# Patient Record
Sex: Female | Born: 1965
Health system: Southern US, Community
[De-identification: ages and names within clinical notes are randomized; demographics above are authoritative.]

## PROBLEM LIST (undated history)

## (undated) DIAGNOSIS — F32A Depression, unspecified: Secondary | ICD-10-CM

## (undated) DIAGNOSIS — F329 Major depressive disorder, single episode, unspecified: Secondary | ICD-10-CM

## (undated) DIAGNOSIS — T7840XA Allergy, unspecified, initial encounter: Secondary | ICD-10-CM

## (undated) DIAGNOSIS — L940 Localized scleroderma [morphea]: Secondary | ICD-10-CM

## (undated) DIAGNOSIS — K760 Fatty (change of) liver, not elsewhere classified: Secondary | ICD-10-CM

## (undated) DIAGNOSIS — M5030 Other cervical disc degeneration, unspecified cervical region: Secondary | ICD-10-CM

## (undated) DIAGNOSIS — E559 Vitamin D deficiency, unspecified: Secondary | ICD-10-CM

## (undated) DIAGNOSIS — D126 Benign neoplasm of colon, unspecified: Secondary | ICD-10-CM

## (undated) DIAGNOSIS — R7303 Prediabetes: Secondary | ICD-10-CM

## (undated) DIAGNOSIS — M797 Fibromyalgia: Secondary | ICD-10-CM

## (undated) DIAGNOSIS — K219 Gastro-esophageal reflux disease without esophagitis: Secondary | ICD-10-CM

## (undated) DIAGNOSIS — R569 Unspecified convulsions: Secondary | ICD-10-CM

## (undated) DIAGNOSIS — K5909 Other constipation: Secondary | ICD-10-CM

## (undated) DIAGNOSIS — E7800 Pure hypercholesterolemia, unspecified: Secondary | ICD-10-CM

## (undated) DIAGNOSIS — F419 Anxiety disorder, unspecified: Secondary | ICD-10-CM

## (undated) HISTORY — DX: Depression, unspecified: F32.A

## (undated) HISTORY — DX: Major depressive disorder, single episode, unspecified: F32.9

## (undated) HISTORY — DX: Anxiety disorder, unspecified: F41.9

## (undated) HISTORY — DX: Fatty (change of) liver, not elsewhere classified: K76.0

## (undated) HISTORY — DX: Allergy, unspecified, initial encounter: T78.40XA

## (undated) HISTORY — PX: BREAST SURGERY: SHX581

## (undated) HISTORY — DX: Other constipation: K59.09

## (undated) HISTORY — DX: Other cervical disc degeneration, unspecified cervical region: M50.30

## (undated) HISTORY — DX: Benign neoplasm of colon, unspecified: D12.6

## (undated) HISTORY — PX: PELVIC LAPAROSCOPY: SHX162

## (undated) HISTORY — DX: Vitamin D deficiency, unspecified: E55.9

## (undated) HISTORY — DX: Localized scleroderma (morphea): L94.0

## (undated) HISTORY — DX: Prediabetes: R73.03

## (undated) HISTORY — DX: Pure hypercholesterolemia, unspecified: E78.00

## (undated) HISTORY — DX: Gastro-esophageal reflux disease without esophagitis: K21.9

## (undated) HISTORY — PX: LAPAROSCOPIC CHOLECYSTECTOMY: SUR755

## (undated) HISTORY — DX: Unspecified convulsions: R56.9

## (undated) HISTORY — PX: TONSILLECTOMY AND ADENOIDECTOMY: SHX28

## (undated) HISTORY — PX: OTHER SURGICAL HISTORY: SHX169

## (undated) HISTORY — PX: VAGINAL HYSTERECTOMY: SUR661

## (undated) HISTORY — PX: LUMBAR DISC SURGERY: SHX700

## (undated) HISTORY — DX: Fibromyalgia: M79.7

---

## 1997-09-04 ENCOUNTER — Ambulatory Visit (HOSPITAL_COMMUNITY): Admission: RE | Admit: 1997-09-04 | Discharge: 1997-09-04 | Payer: Self-pay | Admitting: *Deleted

## 1998-05-15 ENCOUNTER — Emergency Department (HOSPITAL_COMMUNITY): Admission: EM | Admit: 1998-05-15 | Discharge: 1998-05-15 | Payer: Self-pay | Admitting: Emergency Medicine

## 1998-05-15 ENCOUNTER — Encounter: Payer: Self-pay | Admitting: Emergency Medicine

## 1998-05-24 ENCOUNTER — Emergency Department (HOSPITAL_COMMUNITY): Admission: EM | Admit: 1998-05-24 | Discharge: 1998-05-24 | Payer: Self-pay | Admitting: Emergency Medicine

## 1998-05-24 ENCOUNTER — Encounter: Payer: Self-pay | Admitting: Emergency Medicine

## 1998-08-23 ENCOUNTER — Emergency Department (HOSPITAL_COMMUNITY): Admission: EM | Admit: 1998-08-23 | Discharge: 1998-08-24 | Payer: Self-pay | Admitting: Emergency Medicine

## 1998-08-23 ENCOUNTER — Encounter: Payer: Self-pay | Admitting: Emergency Medicine

## 2000-03-07 ENCOUNTER — Encounter: Admission: RE | Admit: 2000-03-07 | Discharge: 2000-03-07 | Payer: Self-pay | Admitting: Family Medicine

## 2000-03-07 ENCOUNTER — Encounter: Payer: Self-pay | Admitting: Family Medicine

## 2001-10-26 ENCOUNTER — Encounter: Payer: Self-pay | Admitting: Gastroenterology

## 2001-11-28 ENCOUNTER — Other Ambulatory Visit: Admission: RE | Admit: 2001-11-28 | Discharge: 2001-11-28 | Payer: Self-pay | Admitting: Gynecology

## 2002-01-19 ENCOUNTER — Encounter: Payer: Self-pay | Admitting: Neurosurgery

## 2002-01-19 ENCOUNTER — Encounter: Admission: RE | Admit: 2002-01-19 | Discharge: 2002-01-19 | Payer: Self-pay | Admitting: Neurosurgery

## 2002-02-02 ENCOUNTER — Ambulatory Visit (HOSPITAL_COMMUNITY): Admission: RE | Admit: 2002-02-02 | Discharge: 2002-02-02 | Payer: Self-pay | Admitting: Gastroenterology

## 2002-02-02 ENCOUNTER — Encounter: Admission: RE | Admit: 2002-02-02 | Discharge: 2002-02-02 | Payer: Self-pay | Admitting: Neurosurgery

## 2002-02-02 ENCOUNTER — Encounter: Payer: Self-pay | Admitting: Neurosurgery

## 2002-02-02 ENCOUNTER — Encounter: Payer: Self-pay | Admitting: Gastroenterology

## 2002-10-26 ENCOUNTER — Encounter (INDEPENDENT_AMBULATORY_CARE_PROVIDER_SITE_OTHER): Payer: Self-pay | Admitting: *Deleted

## 2002-10-26 ENCOUNTER — Ambulatory Visit (HOSPITAL_BASED_OUTPATIENT_CLINIC_OR_DEPARTMENT_OTHER): Admission: RE | Admit: 2002-10-26 | Discharge: 2002-10-26 | Payer: Self-pay | Admitting: Otolaryngology

## 2002-12-03 ENCOUNTER — Other Ambulatory Visit: Admission: RE | Admit: 2002-12-03 | Discharge: 2002-12-03 | Payer: Self-pay | Admitting: Gynecology

## 2002-12-21 ENCOUNTER — Encounter: Payer: Self-pay | Admitting: General Surgery

## 2002-12-28 ENCOUNTER — Observation Stay (HOSPITAL_COMMUNITY): Admission: RE | Admit: 2002-12-28 | Discharge: 2002-12-29 | Payer: Self-pay | Admitting: General Surgery

## 2002-12-28 ENCOUNTER — Encounter (INDEPENDENT_AMBULATORY_CARE_PROVIDER_SITE_OTHER): Payer: Self-pay

## 2003-01-24 ENCOUNTER — Emergency Department (HOSPITAL_COMMUNITY): Admission: AD | Admit: 2003-01-24 | Discharge: 2003-01-24 | Payer: Self-pay | Admitting: Emergency Medicine

## 2003-07-02 ENCOUNTER — Inpatient Hospital Stay (HOSPITAL_COMMUNITY): Admission: RE | Admit: 2003-07-02 | Discharge: 2003-07-04 | Payer: Self-pay | Admitting: Gynecology

## 2003-07-02 ENCOUNTER — Encounter (INDEPENDENT_AMBULATORY_CARE_PROVIDER_SITE_OTHER): Payer: Self-pay | Admitting: *Deleted

## 2003-12-08 ENCOUNTER — Encounter: Admission: RE | Admit: 2003-12-08 | Discharge: 2003-12-08 | Payer: Self-pay | Admitting: Neurosurgery

## 2003-12-23 ENCOUNTER — Encounter: Admission: RE | Admit: 2003-12-23 | Discharge: 2003-12-23 | Payer: Self-pay | Admitting: Neurosurgery

## 2004-01-06 ENCOUNTER — Encounter: Admission: RE | Admit: 2004-01-06 | Discharge: 2004-01-06 | Payer: Self-pay | Admitting: Neurosurgery

## 2004-01-31 ENCOUNTER — Ambulatory Visit (HOSPITAL_COMMUNITY): Admission: RE | Admit: 2004-01-31 | Discharge: 2004-01-31 | Payer: Self-pay | Admitting: Urology

## 2004-05-19 ENCOUNTER — Other Ambulatory Visit: Admission: RE | Admit: 2004-05-19 | Discharge: 2004-05-19 | Payer: Self-pay | Admitting: Gynecology

## 2004-10-17 ENCOUNTER — Emergency Department (HOSPITAL_COMMUNITY): Admission: EM | Admit: 2004-10-17 | Discharge: 2004-10-17 | Payer: Self-pay | Admitting: Emergency Medicine

## 2005-05-03 ENCOUNTER — Ambulatory Visit (HOSPITAL_COMMUNITY): Admission: RE | Admit: 2005-05-03 | Discharge: 2005-05-03 | Payer: Self-pay | Admitting: Neurosurgery

## 2005-05-20 ENCOUNTER — Other Ambulatory Visit: Admission: RE | Admit: 2005-05-20 | Discharge: 2005-05-20 | Payer: Self-pay | Admitting: Gynecology

## 2005-06-17 ENCOUNTER — Encounter: Admission: RE | Admit: 2005-06-17 | Discharge: 2005-06-17 | Payer: Self-pay | Admitting: Neurosurgery

## 2005-10-22 ENCOUNTER — Encounter: Admission: RE | Admit: 2005-10-22 | Discharge: 2005-10-22 | Payer: Self-pay | Admitting: Neurosurgery

## 2006-04-03 ENCOUNTER — Emergency Department (HOSPITAL_COMMUNITY): Admission: EM | Admit: 2006-04-03 | Discharge: 2006-04-03 | Payer: Self-pay | Admitting: Emergency Medicine

## 2006-04-20 ENCOUNTER — Encounter: Payer: Self-pay | Admitting: Internal Medicine

## 2006-06-01 ENCOUNTER — Other Ambulatory Visit: Admission: RE | Admit: 2006-06-01 | Discharge: 2006-06-01 | Payer: Self-pay | Admitting: Gynecology

## 2006-06-14 HISTORY — PX: BACK SURGERY: SHX140

## 2006-11-09 ENCOUNTER — Encounter: Payer: Self-pay | Admitting: Internal Medicine

## 2006-11-14 ENCOUNTER — Inpatient Hospital Stay (HOSPITAL_COMMUNITY): Admission: RE | Admit: 2006-11-14 | Discharge: 2006-11-16 | Payer: Self-pay | Admitting: Neurosurgery

## 2007-06-14 ENCOUNTER — Other Ambulatory Visit: Admission: RE | Admit: 2007-06-14 | Discharge: 2007-06-14 | Payer: Self-pay | Admitting: Gynecology

## 2007-07-13 ENCOUNTER — Encounter: Admission: RE | Admit: 2007-07-13 | Discharge: 2007-07-13 | Payer: Self-pay | Admitting: Neurosurgery

## 2007-07-21 ENCOUNTER — Encounter: Admission: RE | Admit: 2007-07-21 | Discharge: 2007-07-21 | Payer: Self-pay | Admitting: Neurosurgery

## 2007-09-15 ENCOUNTER — Ambulatory Visit: Payer: Self-pay | Admitting: Internal Medicine

## 2007-09-15 DIAGNOSIS — E785 Hyperlipidemia, unspecified: Secondary | ICD-10-CM | POA: Insufficient documentation

## 2007-09-15 DIAGNOSIS — R109 Unspecified abdominal pain: Secondary | ICD-10-CM | POA: Insufficient documentation

## 2007-09-19 ENCOUNTER — Encounter (INDEPENDENT_AMBULATORY_CARE_PROVIDER_SITE_OTHER): Payer: Self-pay | Admitting: *Deleted

## 2007-09-19 ENCOUNTER — Telehealth: Payer: Self-pay | Admitting: Internal Medicine

## 2007-09-25 ENCOUNTER — Ambulatory Visit: Payer: Self-pay | Admitting: Cardiology

## 2007-09-25 ENCOUNTER — Telehealth (INDEPENDENT_AMBULATORY_CARE_PROVIDER_SITE_OTHER): Payer: Self-pay | Admitting: *Deleted

## 2007-10-05 ENCOUNTER — Encounter: Admission: RE | Admit: 2007-10-05 | Discharge: 2007-10-05 | Payer: Self-pay | Admitting: Neurosurgery

## 2007-10-31 ENCOUNTER — Encounter: Admission: RE | Admit: 2007-10-31 | Discharge: 2007-10-31 | Payer: Self-pay | Admitting: Neurosurgery

## 2007-11-23 ENCOUNTER — Ambulatory Visit: Payer: Self-pay | Admitting: Internal Medicine

## 2007-11-23 DIAGNOSIS — M542 Cervicalgia: Secondary | ICD-10-CM | POA: Insufficient documentation

## 2007-11-27 LAB — CONVERTED CEMR LAB
ALT: 21 units/L (ref 0–35)
AST: 18 units/L (ref 0–37)
Albumin: 4.2 g/dL (ref 3.5–5.2)
Alkaline Phosphatase: 48 units/L (ref 39–117)
Amylase: 55 units/L (ref 27–131)
BUN: 9 mg/dL (ref 6–23)
Basophils Absolute: 0 10*3/uL (ref 0.0–0.1)
Basophils Relative: 0.4 % (ref 0.0–1.0)
Bilirubin, Direct: 0.1 mg/dL (ref 0.0–0.3)
CO2: 26 meq/L (ref 19–32)
Calcium: 9.1 mg/dL (ref 8.4–10.5)
Chloride: 106 meq/L (ref 96–112)
Cholesterol: 202 mg/dL (ref 0–200)
Creatinine, Ser: 0.5 mg/dL (ref 0.4–1.2)
Direct LDL: 127 mg/dL
Eosinophils Absolute: 0.3 10*3/uL (ref 0.0–0.7)
Eosinophils Relative: 4.5 % (ref 0.0–5.0)
GFR calc Af Amer: 174 mL/min
GFR calc non Af Amer: 144 mL/min
Glucose, Bld: 94 mg/dL (ref 70–99)
HCT: 38.1 % (ref 36.0–46.0)
HDL: 38.1 mg/dL — ABNORMAL LOW (ref >39.0)
Hemoglobin: 13 g/dL (ref 12.0–15.0)
Lipase: 19 units/L (ref 11.0–59.0)
Lymphocytes Relative: 28.3 % (ref 12.0–46.0)
MCHC: 34.2 g/dL (ref 30.0–36.0)
MCV: 93.1 fL (ref 78.0–100.0)
Monocytes Absolute: 0.8 10*3/uL (ref 0.1–1.0)
Monocytes Relative: 12.6 % — ABNORMAL HIGH (ref 3.0–12.0)
Neutro Abs: 3.3 10*3/uL (ref 1.4–7.7)
Neutrophils Relative %: 54.2 % (ref 43.0–77.0)
Platelets: 287 10*3/uL (ref 150–400)
Potassium: 4.1 meq/L (ref 3.5–5.1)
RBC: 4.09 M/uL (ref 3.87–5.11)
RDW: 11.8 % (ref 11.5–14.6)
Sodium: 138 meq/L (ref 135–145)
Total Bilirubin: 0.7 mg/dL (ref 0.3–1.2)
Total CHOL/HDL Ratio: 5.3
Total CK: 108 units/L (ref 7–177)
Total Protein: 7.2 g/dL (ref 6.0–8.3)
Triglycerides: 224 mg/dL (ref 0–149)
VLDL: 45 mg/dL — ABNORMAL HIGH (ref 0–40)
WBC: 6.1 10*3/uL (ref 4.5–10.5)

## 2008-02-23 ENCOUNTER — Ambulatory Visit: Payer: Self-pay | Admitting: Internal Medicine

## 2008-02-23 DIAGNOSIS — R002 Palpitations: Secondary | ICD-10-CM | POA: Insufficient documentation

## 2008-02-29 ENCOUNTER — Ambulatory Visit: Payer: Self-pay | Admitting: Internal Medicine

## 2008-03-04 ENCOUNTER — Encounter (INDEPENDENT_AMBULATORY_CARE_PROVIDER_SITE_OTHER): Payer: Self-pay | Admitting: *Deleted

## 2008-03-04 LAB — CONVERTED CEMR LAB
Basophils Absolute: 0 10*3/uL (ref 0.0–0.1)
Basophils Relative: 0.4 % (ref 0.0–3.0)
Cholesterol: 253 mg/dL (ref 0–200)
Direct LDL: 143 mg/dL
Eosinophils Absolute: 0.1 10*3/uL (ref 0.0–0.7)
Eosinophils Relative: 1.2 % (ref 0.0–5.0)
HCT: 37.4 % (ref 36.0–46.0)
HDL: 50.7 mg/dL (ref >39.0)
Hemoglobin: 13.2 g/dL (ref 12.0–15.0)
Lymphocytes Relative: 37.3 % (ref 12.0–46.0)
MCHC: 35.2 g/dL (ref 30.0–36.0)
MCV: 90.8 fL (ref 78.0–100.0)
Monocytes Absolute: 0.6 10*3/uL (ref 0.1–1.0)
Monocytes Relative: 8 % (ref 3.0–12.0)
Neutro Abs: 4.3 10*3/uL (ref 1.4–7.7)
Neutrophils Relative %: 53.1 % (ref 43.0–77.0)
Platelets: 349 10*3/uL (ref 150–400)
RBC: 4.12 M/uL (ref 3.87–5.11)
RDW: 12.1 % (ref 11.5–14.6)
TSH: 1.25 microintl units/mL (ref 0.35–5.50)
Total CHOL/HDL Ratio: 5
Triglycerides: 205 mg/dL (ref 0–149)
VLDL: 41 mg/dL — ABNORMAL HIGH (ref 0–40)
WBC: 8 10*3/uL (ref 4.5–10.5)

## 2008-03-17 ENCOUNTER — Encounter: Admission: RE | Admit: 2008-03-17 | Discharge: 2008-03-17 | Payer: Self-pay | Admitting: Neurosurgery

## 2008-04-20 ENCOUNTER — Encounter: Admission: RE | Admit: 2008-04-20 | Discharge: 2008-04-20 | Payer: Self-pay | Admitting: Neurosurgery

## 2008-04-24 ENCOUNTER — Ambulatory Visit: Payer: Self-pay | Admitting: Internal Medicine

## 2008-04-29 ENCOUNTER — Encounter (INDEPENDENT_AMBULATORY_CARE_PROVIDER_SITE_OTHER): Payer: Self-pay | Admitting: *Deleted

## 2008-05-03 ENCOUNTER — Ambulatory Visit: Payer: Self-pay | Admitting: Cardiovascular Disease

## 2008-05-23 ENCOUNTER — Ambulatory Visit: Payer: Self-pay | Admitting: Family Medicine

## 2008-05-23 LAB — CONVERTED CEMR LAB
Bilirubin Urine: NEGATIVE
Glucose, Urine, Semiquant: NEGATIVE
Ketones, urine, test strip: NEGATIVE
Nitrite: NEGATIVE
Protein, U semiquant: NEGATIVE
Specific Gravity, Urine: 1.01
Urobilinogen, UA: 0.2
WBC Urine, dipstick: NEGATIVE
pH: 6

## 2008-05-24 ENCOUNTER — Ambulatory Visit: Payer: Self-pay | Admitting: Cardiology

## 2008-05-24 ENCOUNTER — Encounter: Payer: Self-pay | Admitting: Family Medicine

## 2008-05-27 ENCOUNTER — Telehealth (INDEPENDENT_AMBULATORY_CARE_PROVIDER_SITE_OTHER): Payer: Self-pay | Admitting: *Deleted

## 2008-05-27 LAB — CONVERTED CEMR LAB
ALT: 26 units/L (ref 0–35)
AST: 22 units/L (ref 0–37)
Albumin: 4.3 g/dL (ref 3.5–5.2)
Alkaline Phosphatase: 48 units/L (ref 39–117)
Amylase: 82 units/L (ref 27–131)
BUN: 8 mg/dL (ref 6–23)
Basophils Absolute: 0 10*3/uL (ref 0.0–0.1)
Basophils Relative: 0 % (ref 0.0–3.0)
Bilirubin, Direct: 0.1 mg/dL (ref 0.0–0.3)
CO2: 25 meq/L (ref 19–32)
Calcium: 9.2 mg/dL (ref 8.4–10.5)
Chloride: 104 meq/L (ref 96–112)
Creatinine, Ser: 0.4 mg/dL (ref 0.4–1.2)
Eosinophils Absolute: 0.1 10*3/uL (ref 0.0–0.7)
Eosinophils Relative: 1.1 % (ref 0.0–5.0)
GFR calc Af Amer: 225 mL/min
GFR calc non Af Amer: 186 mL/min
Glucose, Bld: 86 mg/dL (ref 70–99)
HCT: 36.9 % (ref 36.0–46.0)
Hemoglobin: 12.9 g/dL (ref 12.0–15.0)
Lipase: 32 units/L (ref 11.0–59.0)
Lymphocytes Relative: 29.6 % (ref 12.0–46.0)
MCHC: 35 g/dL (ref 30.0–36.0)
MCV: 92.1 fL (ref 78.0–100.0)
Monocytes Absolute: 0.5 10*3/uL (ref 0.1–1.0)
Monocytes Relative: 5.7 % (ref 3.0–12.0)
Neutro Abs: 5.3 10*3/uL (ref 1.4–7.7)
Neutrophils Relative %: 63.6 % (ref 43.0–77.0)
Platelets: 308 10*3/uL (ref 150–400)
Potassium: 4.1 meq/L (ref 3.5–5.1)
RBC: 4.01 M/uL (ref 3.87–5.11)
RDW: 11.7 % (ref 11.5–14.6)
Sodium: 136 meq/L (ref 135–145)
Total Bilirubin: 0.5 mg/dL (ref 0.3–1.2)
Total Protein: 7.5 g/dL (ref 6.0–8.3)
WBC: 8.4 10*3/uL (ref 4.5–10.5)

## 2008-06-04 ENCOUNTER — Encounter (INDEPENDENT_AMBULATORY_CARE_PROVIDER_SITE_OTHER): Payer: Self-pay | Admitting: *Deleted

## 2008-06-04 ENCOUNTER — Ambulatory Visit: Payer: Self-pay | Admitting: Gastroenterology

## 2008-06-19 ENCOUNTER — Ambulatory Visit: Payer: Self-pay | Admitting: Gastroenterology

## 2008-08-07 ENCOUNTER — Other Ambulatory Visit: Admission: RE | Admit: 2008-08-07 | Discharge: 2008-08-07 | Payer: Self-pay | Admitting: Gynecology

## 2008-08-07 ENCOUNTER — Ambulatory Visit: Payer: Self-pay | Admitting: Gynecology

## 2008-08-07 ENCOUNTER — Encounter: Payer: Self-pay | Admitting: Internal Medicine

## 2008-08-23 ENCOUNTER — Encounter: Payer: Self-pay | Admitting: Internal Medicine

## 2008-10-22 ENCOUNTER — Ambulatory Visit: Payer: Self-pay | Admitting: Internal Medicine

## 2008-10-22 DIAGNOSIS — M549 Dorsalgia, unspecified: Secondary | ICD-10-CM | POA: Insufficient documentation

## 2008-10-22 DIAGNOSIS — J309 Allergic rhinitis, unspecified: Secondary | ICD-10-CM | POA: Insufficient documentation

## 2008-10-29 ENCOUNTER — Encounter: Admission: RE | Admit: 2008-10-29 | Discharge: 2008-10-29 | Payer: Self-pay | Admitting: Neurosurgery

## 2008-10-29 LAB — CONVERTED CEMR LAB
ALT: 32 units/L (ref 0–35)
AST: 26 units/L (ref 0–37)
Cholesterol: 246 mg/dL — ABNORMAL HIGH (ref 0–200)
Direct LDL: 156.2 mg/dL
HDL: 44.6 mg/dL (ref >39.00)
Total CHOL/HDL Ratio: 6
Triglycerides: 254 mg/dL — ABNORMAL HIGH (ref 0.0–149.0)
VLDL: 50.8 mg/dL — ABNORMAL HIGH (ref 0.0–40.0)

## 2008-11-06 ENCOUNTER — Ambulatory Visit: Payer: Self-pay | Admitting: Internal Medicine

## 2008-11-06 DIAGNOSIS — K3189 Other diseases of stomach and duodenum: Secondary | ICD-10-CM | POA: Insufficient documentation

## 2008-11-06 DIAGNOSIS — R1013 Epigastric pain: Secondary | ICD-10-CM

## 2008-11-06 LAB — CONVERTED CEMR LAB
Bilirubin Urine: NEGATIVE
Blood in Urine, dipstick: NEGATIVE
Glucose, Urine, Semiquant: NEGATIVE
Ketones, urine, test strip: NEGATIVE
Nitrite: NEGATIVE
Protein, U semiquant: NEGATIVE
Specific Gravity, Urine: 1.02
Urobilinogen, UA: 0.2
WBC Urine, dipstick: NEGATIVE
pH: 5

## 2008-11-14 LAB — CONVERTED CEMR LAB
ALT: 33 units/L (ref 0–35)
AST: 29 units/L (ref 0–37)
Albumin: 4.3 g/dL (ref 3.5–5.2)
Alkaline Phosphatase: 54 units/L (ref 39–117)
BUN: 9 mg/dL (ref 6–23)
Basophils Absolute: 0 10*3/uL (ref 0.0–0.1)
Basophils Relative: 0.2 % (ref 0.0–3.0)
Bilirubin, Direct: 0.1 mg/dL (ref 0.0–0.3)
CO2: 29 meq/L (ref 19–32)
Calcium: 8.9 mg/dL (ref 8.4–10.5)
Chloride: 107 meq/L (ref 96–112)
Creatinine, Ser: 0.5 mg/dL (ref 0.4–1.2)
Eosinophils Absolute: 0.3 10*3/uL (ref 0.0–0.7)
Eosinophils Relative: 3 % (ref 0.0–5.0)
GFR calc non Af Amer: 142.9 mL/min (ref >60)
Glucose, Bld: 80 mg/dL (ref 70–99)
HCT: 38.8 % (ref 36.0–46.0)
Hemoglobin: 13.4 g/dL (ref 12.0–15.0)
Lymphocytes Relative: 40.8 % (ref 12.0–46.0)
Lymphs Abs: 3.7 10*3/uL (ref 0.7–4.0)
MCHC: 34.4 g/dL (ref 30.0–36.0)
MCV: 91.5 fL (ref 78.0–100.0)
Monocytes Absolute: 0.9 10*3/uL (ref 0.1–1.0)
Monocytes Relative: 10.4 % (ref 3.0–12.0)
Neutro Abs: 4.2 10*3/uL (ref 1.4–7.7)
Neutrophils Relative %: 45.6 % (ref 43.0–77.0)
Platelets: 364 10*3/uL (ref 150.0–400.0)
Potassium: 3.8 meq/L (ref 3.5–5.1)
RBC: 4.25 M/uL (ref 3.87–5.11)
RDW: 11.6 % (ref 11.5–14.6)
Sodium: 140 meq/L (ref 135–145)
Total Bilirubin: 0.6 mg/dL (ref 0.3–1.2)
Total Protein: 7.8 g/dL (ref 6.0–8.3)
WBC: 9.1 10*3/uL (ref 4.5–10.5)

## 2008-12-04 ENCOUNTER — Ambulatory Visit: Payer: Self-pay | Admitting: Internal Medicine

## 2008-12-04 DIAGNOSIS — J019 Acute sinusitis, unspecified: Secondary | ICD-10-CM | POA: Insufficient documentation

## 2008-12-10 ENCOUNTER — Ambulatory Visit: Payer: Self-pay | Admitting: Gastroenterology

## 2009-01-13 ENCOUNTER — Ambulatory Visit (HOSPITAL_COMMUNITY): Admission: RE | Admit: 2009-01-13 | Discharge: 2009-01-14 | Payer: Self-pay | Admitting: Neurosurgery

## 2009-06-23 ENCOUNTER — Emergency Department (HOSPITAL_COMMUNITY): Admission: EM | Admit: 2009-06-23 | Discharge: 2009-06-23 | Payer: Self-pay | Admitting: Emergency Medicine

## 2009-09-15 ENCOUNTER — Other Ambulatory Visit: Admission: RE | Admit: 2009-09-15 | Discharge: 2009-09-15 | Payer: Self-pay | Admitting: Gynecology

## 2009-09-15 ENCOUNTER — Ambulatory Visit: Payer: Self-pay | Admitting: Gynecology

## 2009-12-06 ENCOUNTER — Encounter: Admission: RE | Admit: 2009-12-06 | Discharge: 2009-12-06 | Payer: Self-pay | Admitting: Neurosurgery

## 2009-12-25 ENCOUNTER — Encounter: Admission: RE | Admit: 2009-12-25 | Discharge: 2009-12-25 | Payer: Self-pay | Admitting: Neurosurgery

## 2010-07-05 ENCOUNTER — Encounter: Payer: Self-pay | Admitting: Neurosurgery

## 2010-07-12 LAB — CONVERTED CEMR LAB
BUN: 11 mg/dL
CO2, serum: 23 mmol/L
Chloride, Serum: 104 mmol/L
Creatinine, Ser: 0.58 mg/dL
Hemoglobin: 12.5 g/dL
Platelets: 416 10*3/uL
Potassium, serum: 4.7 mmol/L
RBC count: 4.39 10*6/uL
Sodium, serum: 140 mmol/L
WBC, blood: 7.1 10*3/uL

## 2010-07-16 NOTE — Assessment & Plan Note (Signed)
Summary: acute only for abdominal and back pain//ph   Vital Signs:  Patient Profile:   45 Years Old Female Height:     60 inches Weight:      160.4 pounds Pulse rate:   90 / minute BP sitting:   126 / 80  (left arm)  Vitals Entered By: Doristine Devoid (May 23, 2008 3:05 PM)                 Chief Complaint:  abdominal pain and lower back- no burning or frequency.  History of Present Illness: 45 yo woman w/ abd pain for >1 yr.  Pain is located around the umbilicus and on either side.  Pain is getting worse  Occasional the pain goes through to the lower back.  Pain in the back is described as a throbbing.  Abdominal pain is described as a pressure, worse when pt pushes on abdomen.  + bloating.  No gas.  Regular BMs, last this AM.  No N/V.  the pain and bloating worsens w/ eating.  Is not relieved w/ BM or passing gas.   No gallbladder or uterus.  No dysuria, hematuria, frequency, urgency.  Pt felt that there was burning in her throat the other day- this resolved spontaneously.  Pain is worse after spicy foods.  Pt worried b/c sister has colon cancer (age 61) and sxs are becoming constant and more severe.    Current Allergies (reviewed today): No known allergies   Past Medical History:    Reviewed history from 05/03/2008 and no changes required:       G2 P2       Hyperlipidemia       ?neck mass--CT 4-09 neg       Palpitations   Family History:    CAD - no    HTN - no    DM - no    Colon Ca - sister (age 53)    breast Ca - no    stroke - F    Mother-alive and well    Father-died at 45yo, unsure of cause    Healthy brother and sister    no premature CAD or SCD in family    Review of Systems  The patient denies anorexia, fever, weight loss, chest pain, dyspnea on exertion, peripheral edema, melena, hematochezia, severe indigestion/heartburn, and enlarged lymph nodes.     Physical Exam  General:     alert and well-developed.   Neck:     no thyromegaly.     Lungs:     normal respiratory effort, no intercostal retractions, no accessory muscle use, and normal breath sounds.   Heart:     normal rate, regular rhythm, and no murmur.   Abdomen:     soft, TTP in periumbilical region.  no guarding or rebound.  + BS.  no CVA tenderness    Impression & Recommendations:  Problem # 1:  ABDOMINAL PAIN (ICD-789.00) Assessment: Deteriorated Pt w/ persistant sxs for >1 yr, worsening in frequency and severity.  Will check labs to R/O pancreatitis or hepatic dz.  Will refer to GI given sister's current dx of colon CA at age 12.  Will also do CT scan.  red flags given to pt that should prompt return.  Pt expresses understanding and is in agreement w/ this plan. Orders: UA Dipstick w/o Micro (manual) (16109) Specimen Handling (99000) Venipuncture (60454) T-Culture, Urine (09811-91478) TLB-BMP (Basic Metabolic Panel-BMET) (80048-METABOL) TLB-CBC Platelet - w/Differential (85025-CBCD) TLB-Hepatic/Liver Function Pnl (80076-HEPATIC) TLB-Amylase (82150-AMYL)  TLB-Lipase (83690-LIPASE) Radiology Referral (Radiology) Gastroenterology Referral (GI)   Complete Medication List: 1)  Vitamin D 50,000 Iu  .Marland Kitchen.. 1 by mouth once daily x 12 weeks 2)  Caltrate 600 1500 Mg Tabs (Calcium carbonate) 3)  Hydrocodone-acetaminophen 5-500 Mg Tabs (Hydrocodone-acetaminophen) .... Per dr cram 4)  Omeprazole 40 Mg Cpdr (Omeprazole) .Marland Kitchen.. 1 tab by mouth daily   Patient Instructions: 1)  Please schedule a follow-up appointment in 2 weeks with Dr Drue Novel. 2)  Take the omeprazole daily to decrease your stomach acid 3)  Increase your fiber intake- fruits, leafy green vegetables, whole grains 4)  Someone will call you to arrange your CT scan and your GI appt 5)  We will notify you of your lab results 6)  Call with any questions or concerns   Prescriptions: OMEPRAZOLE 40 MG CPDR (OMEPRAZOLE) 1 tab by mouth daily  #30 x 1   Entered and Authorized by:   Neena Rhymes MD   Signed  by:   Neena Rhymes MD on 05/23/2008   Method used:   Print then Give to Patient   RxID:   [id number]  ] Laboratory Results   Urine Tests    Routine Urinalysis   Glucose: negative   (Normal Range: Negative) Bilirubin: negative   (Normal Range: Negative) Ketone: negative   (Normal Range: Negative) Spec. Gravity: 1.010   (Normal Range: 1.003-1.035) Blood: trace-intact   (Normal Range: Negative) pH: 6.0   (Normal Range: 5.0-8.0) Protein: negative   (Normal Range: Negative) Urobilinogen: 0.2   (Normal Range: 0-1) Nitrite: negative   (Normal Range: Negative) Leukocyte Esterace: negative   (Normal Range: Negative)

## 2010-07-16 NOTE — Assessment & Plan Note (Signed)
Summary: 2 MONTH ROA///SPH   Vital Signs:  Patient Profile:   45 Years Old Female Height:     60 inches Weight:      159.6 pounds Temp:     99.2 degrees F Pulse rate:   100 / minute BP sitting:   130 / 80  Vitals Entered By: Shary Decamp (April 24, 2008 3:01 PM)                 Chief Complaint:  ROV.  History of Present Illness: f/u from last OV ---cont. w/ palpitations as before ---cont.  w/ "feeling hot" on-off ---L shoulder pain x 3 days,change w/ certain movements,no injury ---has a lesion at L mouth    Updated Prior Medication List: * VITAMIN D 50,000 IU 1 by mouth once daily x 12 weeks CALTRATE 600 1500 MG  TABS (CALCIUM CARBONATE)  HYDROCODONE-ACETAMINOPHEN 5-500 MG TABS (HYDROCODONE-ACETAMINOPHEN) PER DR CRAM  Current Allergies (reviewed today): No known allergies   Past Medical History:    Reviewed history from 11/23/2007 and no changes required:       G2 P2       Hyperlipidemia       ?neck mass--CT 4-09 neg  Past Surgical History:    Reviewed history from 02/23/2008 and no changes required:       Hysterectomy, no oophorectomy       Lumbar laminectomy--2006 and revised // fusion2008 Dr Wynetta Emery       Tubal ligation       Tonsillectomy       breast reduction       Cholecystectomy--2004     Review of Systems  General      cont. w/ wt gain   Physical Exam  General:     alert and well-developed.   Mouth:     2 mm white patch in the internal side of the L cheek Lungs:     normal respiratory effort, no intercostal retractions, no accessory muscle use, and normal breath sounds.   Heart:     normal rate, regular rhythm, and no murmur.   Msk:     shoulders: no deformities, L shoulder w/ ROM normal but painful to abeduction Extremities:     no pretibial edema bilaterally     Impression & Recommendations:  Problem # 1:  PALPITATIONS (ICD-785.1) cont. w/ palpitation to cards, likely will need a ECHO, ?holter  recent CBC and TSH were  neg also rec to talk w/  gyn ref hot flashes Orders: Cardiology Referral (Cardiology)   Problem # 2:  aphtous ulcer:  rec observation  Problem # 3:  HYPERLIPIDEMIA (ICD-272.4) admits to poor life style, we discussed diet and exercise extensively declined nutritionist referal RTC  6 months  Problem # 4:  Shoulder pain  see instructions   Complete Medication List: 1)  Vitamin D 50,000 Iu  .Marland Kitchen.. 1 by mouth once daily x 12 weeks 2)  Caltrate 600 1500 Mg Tabs (Calcium carbonate) 3)  Hydrocodone-acetaminophen 5-500 Mg Tabs (Hydrocodone-acetaminophen) .... Per dr cram   Patient Instructions: 1)  Take 400-600mg  of Ibuprofen (Advil, Motrin) with food every 4-6 hours as needed for relief of pain . Stop ibuprofen if it irritate your stomach   ]

## 2010-07-16 NOTE — Letter (Signed)
Summary: Primary Care Consult Scheduled Letter  [facility]  [address] [place], [place] [address]   Phone: [phone]  Fax: [phone]      04/29/2008 MRN: [id number]  [name] [address] [place], [place]  [address]    Dear [name],      We have scheduled an appointment for you.  At the recommendation of Dr.Paz, we have scheduled you a consult with Dr. Clifton James at [facility] on November 20th at 11:30am.  Their address is [address] [place]. [address] [place]. The office phone number is [phone].  If this appointment day and time is not convenient for you, please feel free to call the office of the doctor you are being referred to at the number listed above and reschedule the appointment.     It is important for you to keep your scheduled appointments. We are here to make sure you are given good patient care. If you have questions or you have made changes to your appointment, please notify us at  [phone], ask for Tiffany.    Thank you,  Patient Care Coordinator [place] [facility]

## 2010-07-16 NOTE — Progress Notes (Signed)
Summary: labs and ct results-lmom  Phone Note Outgoing Call   Call placed by: Doristine Devoid,  May 27, 2008 3:29 PM Call placed to: Patient Summary of Call: please notify pt that CT is WNL- no areas of concern or anything that would explain her abdominal pain.  labs normal..................Marland KitchenDoristine Devoid  May 27, 2008 3:30 PM   left msg w/ female to have return call says to call around 3:30.......Marland KitchenDoristine Devoid  May 29, 2008 10:31 AM   Follow-up for Phone Call        mailed letter.........Marland KitchenDoristine Devoid  June 04, 2008 1:10 PM

## 2010-07-16 NOTE — Assessment & Plan Note (Signed)
Summary: RO4--6 MONTH OV//PH   Vital Signs:  Patient profile:   45 year old female Height:      60 inches Weight:      161.8 pounds BMI:     31.71 Pulse rate:   94 / minute Pulse rhythm:   regular BP sitting:   110 / 60  (left arm) Cuff size:   regular  Vitals Entered By: Shary Decamp (Oct 22, 2008 11:13 AM)  History of Present Illness: ROV f/u on cholesterol chart reviewed , PMH updated   Current Medications (verified): 1)  Vitamin D 50,000 Iu .Marland Kitchen.. 1 By Mouth Once Daily X 12 Weeks 2)  Caltrate 600 1500 Mg  Tabs (Calcium Carbonate) 3)  Hydrocodone-Acetaminophen 5-500 Mg Tabs (Hydrocodone-Acetaminophen) .... Per Dr Wynetta Emery 4)  Omeprazole 40 Mg Cpdr (Omeprazole) .Marland Kitchen.. 1 Tab By Mouth Daily  Allergies (verified): No Known Drug Allergies  Past History:  Past Medical History:    G2 P2    Hyperlipidemia    ?neck mass--CT 4-09 neg    Palpitations-- saw cards 11-09, they ordered a ECHO and a Holter (done?)    Cscope 1-10 Normal, repeat 2015 given FH    Fatty liver-mild per CT 12-09    Allergic rhinitis    Chronic LBP (Dr Barco--Dr Wynetta Emery)  Social History:    Reviewed history from 05/03/2008 and no changes required:       Married       2 kids       from [place]       No tobacco        No etoh       No illicit drugs       1-2 caffeinated beverages per day  Review of Systems       since last OV -- diet is no better, unableto exercise d/t chronic LBP labs and CT from 12-09 reviewed and d/w patient  also x 2-3 weeks c/o occ wheezing and SOB. In previous years she was treated w/  ventolin, patient thinks symptoms related to polen  denies fever or cough  Physical Exam  General:  alert and well-developed.   Ears:  R ear normal and L ear normal.   Nose:  some congestion Lungs:  normal respiratory effort, no intercostal retractions, and no accessory muscle use.  few ronchi w/  cough, no wheezing  Heart:  normal rate, regular rhythm, and no murmur.     Impression &  Recommendations:  Problem # 1:  HYPERLIPIDEMIA (ICD-272.4) labs, again encouraged healthy life style see previous notes, likes a generic ?pravachol (needs a 3 month supply,  will not send Rx to local  pharmacy, patient will pick it up) Orders: TLB-ALT (SGPT) (84460-ALT) TLB-AST (SGOT) (84450-SGOT) TLB-Lipid Panel (80061-LIPID) Venipuncture (14481)  Problem # 2:  ALLERGIC RHINITIS (ICD-477.9)  rec OTC Claritin also ventolin to be used as needed   Her updated medication list for this problem includes:    Claritin 10 Mg Tabs (Loratadine) .Marland Kitchen... 1 tab a day (otc) as needed for allergies  Complete Medication List: 1)  Vitamin D 50,000 Iu  .Marland Kitchen.. 1 by mouth once daily x 12 weeks 2)  Caltrate 600 1500 Mg Tabs (Calcium carbonate) 3)  Opana 10 Mg Tabs (Oxymorphone hcl) .Marland Kitchen.. 1-2 q6h per dr Abigail Miyamoto 4)  Omeprazole 40 Mg Cpdr (Omeprazole) .Marland Kitchen.. 1 tab by mouth daily 5)  Claritin 10 Mg Tabs (Loratadine) .Marland Kitchen.. 1 tab a day (otc) as needed for allergies 6)  Ventolin Hfa 108 (90 Base)  Mcg/act Aers (Albuterol sulfate) .... 2 puffs every 6 hours as needed for wheezing  Patient Instructions: 1)  Please schedule a follow-up appointment in 6 months .  Prescriptions: VENTOLIN HFA 108 (90 BASE) MCG/ACT AERS (ALBUTEROL SULFATE) 2 puffs every 6 hours as needed for wheezing  #1 x 1   Entered and Authorized by:   Nolon Rod. Zechariah Bissonnette MD   Signed by:   Nolon Rod. Mallary Kreger MD on 10/22/2008   Method used:   Print then Give to Patient   RxID:   [id number]

## 2010-07-16 NOTE — Assessment & Plan Note (Signed)
Summary: new patient to est care--tl   Vital Signs:  Patient Profile:   45 Years Old Female Height:     60 inches Weight:      157.6 pounds BMI:     30.89 Pulse rate:   80 / minute BP sitting:   102 / 80  Vitals Entered By: Shary Decamp (September 15, 2007 9:41 AM)             Comments had lipid panel recently @ [facility] - cholesterol was elevated hx kidney stones - c/o abd pain - had u/s at [facility] ..................................................................Marland KitchenShary Decamp  September 15, 2007 9:49 AM      Chief Complaint:  new pt to est - not fasting.  History of Present Illness: New pt had lipid panel 3-08 @ [facility] - cholesterol was elevated was rec to continue w/ simvastatin and add O3FA started simvastatin aprox 7-08  c/o abd pain x 1 year peri-umbilical, no assoc N-V-D, no wt. loss (has gain wt) worse w/ fatty food and to self-palpation (had GB surgery 2004 aprox) b/c hx kidney stones, previous PCP ordered a U/S x2 at [facility]  has a Knot at L neck x 3 months, slt tender, previous PCP Rx ibuprofen--no help    Updated Prior Medication List: SIMVASTATIN 80 MG  TABS (SIMVASTATIN) 1 by mouth qhs * VITAMIN D 50,000 IU 1 by mouth once daily x 12 weeks CALTRATE 600 1500 MG  TABS (CALCIUM CARBONATE)  PERCOCET 5-325 MG  TABS (OXYCODONE-ACETAMINOPHEN) 1-2 by mouth every 4-6 hours * FISH OIL   Current Allergies (reviewed today): No known allergies   Past Medical History:    G2 P2    Hyperlipidemia  Past Surgical History:    Hysterectomy    Lumbar laminectomy--2006 and revised // fusion2008 Dr Wynetta Emery    Tubal ligation    Tonsillectomy    breast reduction    Cholecystectomy--2004   Family History:    CAD - no    HTN - no    DM - no    Colon Ca - no    breast Ca - no    stroke - F  Social History:    Married    2 kids    from [place]   Risk Factors:  Tobacco use:  never Passive smoke exposure:  no Drug use:  no Alcohol use:  no Exercise:  yes  Times per week:  2   Review of Systems  ENT      no dental pain  GI      Denies bloody stools, diarrhea, nausea, and vomiting.  GU      Denies dysuria and hematuria.   Physical Exam  General:     alert and well-developed.   Eyes:     not pale Mouth:     good dentition and pharynx pink and moist.   Neck:     at R side of necl has a soft 3x2 cm mass, suoerficial Lungs:     normal respiratory effort, no intercostal retractions, no accessory muscle use, and normal breath sounds.   Heart:     normal rate, regular rhythm, and no murmur.   Abdomen:       generalize mild abd dyscomfort to plapation soft, no distention, no masses, no hepatomegaly, and no splenomegaly.      Impression & Recommendations:  Problem # 1:  HYPERLIPIDEMIA (ICD-272.4) get recors d/c O3FA RTC fasting in 2 months Her updated medication list for this problem includes:  Simvastatin 80 Mg Tabs (Simvastatin) .Marland Kitchen... 1 by mouth qhs   Problem # 2:  ABDOMINAL PAIN (ICD-789.00) etilogy? dyspepsia? obtain records   Problem # 3:  NECK MASS (ICD-784.2) mass x 3 months, area is soft b/c is persisten: will get a CT Orders: Radiology Referral (Radiology)   Complete Medication List: 1)  Simvastatin 80 Mg Tabs (Simvastatin) .Marland Kitchen.. 1 by mouth qhs 2)  Vitamin D 50,000 Iu  .Marland Kitchen.. 1 by mouth once daily x 12 weeks 3)  Caltrate 600 1500 Mg Tabs (Calcium carbonate) 4)  Percocet 5-325 Mg Tabs (Oxycodone-acetaminophen) .Marland Kitchen.. 1-2 by mouth every 4-6 hours 5)  Fish Oil   Other Orders: Tdap => 33yrs IM ([address]) Admin 1st Vaccine (09811)   Patient Instructions: 1)  ROI: labs forlas year, all XR-U/S 2)  Please schedule a follow-up appointment in 2 months.    ]  Tetanus/Td Vaccine    Vaccine Type: Tdap    Site: right deltoid    Dose: 0.5 ml    Route: IM    Given by: Shary Decamp    Exp. Date: 08/31/2009    Lot #: [id number]

## 2010-07-16 NOTE — Miscellaneous (Signed)
Summary: [facility] Previsit/prep  Clinical Lists Changes  Medications: Added new medication of MOVIPREP 100 GM  SOLR (PEG-KCL-NACL-NASULF-NA ASC-C) As per prep instructions. - Signed Rx of MOVIPREP 100 GM  SOLR (PEG-KCL-NACL-NASULF-NA ASC-C) As per prep instructions.;  #1 x 0;  Signed;  Entered by: Wyona Almas RN;  Authorized by: Louis Meckel MD;  Method used: Electronically to [id number] [address] [place], [place]  [address], Ph: [phone], Fax: [phone] Observations: Added new observation of NKA: T (06/04/2008 13:31)    Prescriptions: MOVIPREP 100 GM  SOLR (PEG-KCL-NACL-NASULF-NA ASC-C) As per prep instructions.  #1 x 0   Entered by:   Wyona Almas RN   Authorized by:   Louis Meckel MD   Signed by:   Wyona Almas RN on 06/04/2008   Method used:   Electronically to        [facility] [id number] (retail)       [address] [place], [place]  [address]       Ph: [phone]       Fax: [phone]   RxID:   [id number]

## 2010-07-16 NOTE — Assessment & Plan Note (Signed)
Summary: roa 3 months/cbs   Vital Signs:  Patient Profile:   45 Years Old Female Height:     60 inches Weight:      155.6 pounds Pulse rate:   100 / minute BP sitting:   112 / 74  Vitals Entered By: Shary Decamp (February 23, 2008 3:36 PM)                 Chief Complaint:  discuss simvastatin.  History of Present Illness: -- see last OV...pt d/c simvastatin and felt great, did not go back --also c/o plapitations: x several months, episodes last 1 hour,  described as "pulse going fast", assoc. w/  "feeling hot" type of sensation  and anxiety; has not notice any particular triggers, no new meds    Prior Medication List:  SIMVASTATIN 80 MG  TABS (SIMVASTATIN) 1 by mouth qhs * VITAMIN D 50,000 IU 1 by mouth once daily x 12 weeks CALTRATE 600 1500 MG  TABS (CALCIUM CARBONATE)  TRAMADOL HCL 50 MG  TABS (TRAMADOL HCL) 1-2 by mouth every 6 hours as needed pain   Current Allergies (reviewed today): No known allergies   Past Medical History:    Reviewed history from 11/23/2007 and no changes required:       G2 P2       Hyperlipidemia       ?neck mass--CT 4-09 neg  Past Surgical History:    Reviewed history from 09/15/2007 and no changes required:       Hysterectomy, no oophorectomy       Lumbar laminectomy--2006 and revised // fusion2008 Dr Wynetta Emery       Tubal ligation       Tonsillectomy       breast reduction       Cholecystectomy--2004     Review of Systems  CV      Denies chest pain or discomfort and swelling of feet.  Resp      Denies shortness of breath.  GU      h/o hysterectomy  Neuro      Denies headaches, seizures, and visual disturbances.      no syncope   Physical Exam  General:     alert and well-developed.   Eyes:     not pale or icteric Neck:     no thyromegaly.   Lungs:     normal respiratory effort, no intercostal retractions, no accessory muscle use, and normal breath sounds.   Heart:     normal rate, regular rhythm, no murmur,  and no gallop.   Abdomen:     soft, non-tender, no hepatomegaly, and no splenomegaly.   Extremities:     no pretibial edema bilaterally  Neurologic:     alert & oriented X3, strength normal in all extremities, and gait normal.   Psych:     Oriented X3, normally interactive, good eye contact, not anxious appearing, and not depressed appearing.      Impression & Recommendations:  Problem # 1:  HYPERLIPIDEMIA (ICD-272.4) long h/o hyperlipidemia before, lipitor caused hair to fall. Now simvastatin causes aches plan: labs to get a baseline (last FLP was while pt on meds and currently off meds x > 2 months) likely will benefit from fenofibrate +/- pravachol Her updated medication list for this problem includes:    Simvastatin 80 Mg Tabs (Simvastatin) .Marland Kitchen... 1 by mouth qhs   Problem # 2:  PALPITATIONS (ICD-785.1) Assessment: New EKG NSR, HR high normal DDX-- thyroid dz, ?menopause, anxiety, other  labs re-asses on RTC, w/u? Orders: EKG w/ Interpretation (93000)   Complete Medication List: 1)  Simvastatin 80 Mg Tabs (Simvastatin) .Marland Kitchen.. 1 by mouth qhs 2)  Vitamin D 50,000 Iu  .Marland Kitchen.. 1 by mouth once daily x 12 weeks 3)  Caltrate 600 1500 Mg Tabs (Calcium carbonate) 4)  Tramadol Hcl 50 Mg Tabs (Tramadol hcl) .Marland Kitchen.. 1-2 by mouth every 6 hours as needed pain   Patient Instructions: 1)  came back fasting: 2)  FLP Dx high cholesterol 3)  CBC TSH Dx palpitations 4)  Please schedule a follow-up appointment in 2 months.   ]  Appended Document: med list updated    Clinical Lists Changes  Medications: Removed medication of SIMVASTATIN 80 MG  TABS (SIMVASTATIN) 1 by mouth qhs

## 2010-07-16 NOTE — Miscellaneous (Signed)
Summary: labs from gyn  labs scanned into emr [facility]  August 23, 2008 4:39 PM  Clinical Lists Changes  Observations: Added new observation of PLATELETS: 416 10*3/mm3 (08/07/2008 16:38) Added new observation of HGB: 12.5 g/dL (04/54/0981 19:14) Added new observation of RBC: 4.39 10*6/mm3 (08/07/2008 16:38) Added new observation of WBC: 7.1 10*3/mm3 (08/07/2008 16:38) Added new observation of CREATININE: 0.58 mg/dL (78/29/5621 30:86) Added new observation of BUN: 11 mg/dL (57/84/6962 95:28) Added new observation of CO2 TOTAL: 23 mmol/L (08/07/2008 16:38) Added new observation of CHLORIDE: 104 mmol/L (08/07/2008 16:38) Added new observation of POTASSIUM: 4.7 mmol/L (08/07/2008 16:38) Added new observation of SODIUM: 140 mmol/L (08/07/2008 16:38)

## 2010-07-16 NOTE — Progress Notes (Signed)
Summary: ref: previous labs, U/S and CT  Phone Note Outgoing Call   Summary of Call: advise pt: 1.-CT of the neck ok, no further tests 2.- I have review her previos u/s and labs. plan is the same : RTC fasting in 2 months....................................................................Jose E. Paz MD  September 25, 2007 1:02 PM   Follow-up for Phone Call        no answer, no machine ..................................................................Marland KitchenDoristine Devoid  September 25, 2007 2:46 PM   patient aware of results ..................................................................Marland KitchenDoristine Devoid  September 25, 2007 2:50 PM

## 2010-07-16 NOTE — Assessment & Plan Note (Signed)
Summary: fasting--tl   Vital Signs:  Patient Profile:   45 Years Old Female Height:     60 inches Weight:      160 pounds Pulse rate:   80 / minute BP sitting:   100 / 70  Vitals Entered By: Shary Decamp (November 23, 2007 9:26 AM)                 Chief Complaint:  rov - fasting.  History of Present Illness: f/u cholesterol also c/o neck pain, saw Dr Wynetta Emery , he ordered a MRI, has disk problem...on PT also c/o muscle aches all over, ?worse since she started simvastatin also c/o generalise abd pain: sx started in 2007, sx is described as tender everywhere when she puts  pressure on the abdomen previous w/u neg    Updated Prior Medication List: SIMVASTATIN 80 MG  TABS (SIMVASTATIN) 1 by mouth qhs * VITAMIN D 50,000 IU 1 by mouth once daily x 12 weeks CALTRATE 600 1500 MG  TABS (CALCIUM CARBONATE)  TRAMADOL HCL 50 MG  TABS (TRAMADOL HCL) 1-2 by mouth every 6 hours as needed pain  Current Allergies (reviewed today): No known allergies   Past Medical History:    Reviewed history from 09/15/2007 and no changes required:       G2 P2       Hyperlipidemia       ?neck mass--CT 4-09 neg  Past Surgical History:    Reviewed history from 09/15/2007 and no changes required:       Hysterectomy       Lumbar laminectomy--2006 and revised // fusion2008 Dr Wynetta Emery       Tubal ligation       Tonsillectomy       breast reduction       Cholecystectomy--2004   Social History:    Reviewed history from 09/15/2007 and no changes required:       Married       2 kids       from [place]   Risk Factors: Tobacco use:  never Passive smoke exposure:  no Drug use:  no Alcohol use:  no Exercise:  yes    Times per week:  2   Review of Systems  General      Denies fever and weight loss.      + wt gain diet-- has not change or improved exercise-- "sometimes"  Resp      Denies shortness of breath.  GI      Denies bloody stools, diarrhea, nausea, and vomiting.   Physical  Exam  General:     alert, well-developed, and well-nourished.   Lungs:     normal respiratory effort, no intercostal retractions, no accessory muscle use, and normal breath sounds.   Heart:     normal rate, regular rhythm, and no murmur.   Abdomen:     soft, normal bowel sounds, no distention, no masses, no guarding, no hepatomegaly, and no splenomegaly.  slte tender to palpation    Impression & Recommendations:  Problem # 1:  HYPERLIPIDEMIA (ICD-272.4) c/o muscle aches, check CKs hold simvastatin x 4 weeks--let me know how she feels Her updated medication list for this problem includes:    Simvastatin 80 Mg Tabs (Simvastatin) .Marland Kitchen... 1 by mouth qhs  Orders: TLB-BMP (Basic Metabolic Panel-BMET) (80048-METABOL) TLB-Lipid Panel (80061-LIPID) Venipuncture (45409) TLB-CK Total Only(Creatine Kinase/CPK) (82550-CK)   Problem # 2:  ABDOMINAL PAIN (ICD-789.00) previous w/u :  u/s 11-7, 5-08--fatty liver, kidney stones B sx  atypical labs GI referal? Orders: TLB-CBC Platelet - w/Differential (85025-CBCD) TLB-Hepatic/Liver Function Pnl (80076-HEPATIC) TLB-Amylase (82150-AMYL) TLB-Lipase (83690-LIPASE)   Problem # 3:  NECK PAIN (ICD-723.1) per Dr Wynetta Emery Her updated medication list for this problem includes:    Tramadol Hcl 50 Mg Tabs (Tramadol hcl) .Marland Kitchen... 1-2 by mouth every 6 hours as needed pain   Complete Medication List: 1)  Simvastatin 80 Mg Tabs (Simvastatin) .Marland Kitchen.. 1 by mouth qhs 2)  Vitamin D 50,000 Iu  .Marland Kitchen.. 1 by mouth once daily x 12 weeks 3)  Caltrate 600 1500 Mg Tabs (Calcium carbonate) 4)  Tramadol Hcl 50 Mg Tabs (Tramadol hcl) .Marland Kitchen.. 1-2 by mouth every 6 hours as needed pain   Patient Instructions: 1)  stop simvastatin x 4 weeks 2)  when you came back let me know if that helped your muscle aches 3)  Please schedule a follow-up appointment in 3 months.   ]

## 2010-07-16 NOTE — Procedures (Signed)
Summary: Colonoscopy   Colonoscopy  Procedure date:  06/19/2008  Findings:      Location:  [facility].    Procedures Next Due Date:    Colonoscopy: 06/2013  COLONOSCOPY PROCEDURE REPORT  PATIENT:  [name], [name]  MR#:  [id number] BIRTHDATE:   02-11-1966   GENDER:   female  ENDOSCOPIST:   Barbette Hair. Arlyce Dice, MD Referred by: Helane Rima Beverely Low, M.D.  PROCEDURE DATE:  06/19/2008 PROCEDURE:  Higher-risk screening colonoscopy  colonoscopy via colostomy ASA CLASS:   Class I INDICATIONS: family history of colon cancer Sister  MEDICATIONS:    Fentanyl 75 mcg IV, Versed 6 mg IV  DESCRIPTION OF PROCEDURE:   After the risks benefits and alternatives of the procedure were thoroughly explained, informed consent was obtained.  Digital rectal exam was performed and revealed no abnormalities.   The LB [id number] endoscope was introduced through the anus and advanced to the cecum, which was identified by both the appendix and ileocecal valve, without limitations.  The quality of the prep was excellent, using MoviPrep.  The instrument was then slowly withdrawn as the colon was fully examined. <<PROCEDUREIMAGES>>                <<OLD IMAGES>>  FINDINGS:  A normal appearing cecum, ileocecal valve, and appendiceal orifice were identified. The ascending, transverse, descending, sigmoid colon, and rectum appeared unremarkable (see image1, image2, image3, image4, image5, image6, image14, image15, and image16).   Retroflexed views in the rectum revealed no abnormalities.    The scope was then withdrawn from the patient and the procedure completed.  COMPLICATIONS:   None  ENDOSCOPIC IMPRESSION:  1) Normal colon RECOMMENDATIONS:  1) Given the significant family history, the patient should have a repeat colonoscopy in 5 years  REPEAT EXAM:   In 5 year(s) for Colonoscopy.   _______________________________ Barbette Hair. Arlyce Dice, MD  CC: Helane Rima. Beverely Low, MD

## 2010-07-16 NOTE — Procedures (Signed)
Summary: EGD   EGD  Procedure date:  10/24/2001  Findings:      Findings: Normal  Location: [facility]   Patient Name: [name], [name] MRN:  Procedure Procedures: Panendoscopy (EGD) CPT: 43235.  Personnel: Endoscopist: Barbette Hair. Arlyce Dice, MD.  Indications Symptoms: Abdominal pain,  History  Pre-Exam Physical: Performed Oct 26, 2001  Entire physical exam was normal.  Exam Exam Info: Maximum depth of insertion Duodenum, intended Duodenum. Vocal cords visualized. Gastric retroflexion performed. ASA Classification: I. Tolerance: good.  Sedation Meds: Robinul 0.2 given IV. Fentanyl 50 mcg. given IV. Versed 5 mg. given IV. Cetacaine Spray 1 sprays given aerosolized.  Monitoring: BP and pulse monitoring done. Oximetry used. Supplemental O2 given at 2 Liters.  Findings - Normal: Proximal Esophagus to Duodenal 2nd Portion.   Assessment Normal examination.  Events  Unplanned Intervention: No unplanned interventions were required.  Unplanned Events: There were no complications. Plans Medication(s): H2Blocker: Cimetidine/Tagamet 400 mg BID, starting Oct 26, 2001   Scheduling: Office Visit, to [employer]. Arlyce Dice, MD, around Nov 16, 2001.  Blood Tests, CBC, amylase, CMET on Oct 26, 2001.  Ultrasound, to Abdomen and Pelvis, around Oct 29, 2001.    This report was created from the original endoscopy report, which was reviewed and signed by the above listed endoscopist.

## 2010-07-16 NOTE — Letter (Signed)
Summary: [facility]   Imported By: Lanelle Bal 08/23/2008 10:04:56  _____________________________________________________________________  External Attachment:    Type:   Image     Comment:   External Document

## 2010-07-16 NOTE — Progress Notes (Signed)
Summary: info se radiology-dr lowne  Phone Note Call from Patient Call back at Home Phone [phone]   Caller: Patient Summary of Call: patient calling back with info [facility] [phone] - [phone] [address] -  ultra sound stomache  Initial call taken by: Okey Regal Spring,  September 19, 2007 3:46 PM  Follow-up for Phone Call        PCP:  Was only seen at [facility] and Dr. Lily Peer gynecologist.  [facility] phone # [phone] and fax # [phone] ...................................................................Ardyth Man  September 19, 2007 3:52 PM  Follow-up by: Ardyth Man,  September 19, 2007 3:52 PM  Additional Follow-up for Phone Call Additional follow up Details #1::        Dr Drue Novel I got labs from [facility] and radiology reports - did you want me to request notes from gyn? ..................................................................Marland KitchenShary Decamp  September 19, 2007 4:38 PM when she came back....................................................................Mikhael Hendriks E. Uniqua Kihn MD  September 25, 2007 1:01 PM

## 2010-07-16 NOTE — Assessment & Plan Note (Signed)
Summary: sore throat/kdc   Vital Signs:  Patient profile:   45 year old female Height:      60 inches Weight:      164.6 pounds Temp:     99.5 degrees F oral BP sitting:   102 / 60  (left arm) Cuff size:   regular  Vitals Entered By: Shary Decamp (December 04, 2008 2:07 PM) CC: ACUTE Comments  - ST x 3 days  - facial pain, pressure  - HA Shary Decamp  December 04, 2008 2:08 PM    History of Present Illness: see above  Current Medications (verified): 1)  Caltrate 600 1500 Mg  Tabs (Calcium Carbonate) 2)  Opana 10 Mg Tabs (Oxymorphone Hcl) .Marland Kitchen.. 1-2 Q6h Per Dr Abigail Miyamoto 3)  Vitamin D 2000u .... Once Daily 4)  Metoclopramide Hcl 10 Mg Tabs (Metoclopramide Hcl) .Marland Kitchen.. 1 By Mouth Q Ac  Allergies (verified): No Known Drug Allergies  Past History:  Past Medical History: Reviewed history from 10/22/2008 and no changes required. G2 P2 Hyperlipidemia ?neck mass--CT 4-09 neg Palpitations-- saw cards 11-09, they ordered a ECHO and a Holter (done?) Cscope 1-10 Normal, repeat 2015 given FH Fatty liver-mild per CT 12-09 Allergic rhinitis Chronic LBP (Dr Barco--Dr Wynetta Emery)  Past Surgical History: Reviewed history from 02/23/2008 and no changes required. Hysterectomy, no oophorectomy Lumbar laminectomy--2006 and revised // fusion2008 Dr Wynetta Emery Tubal ligation Tonsillectomy breast reduction Cholecystectomy--2004  Social History: Reviewed history from 05/03/2008 and no changes required. Married 2 kids from [place] No tobacco  No etoh No illicit drugs 1-2 caffeinated beverages per day  Review of Systems General:  no fever, on ibuprofen . Resp:  Denies cough; no chest congestion. GI:  Denies nausea and vomiting. MS:  Denies muscle aches.  Physical Exam  General:  alert and well-developed.   Head:  slightly  tender to palpation @ maxilary area  face symetric  Eyes:  EOMI Nose:  congested  Mouth:  no red  Lungs:  normal respiratory effort, no intercostal retractions, no  accessory muscle use, and normal breath sounds.   Heart:  normal rate, regular rhythm, and no murmur.     Impression & Recommendations:  Problem # 1:  SINUSITIS - ACUTE-NOS (ICD-461.9)  Her updated medication list for this problem includes:    Amoxicillin 500 Mg Tabs (Amoxicillin) .Marland Kitchen... 2 by mouth two times a day  Complete Medication List: 1)  Caltrate 600 1500 Mg Tabs (Calcium carbonate) 2)  Opana 10 Mg Tabs (Oxymorphone hcl) .Marland Kitchen.. 1-2 q6h per dr Abigail Miyamoto 3)  Vitamin D 2000u  .... Once daily 4)  Metoclopramide Hcl 10 Mg Tabs (Metoclopramide hcl) .Marland Kitchen.. 1 by mouth q ac 5)  Amoxicillin 500 Mg Tabs (Amoxicillin) .... 2 by mouth two times a day 6)  Prednisone 20 Mg Tabs (Prednisone) .Marland Kitchen.. 1 by mouth once daily  Patient Instructions: 1)  Get plenty of rest, drink lots of clear liquids, and use Tylenol or Ibuprofen 2)  mucinex DM - sudafed 3)  amoxicillin, antibiotic 4)  prednisone x 5 days  5)  Return in 7-10 days if you're not better: sooner if you'er feeling worse.  Prescriptions: PREDNISONE 20 MG TABS (PREDNISONE) 1 by mouth once daily  #5 x 0   Entered and Authorized by:   Nolon Rod. Paz MD   Signed by:   Nolon Rod. Paz MD on 12/04/2008   Method used:   Electronically to        [id number] (retail)  [address]       [place], [place]  [address]       Ph: [phone]       Fax: [phone]   RxID:   [id number] AMOXICILLIN 500 MG TABS (AMOXICILLIN) 2 by mouth two times a day  #40 x 0   Entered and Authorized by:   Nolon Rod. Paz MD   Signed by:   Nolon Rod. Paz MD on 12/04/2008   Method used:   Electronically to        [id number]* (retail)       [address] [place], [place]  [address]       Ph: [phone]       Fax: [phone]   RxID:   [id number]

## 2010-07-16 NOTE — Letter (Signed)
Summary: Primary Care Consult Scheduled Letter  [facility] [place]  [address] [place], [place] [address]   Phone: [phone]  Fax: [phone]      09/19/2007 MRN: [id number]  Esmeralda Congo [address] [place], [place]  [address]    Dear Ms. Congo,      We have scheduled an appointment for you.  At the recommendation of Dr.PAZ, we have scheduled you a consult with [facility] [address] on 04.13.09 @ 11:30 CK IN 11:15.  Their phone number is [phone].  If this appointment day and time is not convenient for you, please feel free to call the office of the doctor you are being referred to at the number listed above and reschedule the appointment.     It is important for you to keep your scheduled appointments. We are here to make sure you are given good patient care. If yu have questions or you have made changes to your appointment, please notify us at  [phone], ask for [facility].    Thank you,  Patient Care Coordinator [place] [facility]

## 2010-07-16 NOTE — Assessment & Plan Note (Signed)
Summary: pain left side/alr   Vital Signs:  Patient profile:   45 year old female Height:      60 inches Weight:      164.4 pounds Temp:     98.9 degrees F oral Pulse rate:   78 / minute Pulse rhythm:   regular BP sitting:   110 / 60  (left arm) Cuff size:   regular  Vitals Entered By: Shary Decamp (Nov 06, 2008 11:08 AM) CC: ACUTE ONLY Comments  - c/o of low abd pain - radiates to her back x 1 year  - "swelling" in abd  - no N/V  - nl BM Stacia Hawks  Nov 06, 2008 11:22 AM    History of Present Illness: ACUTE ONLY  - c/o  upper  abd  bloating-dyscomfort w/ some rad.  to the L mid back x 1 year; sometimes  feels like the area is swollen   - no N/V  - nl BM, 1-2 BMs a day  trial w/ omeprazole did not help symptoms worse x few months; she has pain and sees Dr Abigail Miyamoto , on chronic narcotics, on Opana for few months   Current Medications (verified): 1)  Caltrate 600 1500 Mg  Tabs (Calcium Carbonate) 2)  Opana 10 Mg Tabs (Oxymorphone Hcl) .Marland Kitchen.. 1-2 Q6h Per Dr Abigail Miyamoto 3)  Vitamin D 2000u .... Once Daily  Allergies (verified): No Known Drug Allergies  Past History:  Past Medical History: Reviewed history from 10/22/2008 and no changes required. G2 P2 Hyperlipidemia ?neck mass--CT 4-09 neg Palpitations-- saw cards 11-09, they ordered a ECHO and a Holter (done?) Cscope 1-10 Normal, repeat 2015 given FH Fatty liver-mild per CT 12-09 Allergic rhinitis Chronic LBP (Dr Barco--Dr Wynetta Emery)  Past Surgical History: Reviewed history from 02/23/2008 and no changes required. Hysterectomy, no oophorectomy Lumbar laminectomy--2006 and revised // fusion2008 Dr Wynetta Emery Tubal ligation Tonsillectomy breast reduction Cholecystectomy--2004  Review of Systems       no fever no wt loss (actually gaining wt)  GU:  Denies dysuria and hematuria.  Physical Exam  General:  alert and overweight-appearing.   Neck:  no masses and no thyromegaly.   Lungs:  normal respiratory effort, no  intercostal retractions, no accessory muscle use, and normal breath sounds.   Heart:  normal rate, regular rhythm, and no murmur.   Abdomen:  soft, normal bowel sounds, no distention, no masses, no guarding, and no rigidity.  minimal generalize abd dyscomfort w/ palpation    Impression & Recommendations:  Problem # 1:  DYSPEPSIA, CHRONIC (ICD-536.8) chart reviewed  12-09: CT abd-pelvis neg except for fatty liver 06-2008 normal Cscope no anemia or thyroid dz s/p cholecystectomy symptoms may be related to chronic narcotics (gastric dysmotility)  Vs others  plan: labs, trial w/  reglan,to GI  (EGD Vs gastric empty study), consider call Dr Abigail Miyamoto ?change meds  also to call if decelops  reglan s/e   Orders: Venipuncture ([address]) TLB-Hepatic/Liver Function Pnl (80076-HEPATIC) TLB-CBC Platelet - w/Differential (85025-CBCD) TLB-BMP (Basic Metabolic Panel-BMET) (80048-METABOL) Gastroenterology Referral (GI)  Problem # 2:  multiple questions answer   Complete Medication List: 1)  Caltrate 600 1500 Mg Tabs (Calcium carbonate) 2)  Opana 10 Mg Tabs (Oxymorphone hcl) .Marland Kitchen.. 1-2 q6h per dr Abigail Miyamoto 3)  Vitamin D 2000u  .... Once daily 4)  Metoclopramide Hcl 10 Mg Tabs (Metoclopramide hcl) .Marland Kitchen.. 1 by mouth q ac  Other Orders: UA Dipstick w/o Micro (manual) (60454)  Patient Instructions: 1)  Please schedule a follow-up appointment in  1 month.  Prescriptions: METOCLOPRAMIDE HCL 10 MG TABS (METOCLOPRAMIDE HCL) 1 by mouth q ac  #90 x 0   Entered and Authorized by:   Nolon Rod. Paz MD   Signed by:   Nolon Rod. Paz MD on 11/06/2008   Method used:   Print then Give to Patient   RxID:   [id number]   Laboratory Results   Urine Tests    Routine Urinalysis   Glucose: negative   (Normal Range: Negative) Bilirubin: negative   (Normal Range: Negative) Ketone: negative   (Normal Range: Negative) Spec. Gravity: 1.020   (Normal Range: 1.003-1.035) Blood: negative   (Normal Range: Negative) pH: 5.0    (Normal Range: 5.0-8.0) Protein: negative   (Normal Range: Negative) Urobilinogen: 0.2   (Normal Range: 0-1) Nitrite: negative   (Normal Range: Negative) Leukocyte Esterace: negative   (Normal Range: Negative)

## 2010-07-16 NOTE — Letter (Signed)
Summary: Results Follow up Letter  [name] [place]  [address] [place], [place] [address]   Phone: [phone]  Fax: [phone]    03/04/2008 MRN: [id number]  [name] [address] [place], [place]  [address]  Dear [name],  The following are the results of your recent test(s):  Test         Result    Pap Smear:        Normal _____  Not Normal _____ Comments: ______________________________________________________ Cholesterol: LDL(Bad cholesterol):         Your goal is less than:         HDL (Good cholesterol):       Your goal is more than: Comments:  ______________________________________________________ Mammogram:        Normal _____  Not Normal _____ Comments:  ___________________________________________________________________ Hemoccult:        Normal _____  Not normal _______ Comments:    _____________________________________________________________________ Other Tests:   Attached is a copy of your lab work from 02/29/08.  Your cholesterol is a little elevated.  Please follow a low fat/low cholesterol diet.  I have enclosed some diet information for you.  Please call me if you have any questions. Alena Bills

## 2010-07-16 NOTE — Assessment & Plan Note (Signed)
Summary: chronic dyspepsia.Marland Kitchenem   History of Present Illness Visit Type: consult Primary GI MD: Melvia Heaps MD [facility] Primary Provider: Willow Ora, MD Requesting Provider: Willow Ora, MD Chief Complaint: Pt states that she has lower abd pain, with bloating, and constipation x one year but its getting worse. [name] is a pleasant 45 year old Hispanic female referred at the request of Dr. Drue Novel for abdominal discomfort.  Over the past year she has been complaining of postprandial bloating and secondary discomfort.  She has occasional pyrosis.  There is been no change in bowel habits melena or hematochezia.  She denies excess irritations or flatus.  Weight has actually increased over the past 6 months.  She feels her symptoms have worsened.  She takes narcotics chronically for her low back pain though she claims that she moves her bowels regularly.   GI Review of Systems    Reports abdominal pain and  bloating.     Location of  Abdominal pain: lower abdomen.    Denies acid reflux, belching, chest pain, dysphagia with liquids, dysphagia with solids, heartburn, loss of appetite, nausea, vomiting, vomiting blood, weight loss, and  weight gain.      Reports constipation.     Denies anal fissure, black tarry stools, change in bowel habit, diarrhea, diverticulosis, fecal incontinence, heme positive stool, hemorrhoids, irritable bowel syndrome, jaundice, light color stool, liver problems, rectal bleeding, and  rectal pain.    Current Medications (verified): 1)  Caltrate 600 1500 Mg  Tabs (Calcium Carbonate) 2)  Amoxicillin 500 Mg Tabs (Amoxicillin) .... 2 By Mouth Two Times A Day 3)  Prednisone 20 Mg Tabs (Prednisone) .Marland Kitchen.. 1 By Mouth Once Daily 4)  Vitamin D (Ergocalciferol) 50000 Unit Caps (Ergocalciferol) .... One Tablet By Mouth Every Week 5)  Opana Er 20 Mg Xr12h-Tab (Oxymorphone Hcl) .... One Tablet By Mouth Once A Week  Allergies (verified): No Known Drug Allergies  Past History:  Past  Medical History: G2 P2 Hyperlipidemia ?neck mass--CT 4-09 neg Palpitations-- saw cards 11-09, they ordered a ECHO and a Holter (done?) Cscope 1-10 Normal, repeat 2015 given FH Fatty liver-mild per CT 12-09 Allergic rhinitis Chronic LBP (Dr Barco--Dr Wynetta Emery) Kidney Stones  Past Surgical History: Reviewed history from 02/23/2008 and no changes required. Hysterectomy, no oophorectomy Lumbar laminectomy--2006 and revised // fusion2008 Dr Wynetta Emery Tubal ligation Tonsillectomy breast reduction Cholecystectomy--2004  Family History: Reviewed history from 05/23/2008 and no changes required. CAD - no HTN - no DM - no Colon Ca - sister (age 69) breast Ca - no stroke - F Mother-alive and well Father-died at 45yo, unsure of cause Healthy brother and sister no premature CAD or SCD in family  Social History: Reviewed history from 05/03/2008 and no changes required. Married 2 kids from [place] No tobacco  No etoh No illicit drugs 1-2 caffeinated beverages per day  Review of Systems       The patient complains of back pain.  The patient denies allergy/sinus, anemia, anxiety-new, arthritis/joint pain, blood in urine, breast changes/lumps, change in vision, confusion, cough, coughing up blood, depression-new, fainting, fatigue, fever, headaches-new, hearing problems, heart murmur, heart rhythm changes, itching, menstrual pain, muscle pains/cramps, night sweats, nosebleeds, pregnancy symptoms, shortness of breath, skin rash, sleeping problems, sore throat, swelling of feet/legs, swollen lymph glands, thirst - excessive , urination - excessive , urination changes/pain, urine leakage, vision changes, and voice change.         All other systems were reviewed and were negative   Vital Signs:  Patient profile:  45 year old female Height:      60 inches Weight:      161.2 pounds BMI:     31.60 BSA:     1.70 Pulse rate:   80 / minute Pulse rhythm:   regular BP sitting:   106 / 68  (left  arm) Cuff size:   regular  Vitals Entered By: Ok Anis CMA (December 10, 2008 3:48 PM)  Physical Exam  Additional Exam:  She is a little well-nourished female  skin: anicteric HEENT: normocephalic; PEERLA; no nasal or pharyngeal abnormalities neck: supple nodes: no cervical lymphadenopathy chest: clear to ausculatation and percussion heart: no murmurs, gallops, or rubs abd: soft, nontender; BS normoactive; no abdominal masses, tenderness, organomegaly rectal: deferred ext: no cynanosis, clubbing, edema skeletal: no deformities neuro: oriented x 3; no focal abnormalities    Impression & Recommendations:  Problem # 1:  DYSPEPSIA, CHRONIC (ICD-536.8) Assessment Deteriorated Symptoms are very nonspecific and are most likely due to nonulcer dyspepsia.  A TSH  in September, 2009 was normal..  She may have a component of GERD.  Her chronic narcotic use also may be contributing to hypomotility.  Recommendations #1 trial of omeprazole 20 mg a day #2 trial of Amitiza 8 micrograms b.i.d. #3 consider upper endoscopy if patient is not improved on the above medicines Prescriptions: OMEPRAZOLE 20 MG  CPDR (OMEPRAZOLE) 1 each day 30 minutes before meal  #30 x 2   Entered and Authorized by:   Louis Meckel MD   Signed by:   Louis Meckel MD on 12/10/2008   Method used:   Electronically to        [id number]* (retail)       [address] [place], [place]  [address]       Ph: [phone]       Fax: [phone]   RxID:   [id number] AMITIZA 8 MCG CAPS (LUBIPROSTONE) take one pack twice a day as needed for bloating  #20 x 2   Entered and Authorized by:   Louis Meckel MD   Signed by:   Louis Meckel MD on 12/10/2008   Method used:   Electronically to        [id number]* (retail)       [address] [place], [place]  [address]       Ph: [phone]       Fax: [phone]   RxID:   [id number] AMITIZA 8 MCG CAPS  (LUBIPROSTONE) take one tab twice a day as needed for bloating  #20 x 0   Entered and Authorized by:   Louis Meckel MD   Signed by:   Louis Meckel MD on 12/10/2008   Method used:   Electronically to        [id number]* (retail)       [address] [place], [place]  [address]       Ph: [phone]       Fax: [phone]   RxID:   [id number]

## 2010-07-16 NOTE — Letter (Signed)
Summary: Results Follow up Letter  [facility]  [address] [place], [place] [address]   Phone: [phone]  Fax: [phone]    06/04/2008 MRN: [id number]  Jourdyn Congo [address] [place], [place]  [address]  Dear Ms. Congo,  The following are the results of your recent test(s):  Test         Result    Pap Smear:        Normal _____  Not Normal _____ Comments: ______________________________________________________ Cholesterol: LDL(Bad cholesterol):         Your goal is less than:         HDL (Good cholesterol):       Your goal is more than: Comments:  ______________________________________________________ Mammogram:        Normal _____  Not Normal _____ Comments:  ___________________________________________________________________ Hemoccult:        Normal _____  Not normal _______ Comments:    _____________________________________________________________________ Other Tests: PLEASE SEE COPY OF LABS FROM 05/23/08- LABS AND CT SCAN NORMAL!    We routinely do not discuss normal results over the telephone.  If you desire a copy of the results, or you have any questions about this information we can discuss them at your next office visit.   Sincerely,

## 2010-08-30 LAB — BASIC METABOLIC PANEL
BUN: 4 mg/dL — ABNORMAL LOW (ref 6–23)
CO2: 22 mEq/L (ref 19–32)
Calcium: 8.6 mg/dL (ref 8.4–10.5)
Chloride: 100 mEq/L (ref 96–112)
Creatinine, Ser: 0.47 mg/dL (ref 0.4–1.2)
GFR calc Af Amer: >60 mL/min (ref >60)
GFR calc non Af Amer: >60 mL/min (ref >60)
Glucose, Bld: 103 mg/dL — ABNORMAL HIGH (ref 70–99)
Potassium: 3.4 mEq/L — ABNORMAL LOW (ref 3.5–5.1)
Sodium: 132 mEq/L — ABNORMAL LOW (ref 135–145)

## 2010-08-30 LAB — URINALYSIS, ROUTINE W REFLEX MICROSCOPIC
Bilirubin Urine: NEGATIVE
Glucose, UA: NEGATIVE mg/dL
Hgb urine dipstick: NEGATIVE
Ketones, ur: NEGATIVE mg/dL
Nitrite: NEGATIVE
Protein, ur: NEGATIVE mg/dL
Specific Gravity, Urine: 1.013 (ref 1.005–1.030)
Urobilinogen, UA: 0.2 mg/dL (ref 0.0–1.0)
pH: 6 (ref 5.0–8.0)

## 2010-08-30 LAB — TSH: TSH: 4.649 u[IU]/mL — ABNORMAL HIGH (ref 0.350–4.500)

## 2010-08-30 LAB — DIFFERENTIAL
Basophils Absolute: 0 10*3/uL (ref 0.0–0.1)
Basophils Relative: 0 % (ref 0–1)
Eosinophils Absolute: 0.2 10*3/uL (ref 0.0–0.7)
Eosinophils Relative: 1 % (ref 0–5)
Lymphocytes Relative: 46 % (ref 12–46)
Lymphs Abs: 5.7 10*3/uL — ABNORMAL HIGH (ref 0.7–4.0)
Monocytes Absolute: 1 10*3/uL (ref 0.1–1.0)
Monocytes Relative: 8 % (ref 3–12)
Neutro Abs: 5.5 10*3/uL (ref 1.7–7.7)
Neutrophils Relative %: 44 % (ref 43–77)

## 2010-08-30 LAB — GLUCOSE, CAPILLARY: Glucose-Capillary: 96 mg/dL (ref 70–99)

## 2010-08-30 LAB — CBC
HCT: 38.1 % (ref 36.0–46.0)
Hemoglobin: 13.3 g/dL (ref 12.0–15.0)
MCHC: 34.8 g/dL (ref 30.0–36.0)
MCV: 91.1 fL (ref 78.0–100.0)
Platelets: 338 10*3/uL (ref 150–400)
RBC: 4.18 MIL/uL (ref 3.87–5.11)
RDW: 12.4 % (ref 11.5–15.5)
WBC: 12.4 10*3/uL — ABNORMAL HIGH (ref 4.0–10.5)

## 2010-08-30 LAB — MAGNESIUM: Magnesium: 2 mg/dL (ref 1.5–2.5)

## 2010-09-20 LAB — CBC
HCT: 39.8 % (ref 36.0–46.0)
Hemoglobin: 13.9 g/dL (ref 12.0–15.0)
MCHC: 35 g/dL (ref 30.0–36.0)
MCV: 90.3 fL (ref 78.0–100.0)
Platelets: 360 10*3/uL (ref 150–400)
RBC: 4.41 MIL/uL (ref 3.87–5.11)
RDW: 12.3 % (ref 11.5–15.5)
WBC: 9.1 10*3/uL (ref 4.0–10.5)

## 2010-10-27 NOTE — Op Note (Signed)
NAME:  [name], [name]        ACCOUNT NO.:  [id number]   MEDICAL RECORD NO.:  [id number]          PATIENT TYPE:  INP   LOCATION:  2899                         FACILITY:  [facility]   PHYSICIAN:  Donalee Citrin, M.D.        DATE OF BIRTH:  Oct 26, 1965   DATE OF PROCEDURE:  01/13/2009  DATE OF DISCHARGE:                               OPERATIVE REPORT   PREOPERATIVE DIAGNOSIS:  Painful hardware.   PROCEDURE:  Exploration of her fusion, removal of hardware, L4-S1.   SURGEON:  Donalee Citrin, MD   ASSISTANT:  Tia Alert, MD   ANESTHESIA:  Endotracheal.   HISTORY OF PRESENT ILLNESS:  The patient is a very pleasant 45 year old  female who underwent a L4-S1 posterior lumbar body fusion approximately  2 years ago.  The patient had radiographic evidence of  solid bony  fusion.  However, the patient had persistent back pain with pain and  point tenderness overlying the screw heads down around L5 and S1.  This  was refractory to all forms of treatment and both MRI and CT scan showed  no new structure pathology and solid bony fusion from L4-S1, so the  patient requested removal of hardware after it was felt that the screws  were the source of most of her discomfort.  So, risks and benefits of  the operation were explained to the patient and she understood and  agreed to proceed forward.   DESCRIPTION OF PROCEDURE:  The patient was brought to the OR, induced  under general anesthesia, and positioned prone on the Wilson frame.  Back was prepped and draped in the usual sterile fashion.  Her old  incision was ellipsed in size and opened up.  The scar tissue dissected  free exposing the hardware from L4 down to S1.  The fusion was inspected  and noted to be solid.  Then, the top 2 nuts were removed.  The cross-  link was dissected free, removed the rods, removed the screws, removed  all the pedicles, screw holes were waxed, and meticulous hemostasis was  maintained.  A small Hemovac drain was placed.   The wound was copiously  irrigated and closed in layers with interrupted Vicryl and the skin was  closed with running 4-0 subcuticular.  Benzoin and Steri-Strips were  applied.  The patient went to the recovery room in stable condition.  At  the end of the case, sponge and instrument count were correct.           ______________________________  Donalee Citrin, M.D.     GC/MEDQ  D:  01/13/2009  T:  01/13/2009  Job:  [id number]

## 2010-10-27 NOTE — Op Note (Signed)
NAME:  [name], [name]        ACCOUNT NO.:  [id number]   MEDICAL RECORD NO.:  [id number]          PATIENT TYPE:  INP   LOCATION:  2899                         FACILITY:  [facility]   PHYSICIAN:  Donalee Citrin, M.D.        DATE OF BIRTH:  01/20/1966   DATE OF PROCEDURE:  DATE OF DISCHARGE:                               OPERATIVE REPORT   PREOPERATIVE DIAGNOSIS:  Left L5-S1 radiculopathy from severe  degenerative disk disease and spinal stenosis at L4-5 and L5-S1.   PROCEDURE PERFORMED:  1. Re-do decompressive laminectomy at L5-S1.  2. Decompressive laminectomy at L4-5.  3. Posterior lumbar interbody fusion L4-5 and L5-S1 using a Hybrid      Telemon peak cage and tangent allograft wedge 10 x 22 and 10 x 26      mm  respectively in the interspaces.  4. Pedicle screw fixation L4 to S1 using the 6.35 Legacy pedicle screw      system, posterolateral arthrodesis,  L4 to S1 using locally      harvested allograft mixed with Progenics bone substitute placed.   SURGEON:  Dr. Wynetta Emery.   ASSISTANT SURGEON:  Kathaleen Maser. Pool, M.D..   ANESTHESIA:  General endotracheal anesthesia.   HISTORY OF PRESENT ILLNESS:  The patient is a very pleasant 45 year old  female who is a few years out from a laminectomy microdiskectomy with  persistent recurrent left leg radiculopathy and severe mechanical and  degenerative disk disease back pain.  Imaging with MRI scan with and  without contrast and discography showed degenerative disk disease at the  operative level with recurrent spinal stenosis and lateral recess  stenosis at L5-S1, as well as annular tearing and degenerative disk  disease at L4-5.  The patient underwent discography that showed strongly  concordant responses at L4-5 and also less so but also at L5-S1.  Due to  the patient's previous surgery at L5-S1, her response to discography and  her failure with conservative treatment, the patient is recommended 2-  level re-do laminectomy and diskectomy with  interbody fusions.  The  risks and benefits of the operations were explained to the patient who  understands and agrees to proceed.   OPERATION IN DETAIL:  The patient was brought to the OR suite under  general anesthesia.  The patient was placed prone on the Wilson frame.  Her back was prepped and draped in the usual sterile fashion.  Her old  incision was opened up and extended cephalad.  Subperiosteal dissection  was carried out in the lamina of L4-5 and S1 where the scar tissue was  dissected free with a laminotomy at L5-S1 at the left side.  The 4 and 5  lamina complexes were noted to be more mobile than what would be  expected in a normal situation.  The joints were noted to be somewhat  diastased and hypermobile.  The remainder of the spinous at L5 was  removed and the complete spinous process at L4 was removed.  Complete  decompressive laminectomies and medial facetectomies were performed at  L4-5, decompressing the 4 and the 5 root out both sides.  Then, a  completed medial facetectomy was performed on the right side of L5-S1  and the scar tissue was dissected free using a combination of the 4  Penfield dental dissectors on the left side of L5-S1 and complete medial  facetectomies were performed with the residual lamina of the facet  complex at L5-S1 left.  There was noted to be an extensive amount of  scar tissue tethering the L5-S1 disk space and the S1 nerve root against  the S1 pedicle.  The L5 nerve root on the left side was also noted to be  markedly swollen and this was freed up from the underlying scar tissue  and disk space as well.   After complete decompression had begun, attention was taken to the  interbody work.  Using a D' Errico, the left L4 nerve root was reflected  medially.  The annular was __________  lumbar scalpel and the disk space  was cleaned out.  Then, a size 10 distractor was inserted.  This had  good apposition of the endplates.  It was felt to be  an appropriate size  for the graft, so a 10 x 22 mm Telemon peak cage and a 10 x 26 mm  tangent allograft wedge were all opened and prepped.  The cage was  packed with locally harvested allograft mixed with Progenics bone  substitute.  Then the right-sided interspace was cut out and prepared.  Again, fluoroscopy confirmed each step along the way.  A size 10 cutter  and chisel were used to prepare the endplate and a peak cage was  inserted on the patient's right side.  Then, the distractor was removed.  Fluoroscopy confirmed good position.  The endplates on the left were  prepared in a similar fashion.  Locally harvested allograft Progenics  was packed centrally and the left side tangent allograft wedge was  inserted.   After all the interbody work had been done on L4-5, attention was taken  to the L5-S1.  The right side distractor was inserted.  Again, the 10-mm  distractor had good apposition of the endplates.  Then the scar tissue  was additionally freed up and the left side interspace was cleaned out.  A 10 x 22 mm peak cage with locally harvested allograft mixed Progenics  was packed on the left side.  Then on the right side in a similar  fashion, the interspace was cleaned out.  During placement of the  tangent allograft, some resistance was encountered and the allograft was  displaced medially at an angle almost more of a T-lift situation.  It  was in good position from an anterior posterior perspective, but it did  displaced the peak graft slightly posteriorly.  Attempts were made to  try and get the peak further advanced in unsuccessfully.  Due to this  and the hypermobility of the peak, dorsally it was elected to remove the  peak and replace it with a tangent.  So a 10 x 26 mm tangent was then  inserted on the patient's left side.  It was still a little bit proud,  so it was drilled down until it was behind the posterior cortical surface.  The L5 and S1 neural foramen were  explored to confirm no  compression from the graft material.  This was confirmed.  Then  __________  were placed.  A pilot hole was drilled at L4 on the left  with the awl probed with a 4.5 tap.  The probe began and a 5.5 x  40  screw was inserted at L4 on the left.  Fluoroscopy at each step along  the way, as well as probing from within the pedicle and within the canal  confirmed no mediolateral breach.  The L5 and S1 screws in similar  fashion with a 5.5 x 40 at L5 and a 6.5 x 35 at S1 on the right side all  the same size screws were inserted in similar fashion without event.  After all 6 screws had been placed, the wound was copiously irrigated  and fixed with hemostasis.  Gross decortication was carried out of the  TPs and lateral gutters.  The remainder of the autograft with Progenics  was packed in the lateral gutters and 60 mm rods were inserted,  tightened down at S1.  The L5 pedicle screw was compressed against S1  and the L4 against L5.  The neural foramina were then re-explored to  confirm the migration of graft material.  The interbody spaces were  again formed to be in the same good position.  Using hockey-stick and  coronary dilators, the foramina were confirmed to be patent.  Then,  Gelfoam was overlayed on top of the dura.  A 420 crosslink was inserted  between the L4 and L5 pedicle screws.  The medium Hemovac drain was  placed.  Then postoperative films showed good position of the screws,  rods and bone graft.  Then the wound was closed in layers with  interrupted Vicryl  with a running 4-0 subcuticular in the skin.  Benzoin and Steri-Strips were applied.  The patient went to the recovery  room in stable condition.   At the end of the case, the needle count and sponge count were correct.           ______________________________  Donalee Citrin, M.D.     GC/MEDQ  D:  11/14/2006  T:  11/14/2006  Job:  [id number]

## 2010-10-27 NOTE — Assessment & Plan Note (Signed)
[facility]                            CARDIOLOGY OFFICE NOTE   MARIS, ABASCAL                 MRN:          [id number]  DATE:05/03/2008                            DOB:          April 17, 1966    PRIMARY CARE PHYSICIAN:  Willow Ora, MD   REASON FOR CONSULTATION:  Palpitations.   HISTORY OF PRESENT ILLNESS:  Ms. Congo is a pleasant 45 year old  female with past medical history significant for hyperlipidemia who  presented to her primary care physician's office earlier this week with  complaints of palpitations.  She was referred today for further  evaluation of this complaint.  She tells me that she has been in her  normal state of good health with just chronic back pain as her only  complaint.  About 6 months ago, she began to notice episodes of  irregularity of her heartbeat.  She describes this as a feeling that her  heart is racing.  These episodes still also like she may be having skip  beats and last for 2-3 seconds.  There are no associated symptoms of  shortness of breath, diaphoresis, nausea, vomiting, or dizziness.  Symptoms do not occur every day, but do occur several times per week.  The episodes only last for a few seconds and resolve on their own.  Once  again, the patient has had no episodes of dizziness, near syncope, or  syncope.  Separately, she describes occasional left arm pain, which is  not associated with chest pain, shortness of breath, diaphoresis,  nausea, or palpitations.  She has no other complaints at this time.  She  tells me that she has had no prior history of heart disease.   PAST MEDICAL HISTORY:  Hyperlipidemia.   PAST SURGICAL HISTORY:  1. Bilateral tubal ligation.  2. Hysterectomy.  3. Lumbar laminectomy in 2006 and 2008.  4. Tonsillectomy.  5. Breast reduction surgery.  6. Cholecystectomy.   ALLERGIES:  No known drug allergies.   CURRENT MEDICATIONS:  1. Caltrate 600 mg once daily.  2.  Hydrocodone/acetaminophen 5/500 mg 2-3 tablets once daily.  3. Vitamin D 1000 units once daily.   SOCIAL HISTORY:  The patient denies use of tobacco, alcohol, or illicit  drugs.  She is married and has 2 children.  She is employed as an Designer, fashion/clothing.   FAMILY HISTORY:  The patient's mother is alive and healthy.  Her father  died at age of 75, but she is not sure what he died from.  She has one  brother and one sister who are both healthy.  There is no family history  of premature coronary artery disease or sudden cardiac death.   REVIEW OF SYSTEMS:  As stated in history of present illness and is  otherwise negative.   PHYSICAL EXAMINATION:  VITALS:  Blood pressure 100/76, pulse 81 and  regular, and respirations 12.  GENERAL:  She is a pleasant young female, in no acute distress.  She is  alert and oriented x3.  PSYCHIATRIC:  Mood and affect are normal.  MUSCULOSKELETAL:  Muscle strength and tone are normal.  NEUROLOGICAL:  No focal  neurological deficits.  SKIN:  Warm and dry.  HEENT:  Normal.  NECK:  No JVD.  No carotid bruits.  No thyromegaly.  No lymphadenopathy.  LUNGS:  Clear to auscultation bilaterally without wheezes, rhonchi, or  crackles noted.  CARDIOVASCULAR:  Regular rate and rhythm without murmurs, gallops, or  rubs noted.  ABDOMEN:  Soft, nontender, and nondistended.  Bowel sounds are present.  EXTREMITIES:  No evidence of edema.  Pulses are 2+ in all extremities.   DIAGNOSTIC STUDIES:  A 12-lead EKG obtained in our office today shows  normal sinus rhythm with low-voltage QRS.  There are no ischemic changes  noted.  All intervals are within normal limits.   ASSESSMENT AND PLAN:  This is a pleasant 45 year old female with past  medical history significant for hyperlipidemia, anemia, who presents  with complaints of occasional palpitations that sounds most consistent  with premature ventricular contractions or premature atrial  contractions.  The patient denies  any episodes of near syncope or  syncope with these events.  I think it is most likely that her symptoms  are benign.  However, I think it will be reasonable to obtain a surface  echocardiogram to assess her left ventricular size and function.  I  would also like to obtain a 48-hour Holter monitor to document the  abnormality in her rhythm.  This will serve both to rule out any  malignant arrhythmias and also to document the presence of premature  beats.  She has already had testing of her thyroid function in her  primary care physician's office and this was within normal limits.  I  would like to see her back in our office in 3-4 weeks to review the  results of her Holter monitor and her echocardiogram.  She is alerted  that she should call our office if she has any change in her clinical  status.  I also have encouraged her to continue to follow with her  primary care physician.     Verne Carrow, MD  Electronically Signed    CM/MedQ  DD: 05/03/2008  DT: 05/04/2008  Job #: [id number]   cc:   Willow Ora, MD

## 2010-10-30 NOTE — Op Note (Signed)
NAME:  [name], [name]        ACCOUNT NO.:  [id number]   MEDICAL RECORD NO.:  [id number]          PATIENT TYPE:  AMB   LOCATION:  SDS                          FACILITY:  [facility]   PHYSICIAN:  Donalee Citrin, M.D.        DATE OF BIRTH:  Dec 10, 1965   DATE OF PROCEDURE:  05/03/2005  DATE OF DISCHARGE:                                 OPERATIVE REPORT   PREOPERATIVE DIAGNOSIS:  Left S1 radiculopathy from ruptured disc L5-S1  left.   PROCEDURE:  Lumbar laminectomy and microdiscectomy L5-S1 left with  microscopic dissection of left S1 nerve root, microscopic discectomy.   SURGEON:  Donalee Citrin, M.D.   ASSISTANT:  Kathaleen Maser. Pool, M.D.   ANESTHESIA:  General endotracheal anesthesia.   HISTORY OF PRESENT ILLNESS:  Patient is a pleasant 45 year old female who  has had several years of longstanding back and left leg pain radiating down  the outside of her foot and __________ with numbness and tingling in the  same distribution.  Previous of degenerative at L4-5 and L5-S1 with disc  herniation compressing the left S1 nerve root.  Pain failed all forms of  conservative treatment. Recommend laminectomy and microdiscectomy.  Risks  and benefits discussed with the patient and she understood and agreed to  proceed forward.   Patient brought to the OR where she received general anesthesia, positioned  prone on the Wilson frame.  Back prepped in the usual sterile fashion.  I  localized the L5-S1 disc space.  After infiltration with 10 mL lidocaine  with epinephrine a midline incision was made. Bovie electrocautery was used  to taken down through subcutaneous tissue, subperiosteal dissection carried  out to the lamina of L5 and S1 on the left side.  Intraoperative x-ray  confirmed localization of the L5-S1 disc space.  Then the inferior aspect of  the lamina of L5 medial facet complex superior aspect of the S1 was removed  and ligamentum flavum was removed piecemeal fashion. Then the operating  microscope was draped, brought into the field and under microscopic  illumination the S1 nerve root was identified and the S1 pedicle was  identified.  Then using a #4 Penfield, the S1 nerve root was dissected off a  large bulbous distal fragment contained within the ligament and reflected  medially with D'Errico nerve root retractor.  Then the annulotomy was made  with 11 blade scalpel.  Several large fragments of disc removed from the  central compartment.  Then using Epstein curette, pituitary rongeurs and  disc spaces were adequately cleaned out decompressing the thecal sac and S1  nerve root.  In the end, discectomy was done for stenosis of the S1 nerve  root.  Wound was copiously irrigated.  Meticulous hemostasis was maintained.  Gelfoam was __________ topically.  Muscle fascia reapproximated in layers with interrupted Vicryl and the skin  was closed with running 4-0 subcuticular.  Benzoin and Steri-Strips applied.  Patient taken to the recovery room in stable condition.  Sponge, needle and  instrument counts were correct.           ______________________________  Donalee Citrin, M.D.  GC/MEDQ  D:  05/03/2005  T:  05/03/2005  Job:  [id number]   cc:   Gabriel Earing, M.D.  Fax: [phone]

## 2010-10-30 NOTE — H&P (Signed)
NAME:  [name], [name]                   ACCOUNT NO.:  [id number]   MEDICAL RECORD NO.:  [id number]                   PATIENT TYPE:   LOCATION:                                       FACILITY:   PHYSICIAN:  Juan H. Lily Peer, M.D.             DATE OF BIRTH:   DATE OF ADMISSION:  01/31/2004  DATE OF DISCHARGE:                                HISTORY & PHYSICAL   CHIEF COMPLAINT:  Symptomatic cystocele/stress urinary incontinence.   HISTORY OF PRESENT ILLNESS:  The patient is a 45 year old gravida 2, para 2  who on June 22, 2003, underwent transvaginal hysterectomy with posterior  colporrhaphy secondary to uterine prolapse and rectocele.  There was no  evidence of a cystocele at that point and the patient was not complaining of  any urinary incontinence.  She continues to do heavy lifting at her work and  for several months she has now been complaining of urgency at times to  urinate but no stress incontinence per se.  She did have a bladder infection  for which she was recently treated.  She returned back to the office on  October 01, 2003, complaining of urgency with urination and the feeling that  something was getting ready to fall out of her vagina.  Her urinalysis was  negative with the exception of some trace blood in her urine.  When she was  examined sitting in supine position, she appears to have a second degree  cystocele with good support and the posterior colporrhaphy was intact.  She  stated she still has the urgency.  I was concerned whether she may have a  mixed component of urge incontinence with anatomical stress urinary  incontinence.  On further questioning she denied any nocturia and she states  that she does wear a pad because if she sneezes, coughs or when she finishes  urinating, sometimes she leaks.  She was referred for one of my urological  colleagues, Jamison Neighbor, M.D., who was kind enough to see her in  consultation and his impressions were as  follows.  [name] had a  urodynamics and did show a diminished leak peak point pressure but not much  in the way of uncontrolled bladder contractility.  His impression was that  she should go ahead and have a stress incontinence repair as well as the  cystocele repair.  His impression was also that she will be a candidate for  one of the newer mesh sling repairs at the time of the anterior repair.  The  patient is scheduled to undergo anterior colporrhaphy sling procedure this  coming Friday, January 31, 2004, at 7:30, at [facility].   ALLERGIES:  No known drug allergies.   MEDICATIONS:  She takes Lipitor for hypercholesterolemia and Cataflam for  occasional dysmenorrhea.   PAST MEDICAL HISTORY:  1. She has had two normal spontaneous vaginal deliveries.  2. She had laparoscopic tubal sterilization procedure.  3. She had laparoscopic  cholecystectomy in January of this year.  4. She had transvaginal hysterectomy, posterior colporrhaphy on July 02, 2003.   FAMILY HISTORY:  Sister with history of hypertension.   PHYSICAL EXAMINATION:  VITAL SIGNS:  The patient is 5 feet 8 inches tall,  approximately 144 pounds.  HEENT:  Unremarkable.  NECK:  Supple.  Trachea midline.  No carotid bruits.  No thyromegaly.  LUNGS:  Clear to auscultation without any rhonchi or wheezes.  CARDIOVASCULAR:  Regular rate and rhythm with no murmurs or gallops.  BREASTS:  Examination not done.  ABDOMEN:  Soft, nontender without rebound or guarding.  PELVIC:  Examination as described above.  RECTAL:  Examination deferred.   ASSESSMENT:  A 45 year old gravida 2, para 2 status post transvaginal  hysterectomy with a posterior colporrhaphy secondary to uterine prolapse and  rectocele.  The patient, at that time, did not have any evidence of  cystocele or any stress incontinence and recently started complaining of  bulging sensation in her vagina consistent with second degree cystocele and   what appears to be stress incontinence as well.  The patient has also been  in consultation with Dr. Marcelyn Bruins who will be assisting me during the  anterior colporrhaphy and he will proceed by putting in one of the slings to  help with her incontinence.  The risks, benefits, pros and cons of the  operating room were discussed with the patient to include infection,  bleeding, trauma to internal organs, to include bladder, rectum, nerves or  blood vessels in the event of uncontrollable hemorrhage and she were to need  a blood transfusion.  She is fully aware of anaphylactic reaction from donor  blood as well as hepatitis and acquired immunodeficiency syndrome.  Also the  risk of deep venous thrombosis, although for prophylaxis she will have PSA  stockings and for infection she will receive intravenous antibiotic for  prophylaxis as well.  All these issues were discussed with the patient in  Spanish and all questions were answered and will follow accordingly.   PLAN:  The patient scheduled for anterior colporrhaphy sling procedure  Friday, January 31, 2004, at 7:30 a.m. at [facility].                                               Raymond H. Lily Peer, M.D.    JHF/MEDQ  D:  01/29/2004  T:  01/29/2004  Job:  [id number]

## 2010-10-30 NOTE — Op Note (Signed)
NAME:  [name], [name]                    ACCOUNT NO.:  [id number]   MEDICAL RECORD NO.:  [id number]                   PATIENT TYPE:  OBV   LOCATION:  0442                                 FACILITY:  [facility]   PHYSICIAN:  Jimmye Norman III, M.D.               DATE OF BIRTH:  04/02/1966   DATE OF PROCEDURE:  12/28/2002  DATE OF DISCHARGE:                                 OPERATIVE REPORT   PREOPERATIVE DIAGNOSIS:  Symptomatic cholelithiasis.   POSTOPERATIVE DIAGNOSIS:  Symptomatic cholelithiasis with likely chronic  cholecystitis.   PROCEDURE:  Laparoscopic cholecystectomy.   SURGEON:  Jimmye Norman, M.D.   ASSISTANT:  Rose Phi. Maple Hudson, M.D.   ANESTHESIA:  General endotracheal.   ESTIMATED BLOOD LOSS:  Less than 20 mL.   COMPLICATIONS:  None.   CONDITION:  Stable.   INDICATION FOR OPERATION:  The patient is a 45 year old with abdominal pain  in the epigastrium and known gallstones, who comes in for an elective  laparoscopic cholecystectomy.   FINDINGS:  The patient had one solitary stone lodged in the gallbladder.  No  evidence of acute disease but evidence of chronic cholecystitis.   OPERATION:  The patient was taken to the operating room and placed on the  table in supine position.  After an adequate endotracheal anesthetic was  administered, she was prepped and draped in the usual sterile manner  exposing the midline of the abdomen.   A supraumbilical curvilinear incision was made using a #15 blade and taken  down to the midline fascia and it was through this midline fascia that an  attempted Veress needle insertion was placed; however, we could never get an  adequate saline test or carbon dioxide insufflation.  After two failed  attempts, a Hasson technique was used to pass a Hasson cannula into the  peritoneal cavity with some difficulty because of some preperitoneal  dissection.  However, we were able to get an intraperitoneal position of the  Hasson cannula and  then subsequently passed two right costal margin 5 mm  cannulas and a subxiphoid 11/12 mm cannula under direct vision into the  peritoneal cavity.  Once they were all in adequate position, the patient was  placed in a reverse Trendelenburg.  The left side was tilted down.   The dome of the gallbladder was grasped using a ratcheted grasper through  the lateralmost 5 mm cannula.  We used counter traction with a second  grasper on the infundibulum through the 5 mm cannula, opening up the  triangle of Calot and the ligament of peritoneum overlying the  hepatoduodenal triangle.  We were able to dissect out the cystic duct and  the cystic artery adequately, getting excellent windows on both sides in  both structures.  We doubly ligated the cystic duct distally and then  proximally and then transected it, doing the same with the cystic artery.  We then dissected the gallbladder out of its bed  with minimal difficulty,  obtaining hemostasis using electrocautery.  We brought the gallbladder out  through the supraumbilical site with minimal difficulty.  Upon doing so we  saw that there omental adhesions to the periumbilical area, likely  accounting for the difficulty in inserting the Veress needle, and therefore  we took this down using cauterized scissors.   The supraumbilical site was closed using the pursestring suture that had  been put in place to hold in the Hasson cannula.  We irrigated with about  0.5 L of warm saline solution, then closed all sites.   Marcaine 0.25% was injected at all skin sites.  Subsequent to this we closed  the supraumbilical site and the subxiphoid site using running subcuticular  stitch of 4-0 Vicryl.  The lateral trocar sites were closed with Steri-  Strips only.  Sterile dressings were applied.                                               Kathrin Ruddy, M.D.    JW/MEDQ  D:  12/28/2002  T:  12/29/2002  Job:  [id number]   cc:   Anselmo Rod, M.D.  [address]  [address]  [place]  [place] [address]  Fax: [phone]

## 2010-10-30 NOTE — Op Note (Signed)
NAME:  [name], [name]                    ACCOUNT NO.:  [id number]   MEDICAL RECORD NO.:  [id number]                   PATIENT TYPE:  INP   LOCATION:  9302                                 FACILITY:  [facility]   PHYSICIAN:  Juan H. Lily Peer, M.D.             DATE OF BIRTH:  Sep 29, 1965   DATE OF PROCEDURE:  07/02/2003  DATE OF DISCHARGE:                                 OPERATIVE REPORT   PREOPERATIVE DIAGNOSES:  1. Uterine prolapse.  2. Rectocele.   POSTOPERATIVE DIAGNOSES:  1. Uterine prolapse.  2. Rectocele.   ANESTHESIA:  General endotracheal anesthesia.   PROCEDURE:  Transvaginal hysterectomy with posterior colporrhaphy.   SURGEON:  Juan H. Lily Peer, M.D.   INDICATION FOR OPERATION:  A 45 year old gravida 2, para 2, with symptomatic  uterine prolapse and evidence of rectocele.   FINDINGS:  Uterine descensus to the cervix to the level of the internal os  with evidence of rectocele.  No enterocele or cystocele was evident.  Normal-  appearing ovaries.   DESCRIPTION OF OPERATION:  After the patient was adequately counseled, she  was taken to the operating room, where she underwent a successful general  endotracheal anesthesia.  She had received 1 g of Cefotan for prophylaxis  and had PAS stockings for DVT prophylaxis as well.  She was placed in the  high lithotomy position and the vagina and perineum were prepped and draped  in the usual sterile fashion.  A Foley catheter was inserted and a short,  weighted bill speculum was placed in the posterior vaginal vault and a  Deaver retractor for exposure.  Two Lahey thyroid clamps were placed on the  anterior and posterior cervical lip, respectively, and the cervicovaginal  fold was infiltrated with 2% Xylocaine with 1:100,000 epinephrine in a  circumferential fashion.  At the cervicovaginal fold a circumferential  incision was made and the posterior colpotomy was established, and both  uterosacral ligaments were clamped, cut,  and suture ligated with 0 Vicryl  suture, and the cervicovaginal fascia was separated from the bladder and the  peritoneum was entered cautiously.  The remainder of the broad and cardinal  ligaments were serially clamped, cut, and suture ligated wit 0 Vicryl  suture.  Both triple pedicles were secured, first with a free tie of 0  Vicryl suture, followed by transfixion stitch of 0 Vicryl suture.  The  uterus and cervix were passed off the operative field and after ascertaining  adequate hemostasis, the posterior vaginal cuff was secured with a running  locked stitch in a baseball fashion with 1-0 Vicryl suture.  A McCall  culdoplasty was established using 1-0 Vicryl suture and was not tied until  the end of the case.  This incorporated posterior vagina and reefing the  peritoneum and uterosacral ligaments.  The vaginal cuff was then closed with  interrupted figure-of-eights of 0 Vicryl suture.  Attention was then placed  to the posterior colpotomy.  A V-shaped incision was  made from the angles of  the introitus over the perineal body.  The midline vertical incision  extended to the apex of the vagina.  The perirectal fascia was separated  from the mucosa by sharp dissection, and plication over the anterior wall of  the rectum was accomplished with 1-0 Vicryl suture.  The levator ani muscles  were approximated with 1-0 Vicryl suture as well in the midline.  The excess  vaginal mucosa was excised and the vaginal mucosa was closed with a running  stitch of 0 Vicryl suture.  The vagina was packed with gauze impregnated  with Premarin cream.  The sponge count and needle count were correct.  The  patient tolerated the procedure well.  She was transferred to the recovery  room with stable vital signs after she was extubated.  Blood loss was 100  mL.  IV fluid was 2 L of lactated Ringer's.  Urine output was 150 mL and  clear.                                               Juan H. Lily Peer,  M.D.    JHF/MEDQ  D:  07/03/2003  T:  07/03/2003  Job:  [id number]

## 2010-10-30 NOTE — Discharge Summary (Signed)
NAME:  [name], [name]        ACCOUNT NO.:  [id number]   MEDICAL RECORD NO.:  [id number]          PATIENT TYPE:  INP   LOCATION:  3006                         FACILITY:  [facility]   PHYSICIAN:  Donalee Citrin, M.D.        DATE OF BIRTH:  28-May-1966   DATE OF ADMISSION:  11/14/2006  DATE OF DISCHARGE:  11/16/2006                               DISCHARGE SUMMARY   ADMITTING DIAGNOSES:  1. Recurrent disk herniation.  2. Diskogenic mechanical low back pain with lumbar radiculopathy at L4-      5 and L5 S1.   PROCEDURE:  Decompressive lumbar laminectomy L4-5 and L5 S1 with  posterior lumbar interbody fusion L4-5 and L5 S1.   HOSPITAL COURSE:  The patient is a very pleasant 45 year old female who  was admitted __________ , went to the operating room and underwent the  aforementioned procedure.  Postop the patient did very well in recovery  and on the floor.  On the floor the patient was progressively mobilized  with physical and occupational therapy.  Pain in her legs was  significantly improved from preop, was complaining of a lot of back pain  but this was improving and controlled initially with IV pain medication  but switched over to orals with muscle relaxers.  As the patient  progressed with physical therapy, was able to be discharged home on day  2 under the care of her family ambulating and voiding spontaneously,  tolerating pain on oral medication and set up with followup in  approximately 2 weeks with Neurosurgery.           ______________________________  Donalee Citrin, M.D.     GC/MEDQ  D:  12/29/2006  T:  12/29/2006  Job:  [id number]

## 2010-10-30 NOTE — Discharge Summary (Signed)
NAME:  [name], [name]                    ACCOUNT NO.:  [id number]   MEDICAL RECORD NO.:  [id number]                   PATIENT TYPE:  INP   LOCATION:  9302                                 FACILITY:  [facility]   PHYSICIAN:  Juan H. Lily Peer, M.D.             DATE OF BIRTH:  [birth year]   DATE OF ADMISSION:  07/02/2003  DATE OF DISCHARGE:  07/04/2003                                 DISCHARGE SUMMARY   DISCHARGE DIAGNOSES:  1. Uterine prolapse.  2. Rectocele.  3. Status post transvaginal hysterectomy and posterior colporrhaphy by Dr.     Reynaldo Minium on July 02, 2003.   HISTORY OF PRESENT ILLNESS:  This is a 45 year old female gravida 2, para 2,  who presented to the office complaining of sensation of something falling  out and pulling on the vagina. She was found to have first and second degree  rectocele and evidence of uterine prolapse,was in the erect position, found  significant descensus of the uterus and cervix encroaching through the  introitus.  __________ the patient was given a pessary which unfortunately  caused constipation and the patient desired definitive surgery.   HOSPITAL COURSE:  On July 02, 2003, the patient underwent a transvaginal  hysterectomy with posterior colporrhaphy by Dr. Reed Breech without  complications.   Postoperatively, the patient remained afebrile, voiding in stable condition.  She was discharged to home on July 04, 2003, in satisfactory condition  and given [facility] Gynecology instructions.   LABORATORY DATA:  On July 03, 2003, hemoglobin was 11.3.   DISPOSITION:  The patient was discharged to home, given Lortab p.r.n.  She  was to use Dulcolax suppositories p.r.n. and to follow up in the office in  two weeks.     Susa Loffler, P.A.                    Juan H. Lily Peer, M.D.    TSG/MEDQ  D:  07/29/2003  T:  07/29/2003  Job:  [id number]

## 2010-10-30 NOTE — Op Note (Signed)
NAME:  [name], [name]                    ACCOUNT NO.:  [id number]   MEDICAL RECORD NO.:  [id number]                   PATIENT TYPE:  AMB   LOCATION:  DAY                                  [facility]   PHYSICIAN:  Jamison Neighbor, M.D.               DATE OF BIRTH:  Oct 21, 1965   DATE OF PROCEDURE:  01/31/2004  DATE OF DISCHARGE:                                 OPERATIVE REPORT   PREOPERATIVE DIAGNOSES:  1. Stress urinary incontinence.  2. Cystocele.   POSTOPERATIVE DIAGNOSES:  1. Stress urinary incontinence.  2. Cystocele.   PROCEDURE:  1. Anterior repair, Dr. Reynaldo Minium, primary surgery.  2. Cystoscopy and transvaginal sling, Dr. Marcelyn Bruins, primary surgery.   Both repairs were done with both surgeons working as co-surgeons.   ANESTHESIA:  General.   COMPLICATIONS:  None.   DRAINS:  None.   BRIEF HISTORY:  This 45 year old female underwent a recent hysterectomy and  following the procedure, did begin to develop some stress urinary  incontinence.  The patient had no evidence of urinary incontinence prior to  the procedure.  The patient was carefully evaluated, and it was felt that  she had a modest cystocele as well as some urethral height mobility.  It was  felt that an anterior repair plus sling would take care of her problem.  The  patient had no evidence of enterocele and no evidence of rectocele and had  no evidence of bulk prolapse.  The patient is to undergo TVT sling plus  anterior repair.  She understands the risks and benefits of the procedure  and gave full, informed consent   PROCEDURE:  After successful induction of general anesthesia, the patient  was placed in the dorsal lithotomy position and prepped with Betadine,  draped in the usual sterile fashion.  A careful inspection shows the patient  did have a cystocele plus urethral hypermobility.  A Foley catheter was  placed, and the bladder was drained.  An anterior vaginal incision was made  by Dr. Lily Peer, who elevated flaps bilaterally, exposing the central and  lateral defect.  The patient had a standard cystocele repair done with  excellent support for the urethra obtained.  The patient maintained good  fault length, and no prolapse was identified.  The endopelvic fascia was  then identified.  Two small incisions were made by Dr. Logan Bores in the  suprapubic area.  The guide needle was passed from the suprapubic area down  to the vaginal opening.  The TVT sling was then advanced to the endopelvic  fascia through the retropubic space and out through the two small stab  incisions.  The patient had a cystoscopic examination before and after  passage of the sling, and there was no evidence of any injury to the bladder  or passage of needles or sling material through the bladder.  The sling  appeared to be in appropriate position at the major urethra.  Tension  was  set by cutting away the protective sleeve.  A space between the urethra was  left so that a urethral dilator could be passed between the urethra and a  sling.  The area was carefully irrigated. The mucosa was trimmed slightly  with very little tissue removed.  A running suture of 2-0 Vicryl was used to  close the anterior vaginal mucosa.  The patient had packing applied.  The  Foley catheter was left out.  The patient will be sent home without a  catheter if she urinates normally.  The patient was taken to the recovery  room in good condition.  She will be sent home with Lortab plus Cipro.  She  will return to the office in 2-3 weeks.  She will be kept out from any heavy  lifting for six weeks.                                               Jamison Neighbor, M.D.    RJE/MEDQ  D:  01/31/2004  T:  01/31/2004  Job:  [id number]

## 2010-10-30 NOTE — H&P (Signed)
NAME:  [name], [name]                    ACCOUNT NO.:  [id number]   MEDICAL RECORD NO.:  [id number]                   PATIENT TYPE:  INP   LOCATION:  NA                                   FACILITY:  [facility]   PHYSICIAN:  Juan H. Lily Peer, M.D.             DATE OF BIRTH:  May 11, 1966   DATE OF ADMISSION:  DATE OF DISCHARGE:                                HISTORY & PHYSICAL   PREOPERATIVE HISTORY AND PHYSICAL:  Patient scheduled for surgery Tuesday,  January 18 at 7: 30 a.m.   CHIEF COMPLAINT:  Symptomatic uterine prolapse and rectocele.   HISTORY:  The patient is a 45 year old gravida 2, para 2 who in October 2004  was seen in the office complaining of a sensation of something falling out  and pulling. She was examined and found to have no palpable adnexal masses,  normal uterus, but a first-to-second degree rectocele and evidence of  uterine prolapse.  She was then examined in the erect position and  significant descensus of the uterus and cervix was noted approaching the  introitus.  Bimanual exam with 1 finger in the vagina and another in the  rectum did not demonstrate any evidence of an enterocele.  She stated that  [place] and we temporarily gave her a pessary and  she wanted to schedule the surgery, now, for January 2005. She had had some  issues with constipation so she had removed the pessary.  The patient is  scheduled to undergo a transvaginal hysterectomy and posterior colporrhaphy  on Tuesday January 18.  She was seen for preoperative consultation.  The  risks, benefits, and pros-and-cons of the operation were discussed and she  was placed on Premarin vaginal cream.   PAST MEDICAL HISTORY:  She has had 2 normal spontaneous vaginal deliveries.  She has had a laparoscopic tubal sterilization procedure and a laparoscopic  cholecystectomy.   FAMILY HISTORY:  A sister with history of hypertension.   MEDICATIONS:  1. Lipitor for  hypercholesterolemia.  2. Cataflam for occasional dysmenorrhea.   PHYSICAL EXAMINATION:  VITAL SIGNS:  The patient is 5  feet 8 inches tall,  144 pounds.  Blood pressure was 98/52.  HEENT:  Unremarkable.  NECK:  Supple.  Trachea midline.  No carotid bruits, no thyromegaly.  LUNGS:  The lungs were clear to auscultation without rhonchi or wheezes.  HEART:  Regular rate and rhythm.  No murmurs or gallops.  BREASTS:  Examination was done at the time of her annual exam and was  reported to be normal.  ABDOMEN:  Soft, nontender, without rebound or guarding.  PELVIC:  Bartholin, urethra and Skene glands were within normal limits.  Vagina, cervix, uterus, and adnexa as described above.  RECTAL:  Exam as described above.  EXTREMITIES:  No __________, no edema.   ASSESSMENT:  A 45 year old gravida 2, para 2 with uterine descensus causing  pressure sensation and also with evidence of  rectocele and 2 normal  spontaneous vaginal deliveries.  The patient denied any history of stress  urinary incontinence.  She did have an ultrasound in January of last year  which was normal and, again, examination preoperatively demonstrated normal  size uterus.  The risks, benefits, and pros and cons of the operation were  discussed with the patient to include:  Infection (she will receive  prophylaxis antibiotic); the risk of deep venous thrombosis (she will have  pneumatic compression stockings for prophylaxis); the risk of hemorrhage in  the event that she would need blood transfusion or blood products.  She is  fully aware of the risk for anaphylactic reaction, hepatitis and AIDS; and  also the risk of trauma to the bladder requiring surgery; or trauma to the  rectum or nearby structures.  All of the above were discussed with the  patient.  Instructions were provided in Spanish.  Also, in the event of the  difficulty completing the operation vaginally she is fully aware that we may  need to finish the  operation with an abdominal approach.  Also, the risks  for dyspareunia, constipation, and fistula were also discussed as potential  risks.  All questions were answered and we will follow accordingly.   PLAN:  The patient is scheduled for a transvaginal hysterectomy and  posterior colporrhaphy on Tuesday January 18 at 7:30 a.m. at [facility].                                               Endicott H. Lily Peer, M.D.    JHF/MEDQ  D:  07/01/2003  T:  07/01/2003  Job:  [id number]

## 2010-10-30 NOTE — Op Note (Signed)
NAME:  [name], [name]                    ACCOUNT NO.:  [id number]   MEDICAL RECORD NO.:  [id number]                   PATIENT TYPE:  AMB   LOCATION:  DSC                                  FACILITY:  [facility]   PHYSICIAN:  Lucky Cowboy, M.D.                    DATE OF BIRTH:  01/15/1966   DATE OF PROCEDURE:  10/26/2002  DATE OF DISCHARGE:                                 OPERATIVE REPORT   PREOPERATIVE DIAGNOSIS:  Chronic tonsillitis.   POSTOPERATIVE DIAGNOSIS:  Chronic tonsillitis.   PROCEDURE:  Tonsillectomy.   SURGEON:  Lucky Cowboy, M.D.   ANESTHESIA:  General endotracheal anesthesia.   ESTIMATED BLOOD LOSS:  Less 10 mL.   SPECIMENS:  Tonsils.   COMPLICATIONS:  None.   INDICATIONS:  This patient is a 45 year old female who has had innumerous  episodes of tonsillitis with some of them being positive for strep.  She is  now experiencing a foreign body sensation at the level of the tonsils all of  the time, which is bothersome when she swallows.  She is also experiencing  frequent sore throat.  For these reasons, tonsillectomy is performed.   FINDINGS:  The patient was noted to have cryptic and quite enlarged  bilateral palatine tonsils.   PROCEDURE:  The patient was taken to the operating room and placed on the  table in the supine position.  She was then placed under general  endotracheal anesthesia and the table rotated counterclockwise 90 degrees.  The neck was gently extended using a shoulder roll.  The head and body were  draped in the usual fashion.  Bacitracin ointment was placed on the lips.  A  Crowe-Davis mouth gag with a #4 tongue blade was then placed intraorally,  was opened and suspended on the Mayo stand.  The right palatine tonsil was  grasped with Allis clamps and directed inferomedially.  The harmonic scalpel  was then used to excise the tonsils, staying within the peritonsillar space.  The left palatine tonsil was removed in an identical fashion. There  were two  very small arterial bleeders that were controlled with suction cautery.  The  mouth gag was then relaxed and then reopened and the mouth irrigated.  There  was no residual bleeding.  An NG tube was passed down the esophagus for  suction of the gastric contents.  The  mouth gag was then removed and the table rotated clockwise 90 degrees to its  original position.  The patient was then awakened from anesthesia and taken  to the postanesthesia care unit in stable condition.  There were no  complications.                                               Lucky Cowboy, M.D.    SJ/MEDQ  D:  10/26/2002  T:  10/27/2002  Job:  [id number]   cc:   Gabriel Earing, M.D.  [address]  [place]  [place] [address]  Fax: [phone]   Barbette Hair. Arlyce Dice, M.D. [facility]

## 2011-04-28 ENCOUNTER — Encounter: Payer: Self-pay | Admitting: Anesthesiology

## 2011-04-28 DIAGNOSIS — E7800 Pure hypercholesterolemia, unspecified: Secondary | ICD-10-CM | POA: Insufficient documentation

## 2011-04-29 ENCOUNTER — Ambulatory Visit: Payer: Self-pay | Admitting: Gastroenterology

## 2011-05-03 ENCOUNTER — Ambulatory Visit (INDEPENDENT_AMBULATORY_CARE_PROVIDER_SITE_OTHER): Payer: 59 | Admitting: Gynecology

## 2011-05-03 ENCOUNTER — Other Ambulatory Visit (HOSPITAL_COMMUNITY)
Admission: RE | Admit: 2011-05-03 | Discharge: 2011-05-03 | Disposition: A | Discharge status: Home / Self Care | Payer: 59 | Source: Ambulatory Visit | Attending: Gynecology | Admitting: Gynecology

## 2011-05-03 ENCOUNTER — Encounter: Payer: Self-pay | Admitting: Gynecology

## 2011-05-03 VITALS — BP 120/84 | Ht 59.25 in | Wt 177.0 lb

## 2011-05-03 DIAGNOSIS — Z01419 Encounter for gynecological examination (general) (routine) without abnormal findings: Secondary | ICD-10-CM | POA: Insufficient documentation

## 2011-05-03 DIAGNOSIS — E039 Hypothyroidism, unspecified: Secondary | ICD-10-CM

## 2011-05-03 DIAGNOSIS — K219 Gastro-esophageal reflux disease without esophagitis: Secondary | ICD-10-CM | POA: Insufficient documentation

## 2011-05-03 DIAGNOSIS — K921 Melena: Secondary | ICD-10-CM

## 2011-05-03 DIAGNOSIS — M797 Fibromyalgia: Secondary | ICD-10-CM | POA: Insufficient documentation

## 2011-05-03 DIAGNOSIS — E559 Vitamin D deficiency, unspecified: Secondary | ICD-10-CM

## 2011-05-03 DIAGNOSIS — R635 Abnormal weight gain: Secondary | ICD-10-CM

## 2011-05-03 NOTE — Progress Notes (Signed)
[name] 1965-11-22 [id number]   History:    45 y.o.  for annual exam with the only complaint of a rash underneath her armpit that comes and goes. Patient has been followed by Dr. Drue Novel her primary physician who did her lab work 3 months ago. Patient's last mammogram was in June 2012. She in frequently does her self breast examination. Patient with past history of TVH/posterior colporrhaphy/TVT sling procedure. Review of her record again she was weighing 163 is up to 177. Colonoscopy in 2009 was normal. FSH last year normal. Bone density study 2009 normal. Review of labs done in January of this year demonstrated her TSH was slightly elevated at 4.69. Patient has had history vitamin D deficiency correct in the past and she's taking calcium and vitamin D with additional vitamin D 3 2000 units daily.  Past medical history,surgical history, family history and social history were all reviewed and documented in the EPIC chart.  Gynecologic History No LMP recorded. Patient has had a hysterectomy. Contraception: Hysterectomy Last Pap: 2011. Results were: normal Last mammogram: 2012. Results were: normal  Obstetric History OB History    Grav Para Term Preterm Abortions TAB SAB Ect Mult Living   2 2 2       2      # Outc Date GA Lbr Len/2nd Wgt Sex Del Anes PTL Lv   1 TRM     F SVD  No Yes   2 TRM     M SVD  No Yes       ROS:  Was performed and pertinent positives and negatives are included in the history.  Exam: chaperone present  BP 120/84  Ht 4' 11.25" (1.505 m)  Wt 177 lb (80.287 kg)  BMI 35.45 kg/m2  Body mass index is 35.45 kg/(m^2).  General appearance : Well developed well nourished female. No acute distress HEENT: Neck supple, trachea midline, no carotid bruits, no thyroidmegaly Lungs: Clear to auscultation, no rhonchi or wheezes, or rib retractions  Heart: Regular rate and rhythm, no murmurs or gallops Breast:Examined in sitting and supine position were symmetrical  in appearance, no palpable masses or tenderness,  no skin retraction, no nipple inversion, no nipple discharge, no skin discoloration, no axillary or supraclavicular lymphadenopathy Abdomen: no palpable masses or tenderness, no rebound or guarding Extremities: no edema or skin discoloration or tenderness  Pelvic:  Bartholin, Urethra, Skene Glands: Within normal limits             Vagina: No gross lesions or discharge  Cervix: Absent  Uterus absent  Adnexa  Without masses or tenderness  Anus and perineum  normal   Rectovaginal  normal sphincter tone without palpated masses or tenderness             Hemoccult obtained results pending at time of this dictation     Assessment/Plan:  45 y.o. female for annual exam with slight monilial like infection on armpit. We'll place her on mytrex cream to apply twice a day for 7-10 days. Offer her to use milk of magnesia instead of deodorant . If her symptoms  continue she would need to seen by a dermatologist. Since we reviewed her labs from January her TSH was elevated at 4.69 we're going to check her TSH today along with her vitamin D since she's had history vitamin D deficiency in the past. She will followup with her primary physician Dr. Drue Novel in January. Fecal occult blood testing as well as Pap smear done today results pending  at time of this dictation and she was encouraged to do her monthly self breast examination.    Ok Edwards MD, 5:37 PM 05/03/2011

## 2011-05-04 LAB — VITAMIN D 25 HYDROXY (VIT D DEFICIENCY, FRACTURES): Vit D, 25-Hydroxy: 33 ng/mL (ref 30–89)

## 2011-05-04 LAB — POC HEMOCCULT BLD/STL (OFFICE/1-CARD/DIAGNOSTIC): Fecal Occult Blood, POC: POSITIVE

## 2011-05-12 ENCOUNTER — Other Ambulatory Visit (INDEPENDENT_AMBULATORY_CARE_PROVIDER_SITE_OTHER): Payer: 59 | Admitting: Gynecology

## 2011-05-12 DIAGNOSIS — K921 Melena: Secondary | ICD-10-CM

## 2011-05-12 LAB — POC HEMOCCULT BLD/STL (OFFICE/1-CARD/DIAGNOSTIC): Fecal Occult Blood, POC: POSITIVE

## 2011-05-14 ENCOUNTER — Telehealth: Payer: Self-pay | Admitting: Gastroenterology

## 2011-05-14 NOTE — Telephone Encounter (Signed)
Pt scheduled to see Amy Esterwood PA 05/20/11@3 :30pm. Pt had her annual exam with her GYN and her stool cards were positive. Pt aware of appt date and time.

## 2011-05-20 ENCOUNTER — Other Ambulatory Visit (INDEPENDENT_AMBULATORY_CARE_PROVIDER_SITE_OTHER): Payer: 59

## 2011-05-20 ENCOUNTER — Ambulatory Visit (INDEPENDENT_AMBULATORY_CARE_PROVIDER_SITE_OTHER): Payer: 59 | Admitting: Physician Assistant

## 2011-05-20 ENCOUNTER — Encounter: Payer: Self-pay | Admitting: Physician Assistant

## 2011-05-20 DIAGNOSIS — R195 Other fecal abnormalities: Secondary | ICD-10-CM

## 2011-05-20 DIAGNOSIS — Z800 Family history of malignant neoplasm of digestive organs: Secondary | ICD-10-CM

## 2011-05-20 DIAGNOSIS — R1319 Other dysphagia: Secondary | ICD-10-CM

## 2011-05-20 LAB — CBC WITH DIFFERENTIAL/PLATELET
Basophils Absolute: 0 10*3/uL (ref 0.0–0.1)
Basophils Relative: 0.2 % (ref 0.0–3.0)
Eosinophils Absolute: 0.2 10*3/uL (ref 0.0–0.7)
Eosinophils Relative: 1.6 % (ref 0.0–5.0)
HCT: 38.4 % (ref 36.0–46.0)
Hemoglobin: 13.2 g/dL (ref 12.0–15.0)
Lymphocytes Relative: 27.9 % (ref 12.0–46.0)
Lymphs Abs: 2.7 10*3/uL (ref 0.7–4.0)
MCHC: 34.4 g/dL (ref 30.0–36.0)
MCV: 90.8 fl (ref 78.0–100.0)
Monocytes Absolute: 0.9 10*3/uL (ref 0.1–1.0)
Monocytes Relative: 8.8 % (ref 3.0–12.0)
Neutro Abs: 5.9 10*3/uL (ref 1.4–7.7)
Neutrophils Relative %: 61.5 % (ref 43.0–77.0)
Platelets: 387 10*3/uL (ref 150.0–400.0)
RBC: 4.23 Mil/uL (ref 3.87–5.11)
RDW: 12.6 % (ref 11.5–14.6)
WBC: 9.7 10*3/uL (ref 4.5–10.5)

## 2011-05-20 MED ORDER — OMEPRAZOLE 40 MG PO CPDR: 40.0000 mg | DELAYED_RELEASE_CAPSULE | Freq: Every day | ORAL | Status: DC

## 2011-05-20 MED ORDER — PEG-KCL-NACL-NASULF-NA ASC-C 100 G PO SOLR: ORAL | Status: DC

## 2011-05-20 NOTE — Patient Instructions (Signed)
Please go to the basement level to have your labs drawn.   We have scheduled the procedures with Dr. Yancey Flemings. Directions and brochure provided.  We sent prescriptions for the Omeprazole ( Prilosec 40 mg) increase to twice daily. Aslo the Moviprep for the colonoscopy to [facility].

## 2011-05-21 ENCOUNTER — Encounter: Payer: Self-pay | Admitting: Physician Assistant

## 2011-05-21 NOTE — Progress Notes (Signed)
Agree with assessment and plans as outlined.Geraldine Solar

## 2011-05-21 NOTE — Progress Notes (Signed)
Subjective:    Patient ID: [name], female    DOB: [birth year], 45 y.o.   MRN: [id number]  HPI; [name] is a pleasant 45 year old female known to Dr. Arlyce Dice from prior endoscopy done in 2003 which was normal and colonoscopy done in January of 2010 which was also normal. She has family history of colon cancer in her half-sister diagnosed in her 40s.. Patient is currently referred for finding of Hemoccult-positive stool after her evaluation with her gynecologist Dr. Lily Peer. Patient states that she has chronic problems with constipation however she does take narcotics for chronic back pain and is followed by the pain clinic. She had been taking Amitiza at  one point which worked  for a short period of time and then stopped working altogether. A t this time she is having a bowel movement every 3-4 days. She says occasionally she'll see a small amount of bright red blood on the tissue but has not noted any overt bleeding nor any melena. She does complain of fairly chronic problems with abdominal bloating. She has intermittent complaints of nausea as well as intermittent dysphasia. She says she feels as if her food" sticks" at times. Her appetite has been fine and she is actually been gaining weight. She is not taking any aspirin or NSAIDs. She is maintained on chronic omeprazole for acid reflux symptoms. She says she has been having a lot of heartburn over the past couple of months.    Review of Systems  Constitutional: Negative.   HENT: Positive for trouble swallowing.   Eyes: Negative.   Respiratory: Negative.   Cardiovascular: Negative.   Gastrointestinal: Positive for constipation.  Genitourinary: Negative.   Musculoskeletal: Positive for back pain and arthralgias.  Neurological: Negative.   Hematological: Negative.   Psychiatric/Behavioral: Negative.    Outpatient Prescriptions Prior to Visit  Medication Sig Dispense Refill  . cholecalciferol (VITAMIN D) 1000 UNITS tablet  Take 1,000 Units by mouth 2 (two) times daily.        . DULoxetine (CYMBALTA) 30 MG capsule Take 30 mg by mouth daily.        . fentaNYL (DURAGESIC - DOSED MCG/HR) 50 MCG/HR Place 1 patch onto the skin every 3 (three) days.        . fish oil-omega-3 fatty acids 1000 MG capsule Take 2 g by mouth daily.        Marland Kitchen lubiprostone (AMITIZA) 24 MCG capsule Take 24 mcg by mouth 2 (two) times daily with a meal.        . pravastatin (PRAVACHOL) 40 MG tablet Take 40 mg by mouth daily.        . simvastatin (ZOCOR) 80 MG tablet Take 80 mg by mouth at bedtime.       . traMADol (ULTRAM-ER) 100 MG 24 hr tablet Take 100 mg by mouth daily.        Marland Kitchen omeprazole (PRILOSEC) 40 MG capsule Take 40 mg by mouth daily.        Marland Kitchen oxyCODONE (OXYCONTIN) 10 MG 12 hr tablet Take 10 mg by mouth every 12 (twelve) hours.         Allergies  Allergen Reactions  . Other     LATEX       Active Ambulatory Problems    Diagnosis Date Noted  . HYPERLIPIDEMIA 09/15/2007  . SINUSITIS - ACUTE-NOS 12/04/2008  . ALLERGIC RHINITIS 10/22/2008  . DYSPEPSIA, CHRONIC 11/06/2008  . NECK PAIN 11/23/2007  . BACK PAIN 10/22/2008  . PALPITATIONS 02/23/2008  . ABDOMINAL  PAIN 09/15/2007  . Hypercholesterolemia   . Fibromyalgia   . Acid reflux   . Vitamin D deficiency 05/03/2011   Resolved Ambulatory Problems    Diagnosis Date Noted  . No Resolved Ambulatory Problems   Past Medical History  Diagnosis Date  . NSVD (normal spontaneous vaginal delivery)   . Constipation, chronic   . Depression   . Anxiety    Objective:   Physical Exam Well-developed Hispanic female pleasant in no acute distress, accompanied by her daughter. HEENT; non-traumatic normocephalic EOMI PERRLA sclera anicteric, Neck; is supple no JVD, Cardiovascular; regular rate and rhythm with S1-S2 no murmur rub or gallop, Pulmonary; clear bilaterally, Abdomen; soft, bowel sounds are active there is no focal tenderness no palpable mass or hepatosplenomegaly. Rectal; not done  recently documented Hemoccult-positive, Extremities; no clubbing cyanosis or edema, Psych; mood and affect normal and appropriate      Assessment & Plan:   #37 45 year old female with chronic constipation secondary to chronic narcotic use, and Hemoccult-positive stool. The patient did have a colonoscopy in January of 2010 which was normal but does have positive family history of colon cancer in a sibling diagnosed in her 105s. We'll need to rule out occult colon lesion. #2 Chronic GERD and intermittent dysphagia rule out esophageal stricture  Plan; Will schedule for colonoscopy and upper endoscopy. Patient will be scheduled with Dr. Marina Goodell, with propofol given chronic narcotic use. Procedure is scheduled with Dr. Marina Goodell in Dr. Marzetta Board absence. Procedures were discussed with the patient in detail, and she is agreeable to proceed  Have advised restarting MiraLax 17 g in 8 ounces of water, 2 doses daily on a regular basis, he was also advised she could use when necessary Peri-Colace. Continue omeprazole 40 mg on a will increase to twice daily short-term given current complaints of ongoing heartburn. Check CBC today.

## 2011-05-24 ENCOUNTER — Encounter: Payer: Self-pay | Admitting: Internal Medicine

## 2011-05-24 ENCOUNTER — Telehealth: Payer: Self-pay | Admitting: *Deleted

## 2011-05-24 NOTE — Telephone Encounter (Signed)
Patient is at work and will not be home until 4:30 PM per family. Will call patient back.

## 2011-05-24 NOTE — Telephone Encounter (Signed)
Message copied by Daphine Deutscher on Mon May 24, 2011  1:12 PM ------      Message from: Lyons, Virginia S      Created: Mon May 24, 2011 12:33 PM       Please let pt know her cbc is normal, she is not anemic

## 2011-05-25 NOTE — Telephone Encounter (Signed)
Spoke with patient's daughter and gave her lab results as per Mike Gip, PA

## 2011-06-01 ENCOUNTER — Encounter: Payer: Self-pay | Admitting: Internal Medicine

## 2011-06-01 ENCOUNTER — Ambulatory Visit (AMBULATORY_SURGERY_CENTER): Payer: 59 | Admitting: Internal Medicine

## 2011-06-01 DIAGNOSIS — Z800 Family history of malignant neoplasm of digestive organs: Secondary | ICD-10-CM

## 2011-06-01 DIAGNOSIS — R195 Other fecal abnormalities: Secondary | ICD-10-CM

## 2011-06-01 DIAGNOSIS — D126 Benign neoplasm of colon, unspecified: Secondary | ICD-10-CM

## 2011-06-01 DIAGNOSIS — K219 Gastro-esophageal reflux disease without esophagitis: Secondary | ICD-10-CM

## 2011-06-01 DIAGNOSIS — R1319 Other dysphagia: Secondary | ICD-10-CM

## 2011-06-01 MED ORDER — SODIUM CHLORIDE 0.9 % IV SOLN: 500.0000 mL | INTRAVENOUS | Status: DC

## 2011-06-01 NOTE — Op Note (Signed)
[facility] [address] [place], [place]  [address]  ENDOSCOPY PROCEDURE REPORT  PATIENT:  [name], [name]  MR#:  [id number] BIRTHDATE:  1965-08-10, 45 yrs. old  GENDER:  female  ENDOSCOPIST:  Wilhemina Bonito. Eda Keys, MD Referred by:  Reynaldo Minium, M.D.  PROCEDURE DATE:  06/01/2011 PROCEDURE:  EGD, diagnostic 43235 ASA CLASS:  Class II INDICATIONS:  hemoccult positive stool ; GERD; vague dysphagia  MEDICATIONS:   MAC sedation, administered by CRNA, propofol (Diprivan) 100 mg IV TOPICAL ANESTHETIC:  none  DESCRIPTION OF PROCEDURE:   After the risks benefits and alternatives of the procedure were thoroughly explained, informed consent was obtained.  The LB [id number] endoscope was introduced through the mouth and advanced to the second portion of the duodenum, without limitations.  The instrument was slowly withdrawn as the mucosa was fully examined. <<PROCEDUREIMAGES>>  The upper, middle, and distal third of the esophagus were carefully inspected and no abnormalities were noted. The z-line was well seen at the GEJ. The endoscope was pushed into the fundus which was normal including a retroflexed view. The antrum,gastric body, first and second part of the duodenum were unremarkable. Retroflexed views revealed no abnormalities.    The scope was then withdrawn from the patient and the procedure completed.  COMPLICATIONS:  None  ENDOSCOPIC IMPRESSION: 1) Normal EGD 2) GERD  RECOMMENDATIONS: 1) Anti-reflux regimen to be followed  ______________________________ Wilhemina Bonito. Eda Keys, MD  CC:  Reynaldo Minium, MD;  Andreas Blower, MD;  The Patient  n. eSIGNED:   Wilhemina Bonito. Eda Keys at 06/01/2011 12:12 PM  [name], Ahmira, [id number]

## 2011-06-01 NOTE — Patient Instructions (Addendum)
Handouts given on polyps, diverticulosis, and high fiber diet,gerd  Discharge instructions on blue and green sheets  We will mail you a letter in 1-2 weeks with the pathology results and Dr Lamar Sprinkles recommendations.  Reflujo gastroesofgico - Adultos  (Gastroesophageal Reflux Disease, Adult)  El reflujo gastroesofgico ocurre cuando el cido del estmago pasa al esfago. Cuando el cido entra en contacto con el esfago, el cido provoca dolor (inflamacin) en el esfago. Con el tiempo, pueden formarse pequeos agujeros (lceras) en el revestimiento del esfago. CAUSAS   Exceso de Runner, broadcasting/film/video. Esto aplica presin [employer], lo que hace que el cido del estmago suba hacia el esfago.   El hbito de fumar Aumenta la produccin de cido en el [place].   El consumo de alcohol. Provoca disminucin de la presin en el esfnter esofgico inferior (vlvula o anillo de msculo entre el esfago y Investment banker, corporate), permitiendo que el cido del estmago suba hacia el esfago.   Cenas a ltima hora del da y estmago lleno. Aumenta la presin y la produccin de cido en el estmago.   Malformacin en el esfnter esofgico inferior.  A menudo no se halla causa.  SNTOMAS   Ardor y Radiographer, therapeutic parte inferior del pecho detrs del esternn y en la zona media del [place]. Puede ocurrir [employer] por semana o ms a menudo.   Dificultad para tragar.   Dolor de Advertising copywriter.   Tos seca.   Sntomas similares al asma que incluyen sensacin de opresin en el pecho, falta de aire y sibilancias.  DIAGNSTICO  El mdico diagnosticar el problema basndose en los sntomas. En algunos casos, se indican radiografas y otras pruebas para verificar si hay complicaciones o para comprobar el estado del [address] y Training and development officer.  TRATAMIENTO  El mdico le indicar medicamentos de venta libre o recetados para ayudar a disminuir la produccin de cido. Consulte con su mdico antes de Corporate investment banker o agregar cualquier  medicamento nuevo.  INSTRUCCIONES PARA EL CUIDADO EN EL HOGAR   Modifique los factores que pueda cambiar. Consulte con su mdico para solicitar orientacin relacionada con la prdida de peso, dejar de fumar y el consumo de alcohol.   Evite las comidas y bebidas que [address] [place], [place]:   [place] con cafena o alcohlicas.   Chocolate.   Sabores a Advertising account planner.   Ajo y cebolla.   Comidas muy condimentadas.   Ctricos como naranjas, limones o limas.   Alimentos que contengan tomate, como salsas, [place] y pizza.   Alimentos fritos y Lexicographer.   Evite acostarse durante 3 horas antes de irse a dormir o antes de tomar una siesta.   Haga comidas pequeas durante Glass blower/designer de 3 comidas abundantes.   Use ropas sueltas. No use nada apretado alrededor de la cintura que cause presin en el estmago.   Levante (eleve) la cabecera de la cama 6 a 8 pulgadas (15 a 20 cm) con bloques de madera. Usar almohadas extra no ayuda.   Solo tome medicamentos que se pueden comprar sin receta o recetados para el dolor, Dentist o fiebre, como le indica el mdico.   No tome aspirina, ibuprofeno ni antiinflamatorios no esteroides.  SOLICITE ATENCIN MDICA DE [employer] SI:   [employer], el cuello, la [place], los dientes o la espalda.   El dolor aumenta o cambia la intensidad o la durancin.   Tiene nuseas, vmitos o sudoracin(diaforesis).   Siente falta de aire o dolor en el [place],  o se desmaya.   Vomita y el vmito tiene [place], es de color [place], [place], negro o es similar a la borra del caf o tiene [place].   Las heces son rojas, sanguinolentas o negras.  Estos sntomas pueden ser signos de [address], como enfermedades cardacas, hemorragias gstrias o sangrado esofgico.  ASEGRESE DE QUE:   Comprende estas instrucciones.   Controlar su enfermedad.   Solicitar ayuda de inmediato si no mejora o si empeora.  Document Released: 03/10/2005 Document  Revised: 02/10/2011 [facility] Patient Information 2012 [place], [place].Dieta rica en fibra (High Fiber Diet) Una dieta rica en fibra incluye en la dieta normal mayor cantidad de cereales integrales, legumbres, frutas y vegetales. Los cambios en la dieta incluyen reemplazar carbohidratos refinados por alimentos no refinados. El nivel calrico de la dieta se mantiene esencialmente inalterado. Los americanos consumen aproximadamente 12.1 a 13.5 gramos de fibra dietaria por [employer]. El consumo dietario de referencia (cantidad recomendada) para hombres adultos es de 38 gramos por da y 25 gramos por da para mujeres. Las mujeres embarazadas y en etapa de amamantamiento consumen 28 gramos de fibra dietaria por Futures trader. La fibra es la parte intacta de un vegetal que no se descompone durante la digestin. La fibra funcional es la fibra aislada del vegetal para proporcionar un efecto benfico en el cuerpo. UNA DIETA RICA EN FIBRA SIRVE PARA  Aumentar el volumen de la materia fecal.   Aliviar y regular el movimiento intestinal.   Disminuir el colesterol.  INDICADORES DE QUE NECESITA MS FIBRA  Constipacin y hemorroides.   Diverticulosis no complicada (enfermedad del intestino) y sndrome del colon irritable.   Control de peso.   Como medida de proteccin contra la aterosclerosis (endurecimiento de las arterias), diabetes y Database administrator.  NOTA DE ADVERTENCIA Si padece algn problema digestivo o intestinal, consulte con el profesional que lo asiste antes de agregar alimentos ricos en fibra a su dieta. Algunos de los siguientes problemas mdicos requieren que consulte con el profesional que lo asiste antes de Education officer, environmental una dieta rica en [place].  Diverticulitis aguda (infeccin del intestino).   Pequeas obstrucciones parciales del intestino.   Enfermedad diverticular complicada que implica hemorragia, ruptura (perforacin) o absceso (fornculo).   Presencia de neuropata autonmica (dao al nervio) o paresia  gstrica (el estmago no puede vaciarse solo).  AUMENTE LA FIBRA EN SU DIETA  Comience a agregar fibra a la dieta de manera gradual. Lo mejor es un aumento gradual de alrededor de 5 gramos adicionales diarios (equivalentes a 2 rebanadas de pan integral, 2 porciones de frutas o vegetales, o 1 tazn de cereal rico en fibra). Un aumento muy brusco de consumo de Advertising copywriter en constipacin, flatulencias e hinchazn.   Beba gran cantidad de lquido para mantener la orina de tono claro o color amarillo plido. Se recomiendan agua, jugos o bebidas descafeinadas. Un consumo inadecuado de lquido puede provocarle constipacin.   Consuma una variedad de alimentos ricos en fibra en lugar de un slo tipo de fibra.   Trate de aumentar su ingesta de fibra mediante el consumo de alimentos ricos en fibra en lugar de tomar pldoras de fibra o suplementos que contienen pequeas cantidades de [place].   La meta es reemplazar los tipos de alimentos que se consumen, no suplementar su dieta actual con alimentos ricos en fibra.  INCLUYA UNA VARIEDAD DE FUENTES DE FIBRA   Reemplace cereales refinados y procesados por cereales integrales, frutas enlatadas por frutas frescas, e incorpore otras fuentes de fibra. El arroz  blanco, el pan blanco, y la mayor parte de los alimentos horneados contienen poca o nada de [place].   El arroz integral, el trigo negro, muchas frutas y vegetales son buenas fuentes de [place]. Estas incluyen: el brcoli, el repollo de [place], el repollo, la coliflor, el betabel, la batata, la patata (con piel), la zanahoria, el tomate, la berenjena, la calabaza, las bayas, las frutas frescas y las frutas secas.   Los cereales son la fuente ms rica de [place]. La fibra de cereal se encuentra en los cereales integrales y en el salvado. El salvado es la capa externa rica en fibra del cereal, que se elimina en gran parte al refinarse. En los cereales integrales, el salvado se mantiene. En los cereales que  se consumen normalmente en el desayuno, la mayor cantidad de [place] se encuentra en aqullos cuyo nombre incluye la palabra "bran" (salvado). El contenido de [place] a menudo se Acupuncturist.   Puede ser necesario que incluya frutas y verduras adicionales diariamente.   Al hornear, por cada taza de harina blanca, puede [employer] siguientes sustitutos:   1 taza menos 2 cucharas de [place] integral    taza de harina Blanca ms  taza de harina integral.  Document Released: 05/31/2005 Document Revised: 02/10/2011 [facility] Patient Information 2012 [place], [place].Diverticulosis (Diverticulosis)  La diverticulosis es una enfermedad frecuente que aparece cuando se forman pequeas bolsas (divertculos) en las paredes del colon. El riesgo aumenta con la edad. Ocurre con ms frecuencia en aquellas personas que se alimentan con una dieta pobre en fibras. La Harley[employer]son de los individuos que sufren este trastorno no tiene sntomas. Las personas que tienen sntomas generalmente experimentan dolor abdominal, constipacin o heces flojas (diarrea). INSTRUCCIONES PARA EL CUIDADO DOMICILIARIO  Aumento en la cantidad de fibra en la dieta, segn las indicaciones del mdico o nutricionista. Esto puede reducir los sntomas de diverticulosis.   El mdico podr indicarle que tome un suplemento dietario con fibras.   Beba entre 6 y 8 vasos de agua por da para Associate Professor.   Trate de no hacer fuerza al mover el intestino.   El mdico podr recomendarle que no comer nueces y semillas para evitar complicaciones, aunque an no se conoce si esto produce algn beneficio.   Solo tome medicamentos que se pueden comprar sin receta o recetados para Chief Technology Officer, Dentist o fiebre, como le indica el mdico.  LOS ALIMENTOS CON ALTO CONTENIDO DE FIBRA SON:  Frutas: Luna Kitchens, durazno, pera, mandarina, pasas de uva, ciruelas.   Vegetales: Repollitos de [place], esprragos, brcoli, calabaza, zanahoria, coliflor,  lechuga romana, espinaca, tomates, zapallo, zucchini.   Vegetales que contienen hidratos de carbono: Porotos, frijoles, habas, arvejas partidas, lentejas, patatas (con piel).   Granos: Pan de salvado, arroz marrn, copos de cereal, harina de avena, arroz blanco, bollitos de salvado.  SOLICITE ATENCIN MDICA INMEDIATAMENTE SI:  El dolor o la hinchazn [place].   La temperatura oral le sube a ms de 102 F (38.9 C) y no puede bajarla con medicamentos.   Los vmitos o la materia fecal contienen sangre o son negros.  Document Released: 05/13/2008 Document Revised: 02/10/2011 [facility] Patient Information 2012 [place], [place].

## 2011-06-01 NOTE — Progress Notes (Signed)
Patient did not experience any of the following events: a burn prior to discharge; a fall within the facility; wrong site/side/patient/procedure/implant event; or a hospital transfer or hospital admission upon discharge from the facility. (G8907) Patient did not have preoperative order for IV antibiotic SSI prophylaxis. (G8918)  

## 2011-06-01 NOTE — Op Note (Signed)
[facility] [address] [place], [place]  [address]  COLONOSCOPY PROCEDURE REPORT  PATIENT:  [name], [name]  MR#:  [id number] BIRTHDATE:  02-Dec-1965, 45 yrs. old  GENDER:  female ENDOSCOPIST:  Wilhemina Bonito. Eda Keys, MD REF. BY:  Reynaldo Minium, M.D. PROCEDURE DATE:  06/01/2011 PROCEDURE:  Colonoscopy with snare polypectomy x 1 ASA CLASS:  Class II INDICATIONS:  heme positive stool, family history of colon cancer ; half sibling - 21s; prior exam 06-2008 negative MEDICATIONS:   MAC sedation, administered by CRNA, propofol (Diprivan) 300 mg IV  DESCRIPTION OF PROCEDURE:   After the risks benefits and alternatives of the procedure were thoroughly explained, informed consent was obtained.  Digital rectal exam was performed and revealed no abnormalities.   The LB 180AL [id number] endoscope was introduced through the anus and advanced to the cecum, which was identified by both the appendix and ileocecal valve, without limitations.  The quality of the prep was excellent, using MoviPrep.  The instrument was then slowly withdrawn as the colon was fully examined. <<PROCEDUREIMAGES>>  FINDINGS:  The terminal ileum appeared normal.  A diminutive polyp was found at the hepatic flexure and snared without cautery. Retrieval was successful. Mild diverticulosis was found in the sigmoid colon.  Otherwise normal colonoscopy without other polyps, masses, vascular ectasias, or inflammatory changes.   Retroflexed views in the rectum revealed no abnormalities.    The time to cecum = 4:10  minutes. The scope was then withdrawn in 12:56 minutes from the cecum and the procedure completed.  COMPLICATIONS:  None  ENDOSCOPIC IMPRESSION: 1) Normal terminal ileum 2) Diminutive polyp at the hepatic flexure - removed 3) Mild diverticulosis in the sigmoid colon 4) Otherwise normal colonoscopy  RECOMMENDATIONS: 1) Follow up colonoscopy in 5 years (with Dr  Arlyce Dice)  ______________________________ Wilhemina Bonito. Eda Keys, MD  CC:  Reynaldo Minium, MD;  Andreas Blower, MD;  The Patient  n. eSIGNED:   Wilhemina Bonito. Eda Keys at 06/01/2011 12:09 PM  [name], [name], [id number]

## 2011-06-02 ENCOUNTER — Telehealth: Payer: Self-pay

## 2011-06-02 NOTE — Telephone Encounter (Signed)

## 2011-09-29 ENCOUNTER — Ambulatory Visit (INDEPENDENT_AMBULATORY_CARE_PROVIDER_SITE_OTHER): Payer: 59 | Admitting: Gastroenterology

## 2011-09-29 ENCOUNTER — Encounter: Payer: Self-pay | Admitting: Gastroenterology

## 2011-09-29 DIAGNOSIS — Z86010 Personal history of colonic polyps: Secondary | ICD-10-CM

## 2011-09-29 DIAGNOSIS — R110 Nausea: Secondary | ICD-10-CM

## 2011-09-29 DIAGNOSIS — K5909 Other constipation: Secondary | ICD-10-CM | POA: Insufficient documentation

## 2011-09-29 DIAGNOSIS — K5900 Constipation, unspecified: Secondary | ICD-10-CM

## 2011-09-29 DIAGNOSIS — Z800 Family history of malignant neoplasm of digestive organs: Secondary | ICD-10-CM

## 2011-09-29 NOTE — Patient Instructions (Addendum)
Hold tramadol for 4-5 days. If nausea is improved do not resume. If nausea is unchanged it is okay to resume. We are giving you samples of Linzess today Follow up in 4 weeks

## 2011-09-29 NOTE — Assessment & Plan Note (Addendum)
She has persistent constipation which likely is exacerbated by chronic narcotic use. Colonoscopy was negative except for a diminutive polyp.  Recommendations #1 trial of Linzess 145 mcg daily. If this is not successful I will place her on lactulose

## 2011-09-29 NOTE — Assessment & Plan Note (Signed)
Colonoscopy every 5 years 

## 2011-09-29 NOTE — Progress Notes (Signed)
History of Present Illness:  [name] has returned for followup of nausea and constipation. She underwent upper and lower endoscopy. The former was negative. On the latter a small adenomatous polyp was removed. Constipation remains a problem. She may go every 3 days but has a very small, incomplete bowel movement. She complains of chronic nausea and abdominal distention with discomfort. She is taken [place] in the past with only transient improvement.    Review of Systems: Pertinent positive and negative review of systems were noted in the above HPI section. All other review of systems were otherwise negative.    Current Medications, Allergies, Past Medical History, Past Surgical History, Family History and Social History were reviewed in [employer] electronic medical record  Vital signs were reviewed in today's medical record. Physical Exam: General: Well developed , well nourished, no acute distress

## 2011-09-29 NOTE — Assessment & Plan Note (Signed)
Persistent nausea may be related to medications including tramadol and fentanyl patch.  I doubt it is secondary to constipation.  Recommendations #1 hold tramadol for 4-5 days.

## 2011-09-29 NOTE — Assessment & Plan Note (Signed)
-   Follow-up colonoscopy 5 years

## 2011-11-22 ENCOUNTER — Ambulatory Visit: Payer: 59 | Admitting: Gastroenterology

## 2012-04-25 ENCOUNTER — Encounter: Payer: Self-pay | Admitting: Gynecology

## 2012-05-09 ENCOUNTER — Encounter: Payer: Self-pay | Admitting: Gynecology

## 2012-05-09 ENCOUNTER — Ambulatory Visit (INDEPENDENT_AMBULATORY_CARE_PROVIDER_SITE_OTHER): Payer: 59 | Admitting: Gynecology

## 2012-05-09 VITALS — BP 126/78 | Ht 59.75 in | Wt 166.0 lb

## 2012-05-09 DIAGNOSIS — Z01419 Encounter for gynecological examination (general) (routine) without abnormal findings: Secondary | ICD-10-CM

## 2012-05-09 NOTE — Patient Instructions (Addendum)
Vacuna contra la difteria, el ttanos, y la tos ferina Lo que usted necesita saber (Diphtheria, Tetanus, and Pertussis [DTaP] Vaccine) POR QU VACUNARSE? La difteria, el ttano y la tos [name] son enfermedades graves provocadas por bacterias. La difteria y la tos [name] se [place] de persona a Social worker. El ttano ingresa al cuerpo a travs de cortes o heridas. La difteria produce un recubrimiento denso en la parte trasera de la garganta.  Puede producir problemas para respirar, parlisis, insuficiencia cardaca, e incluso la muerte. El ttanos causa contracturas dolorosas de los msculos, a menudo en todo el cuerpo.  Puede ocasionar un "bloqueo" de la [place], de modo que es imposible abrir la boca o tragar. El ttanos produce la muerte en alrededor de 2 cada 10 casos. La tos ferina produce ataques de tos tan fuertes que, en nios, imposibilita comer, beber o respirar. Estos ataques pueden durar semanas.  Puede producir neumona, convulsiones (ataques de espasmos o ausencias), dao cerebral, y la muerte. La vacuna para la difteria, el ttano y la tos [place] (DTPa) puede ayudar a Market researcher. La mayor parte de los nios que la reciben estarn protegidos durante toda su niez. Muchos ms nios padeceran estas enfermedades si no fueran vacunados. La DTPa es una versin ms segura de una vacuna anterior denominada DTP. La DTP se ha dejado de [employer]. QUIN DEBE RECIBIR ESTA VACUNA Y CUNDO? Los nios deberan recibir 5 dosis de la vacuna DTPa, una dosis en cada una de las siguientes edades:  2 meses.  4 meses.  6 meses.  15 a 18 meses.  4 a 6 aos. La vacuna DTPa puede darse en simultneo con otras vacunas. ALGUNOS NIOS NO DEBERAN DARSE LA VACUNA DTPA O DEBERAN ESPERAR  Aquellos nios con trastornos menores, tales como resfros, pueden ser vacunados, pero aquellos con trastornos moderados a graves deberan esperar hasta su recuperacin para  recibir la vacuna DTPa.  Cualquier nio que haya tenido una reaccin alrgica grave luego de una dosis de DTPa no debera recibir otra dosis.  Cualquier nio que haya sufrido una enfermedad cerebral o del sistema nervioso luego de [address]  [address] de haber recibido una dosis de la vacuna DTPa, no debera recibir otra dosis.  Hable con el mdico si el nio:  Ha tenido convulsiones o sufri un colapso luego de una dosis de DTPa.  Ha llorado sin parar durante 3 horas o ms luego de una dosis de DTPa.  Ha tenido fiebre mayor a 105 F (40.6 C) luego de una dosis de DTPa.  Pida ms informacin al profesional que lo asiste. Algunos de estos nios podrn recibir una vacuna que no protege para la tos [place], [place] DT. NIOS DE MAYOR EDAD Y ADULTOS  La vacuna DTPa no se administra en adolescentes, adultos, o nios mayores a los 7 aos de [place].  Sin embargo, [name], art an requieren proteccin. Existe una vacuna llamada Tdap, que es similar a la DTPa. Se recomienda una dosis nica de Tdap en personas desde los 11 a los 64 aos de [place]. Otra vacuna, llamada Td, provee proteccin contra el ttanos y la difteria, pero no contra la tos [place]. Se recomienda su aplicacin cada 10 aos. CULES SON LOS RIESGOS DE LA VACUNA DTPA?  Enfermarse de difteria, ttanos o pertusis es mucho ms peligroso que recibir la vacuna DTPa.  Sin embargo, una E[place], como cualquier otro medicamento, puede causar problemas serios, como [name], art graves. El riesgo de que la vacuna DTPa cause daos  graves o la muerte es extremadamente pequeo. Problemas leves (comunes)  Fiebre (en hasta 1 de cada 4 nios).  Enrojecimiento o inflamacin en el lugar en el que se dio la inyeccin (en hasta 1 de cada 4 nios).  Dolor o sensibilidad en Immunologist en el que se dio la inyeccin (en hasta 1 de cada 4 nios). Estos problemas ocurren ms a menudo luego de la cuarta y [place] dosis de vacuna DTPa que en las dosis anteriores. En  ocasiones luego de la cuarta o quinta dosis se observa la inflamacin de la pierna o brazo completo en que se ha dado la inyeccin, y puede durar de 1 a 7 das (en hasta 1 nio de cada 30). Otros problemas leves incluyen:  Irritabilidad (en hasta 1 de cada 3 nios).  Cansancio o falta de apetito (en hasta 1 de cada 10 nios).  Vmitos (en hasta 1 de cada 50 nios). Estos problemas ocurren generalmente de 1 a 3 das luego de la inyeccin. Problemas moderados (poco frecuentes)  Convulsiones (sacudones o fijacin de la mirada) (en hasta 1 de cada 14.000 nios).  Llanto sin parar durante 3 horas o ms (en hasta 1 nio de cada 1.000).  Fiebre alta, mayor a 105 F (40.6 C) (alrededor de 1 nio cada 16.000). Problemas graves (muy raros)  Automotive engineer grave (menos de 1 por milln de dosis).  Se han informado varios otros problemas graves luego de la aplicacin de la vacuna DTPa. Estos incluyen:  Convulsiones a largo plazo, coma, o reduccin de la conciencia.  Dao permanente al cerebro. Estos son casos tan poco frecuentes que resulta difcil saber si fueron provocados por la vacuna. Controlar la fiebre es particularmente importante para los nios que han tenido convulsiones, por cualquier motivo. Tambin es importante si otro miembro de la familia ha tenido convulsiones. Puede reducir la fiebre y el dolor dando al nio un analgsico sin aspirina al recibir la vacuna, y durante las siguientes 24 horas, segn las instrucciones del [place]. QU PASA SI HAY UNA REACCIN MODERADA O GRAVE? A qu debo prestar atencin? Cualquier cosa extraa o poco comn, como una reaccin alrgica, fiebre alta o comportamiento extrao. Es muy poco comn que ocurran reacciones alrgicas graves con cualquier vacuna. Si se produjera una, sera dentro de los primeros minutos hasta algunas horas luego de la inyeccin. Podr observar dificultad para respirar, ronquera o silbidos al respirar, ronchas, palidez,  debilidad, frecuencia cardaca elevada, o mareos. Si ocurrieran fiebre o convulsiones, normalmente sera dentro de la primera semana luego de la inyeccin. Qu debo hacer?  Comunquese con el mdico o lleve inmediatamente a la persona a un mdico.  Diga al mdico lo que ocurri, la fecha y hora en que ocurri, y cundo recibi la vacuna.  Pida al mdico, enfermera, o al servicio de salud que complete el informe [employer] efectos adversos de la vacuna (Vaccine Adverse Event Reporting System, VAERS). O, bien puede completar el informe a travs del sitio web de VAERS en www.vaers.LAgents.no o llamando al [phone]. VAERS no proporciona consejos mdicos. EL PROGRAMA NACIONAL DE COMPENSACIN POR LESIONES CAUSADAS POR VACUNAS (NATIONAL VACCINE INJURY COMPENSATION PROGRAM)  En el raro caso en que usted o su hijo hayan tenido una reaccin grave a [name], se ha creado un programa federal para ayudarlo a Network engineer atencin de los lesionados.  Para obtener detalles acerca del [name] de Compensacin por Lesiones Causadas por [place], llame al [phone] o visite el sitio web del programa en [email or web address]  CMO OBTENER MS INFORMACIN?  Consulte con el profesional que lo asiste. Podr darle el prospecto de la vacuna o sugerirle otras fuentes de informacin.  Llame al programa de vacunacin del departamento de salud local o estatal.  Comunquese con los Centers for [employer] and Prevention (Centros para el control y la prevencin de enfermedades, CDC).  Llame al [phone] (1-800-CDC-INFO).  Visite el sitio web del [employer] de [employer], en [email or web address].uy CDC Diphtheria, Tetanus, and Pertussis-Spanish VIS (10/28/05) Document Released: 08/27/2008 Document Revised: 08/23/2011 [facility] Patient Information 2013 [place], [place].   Ejercicios para perder peso (Exercise to Lose Weight) La actividad fsica y [name] dieta saludable ayudan a  perder peso. El mdico podr sugerirle ejercicios especficos. IDEAS Y CONSEJOS PARA HACER EJERCICIOS  Elija opciones econmicas que disfrute hacer , como caminar, andar en bicicleta o los vdeos para ejercitarse.   Utilice las Microbiologist del ascensor.   Camine durante la hora del almuerzo.   Estacione el auto lejos del lugar de [place] o [place].   Concurra a un gimnasio o tome clases de gimnasia.   Comience con 5  10 minutos de actividad fsica por da. Ejercite hasta 30 minutos, 4 a 6 das por [address]   Utilice zapatos que tengan un buen soporte y ropas cmodas.   Elongue antes y despus de Company secretary.   Ejercite hasta que aumente la respiracin y el corazn palpite rpido.   Beba agua extra cuando ejercite.   No haga ejercicio Firefighter, sentirse mareado o que le falte mucho el aire.  La actividad fsica puede quemar alrededor de 150 caloras.  Correr 20 cuadras en 15 minutos.   Jugar vley durante 45 a 60 minutos.   Limpiar y encerar el auto durante 45 a 60 minutos.   Jugar ftbol americano de toque.   Caminar 25 cuadras en 35 minutos.   Empujar un cochecito 20 cuadras en 30 minutos.   Jugar baloncesto durante 30 minutos.   Rastrillar hojas secas durante 30 minutos.   Andar en bicicleta 80 cuadras en 30 minutos.   Caminar 30 cuadras en 30 minutos.   Bailar durante 30 minutos.   Quitar la nieve con una pala durante 15 minutos.   Nadar vigorosamente durante 20 minutos.   Subir escaleras durante 15 minutos.   Andar en bicicleta 60 cuadras durante 15 minutos.   Arreglar el jardn entre 30 y 45 minutos.   Saltar a la soga durante 15 minutos.   Limpiar vidrios o pisos durante 45 a 60 minutos.  Document Released: 09/04/2010 Document Revised: 02/10/2011 [facility] Patient Information 2012 [place], [place].                                                   Control del colesterol  Los niveles de colesterol en el organismo estn determinados  significativamente por su dieta. Los niveles de colesterol tambin se relacionan con la enfermedad cardaca. El material que sigue ayuda a Software engineer relacin y a Chiropractor qu puede hacer para mantener su corazn sano. No todo el colesterol es [place]. Las lipoprotenas de baja densidad (LDL) forman el colesterol "malo". El colesterol malo puede ocasionar depsitos de grasa que se acumulan en el interior de las arterias. Las lipoprotenas de alta densidad (HDL) es el colesterol "bueno". Ayuda a remover el colesterol LDL "malo" de la [place]. El colesterol es  un factor de riesgo muy importante para la enfermedad cardaca. Otros factores de riesgo son la hipertensin arterial, el hbito de fumar, el estrs, la herencia y [place].  El msculo cardaco obtiene el suministro de sangre a travs de las arterias coronarias. Si su colesterol LDL ("malo") est elevado y el HDL ("bueno") es bajo, tiene un factor de riesgo para que se formen depsitos de Holiday representative en las arterias coronarias (los vasos sanguneos que suministran sangre al corazn). Esto hace que haya menos lugar para que la sangre circule. Sin la suficiente sangre y oxgeno, el msculo cardaco no puede funcionar correctamente, y usted podr sentir dolores en el pecho (angina pectoris). Cuando una arteria coronaria se cierra completamente, una parte del msculo cardaco puede morir (infarto de miocardio). CONTROL DEL COLESTEROL Cuando el profesional que lo asiste enva la sangre al laboratorio para Artist nivel de colesterol, puede realizarle tambin un perfil completo de los lpidos. Con esta prueba, se puede determinar la cantidad total de colesterol, as como los niveles de LDL y HDL. Los triglicridos son un tipo de grasa que circula en la sangre y que tambin puede utilizarse para determinar el riesgo de enfermedad cardaca. En la siguiente tabla se establecen los nmeros ideales: Prueba: Colesterol total  Menos de 200 mg/dl.  Prueba: LDL "colesterol  malo"  Menos de 100 mg/dl.   Menos de 70 mg/dl si tiene riesgo muy elevado de sufrir un ataque cardaco o muerte cardaca sbita.  Prueba: HDL "colesterol bueno"  Mujeres: Ms de 50 mg/dl.   Hombres: Ms de 40 mg/dl.  Prueba: Trigliceridos  Menos de 150 mg/dl.  CONTROL DEL COLESTEROL CON DIETA Aunque factores como el ejercicio y el estilo de vida son importantes, la "primera lnea de ataque" es la dieta. Esto se debe a que se sabe que ciertos alimentos hacen subir el colesterol y otros lo [place]. El objetivo debe ser [employer] alimentos, de modo que tengan un efecto sobre el colesterol y, an ms importante, Microbiologist las grasas saturadas y trans con otros tipos de grasas, como las monoinsaturadas y las poliinsaturadas y cidos grasos omega-3 . En promedio, una persona no debe consumir ms de 15 a 17 g de grasas saturadas por [employer]. Las grasas saturadas y trans se consideran grasas "malas", ya que elevan el colesterol LDL. Las grasas saturadas se encuentran principalmente en productos animales como carne, [place] y crema. Pero esto no significa que usted Marketing executive todas sus comidas favoritas. Actualmente, como lo muestra el cuadro que figura al final de este documento, hay sustitutos de buen sabor, bajos en grasas y en colesterol, para la mayora de los alimentos que a usted Musician. Elija aquellos alimentos alternativos que sean bajos en grasas o sin grasas. Elija cortes de carne del cuarto trasero o lomo ya que estos cortes son los que tienen menor cantidad de grasa y Oncologist. El pollo (sin piel), el pescado, la carne de ternera, y la [place] de [place] molida son excelentes opciones. Elimine las carnes [employer] o el salami. Los [employer] o nada de grasas saturadas. Cuando consuma carne [place], carne de aves de corral, o pescado, hgalo en porciones de 85 gramos (3 onzas). Las grasas trans tambin se llaman "aceites parcialmente hidrogenados". Son aceites  manipulados cientficamente de [place] que son slidos a Publishing rights manager, tienen una larga vida y Glass blower/designer sabor y la textura de los alimentos a los que se Scientist, clinical (histocompatibility and immunogenetics). Las grasas trans se encuentran en la [place],  masitas, crackers y alimentos horneados.  Para hornear y cocinar, el aceite es un excelente sustituto para la [place]. Los aceites monoinsaturados tienen un beneficio particular, ya que se cree que disminuyen el colesterol LDL (colesterol malo) y elevan el HDL. Deber evitar los aceites tropicales saturados como el de coco y el de [place].  Recuerde, adems, que puede comer sin restricciones los grupos de alimentos que son naturalmente libres de grasas saturadas y Neurosurgeon trans, entre los que se incluyen el pescado, las frutas (excepto el [place]), verduras, frijoles, cereales (cebada, arroz, [place], trigo) y las pastas (sin salsas con crema)  IDENTIFIQUE LOS ALIMENTOS QUE DISMINUYEN EL COLESTEROL  Pueden disminuir el colesterol las fibras solubles que estn en las frutas, como las [place], en los vegetales como el brcoli, las patatas y las zanahorias; en las legumbres como frijoles, guisantes y [name], occupational; y en los cereales como la cebada. Los alimentos fortificados con fitosteroles tambin Engineer, production. Debe consumir al menos 2 g de estos alimentos a diario para Financial planner de disminucin de [place].  En el supermercado, lea las etiquetas de los envases para identificar los alimentos bajos en grasas saturadas, libres de grasas trans y bajos en [place], . Elija quesos que tengan solo de 2 a 3 g de grasa saturada por onza (28,35 g). Use una margarina que no dae el corazn, [place] de grasas trans o aceite parcialmente hidrogenado. Al comprar alimentos horneados (galletitas dulces y [place]) evite el aceite parcialmente hidrogenado. Los panes y bollos debern ser de granos enteros (harina de maz o de avena entera, en lugar de "harina" o "harina enriquecida"). Compre  sopas en lata que no sean cremosas, con bajo contenido de sal y sin grasas adicionadas.  TCNICAS DE PREPARACIN DE LOS ALIMENTOS  Nunca fra los alimentos en aceite abundante. Si debe frer, hgalo en poco aceite y removiendo [place], porque as se utilizan muy pocas grasas, o utilice un spray antiadherente. Cuando le sea posible, hierva, hornee o ase las carnes y cocine los vegetales al vapor. En vez de [employer] con mantequilla o [place], utilice limn y hierbas, pur de Psychologist, educational y canela (para las calabazas y batatas), yogurt y salsa descremados y aderezos para ensaladas bajos en contenido graso.  BAJO EN GRASAS SATURADAS / SUSTITUTOS BAJOS EN GRASA  Carnes / Grasas saturadas (g)  Evite: Bife, corte graso (3 oz/85 g) / 11 g   Elija: Bife, corte magro (3 oz/85 g) / 4 g   Evite: Hamburguesa (3 oz/85 g) / 7 g   Elija:  [place] magra (3 oz/85 g) / 5 g   Evite: Jamn (3 oz/85 g) / 6 g   Elija:  Jamn magro (3 oz/85 g) / 2.4 g   Evite: Pollo, con piel (3 oz/85 g), Carne oscura / 4 g   Elija:  Pollo, sin piel (3 oz/85 g), Carne oscura / 2 g   Evite: Pollo, con piel (3 oz/85 g), Carne magra / 2.5 g   Elija: Pollo, sin piel (3 oz/85 g), Carne magra / 1 g  Lcteos / Grasas saturadas (g)  Evite: Leche entera (1 taza) / 5 g   Elija: Leche con bajo contenido de grasa, 2% (1 taza) / 3 g   Elija: Leche con bajo contenido de grasa, 1% (1 taza) / 1.5 g   Elija: Leche descremada (1 taza) / 0.3 g   Evite: Queso duro (1 oz/28 g) / 6 g   Elija: Queso descremado (1 oz/28 g) /  2-3 g   Evite: [employer], 4% grasa (1 taza)/ 6.5 g   Elija: Queso cottage con bajo contenido de grasa, 1% grasa (1 taza)/ 1.5 g   Evite: Helado (1 taza) / 9 g   Elija: Sorbete (1 taza) / 2.5 g   Elija: Yogurt helado sin contenido de grasa (1 taza) / 0.3 g   Elija: Barras de fruta congeladas / vestigios   Evite: Crema batida (1 cucharada) / 3.5 g   Elija: Batidos glac sin lcteos  (1 cucharada) / 1 g  Condimentos / Grasas saturadas (g)  Evite: Mayonesa (1 cucharada) / 2 g   Elija: Mayonesa con bajo contenido de grasa (1 cucharada) / 1 g   Evite: [place] (1 cucharada) / 7 g   Elija: Margarina extra light (1 cucharada) / 1 g   Evite: Aceite de coco (1 cucharada) / 11.8 g   Elija: Aceite de oliva (1 cucharada) / 1.8 g   Elija: Aceite de maz (1 cucharada) / 1.7 g   Elija: Aceite de crtamo (1 cucharada) / 1.2 g   Elija: Aceite de girasol (1 cucharada) / 1.4 g   Elija: Aceite de soja (1 cucharada) / 2.4 g   Elija: Aceite de canola (1 cucharada) / 1 g  Document Released: 05/31/2005 Document Revised: 02/10/2011 [facility] Patient Information 2012 [place], [place].

## 2012-05-09 NOTE — Progress Notes (Signed)
[name] 1965/10/06 [id number]   History:    46 y.o.  for annual gyn exam with no complaints today. Her primary physician is Dr.Yuri Luiz Iron who has been treating her for hypercholesterolemia. Patient has blood work schedule and to see her next week. Review of patient's records indicated that she had a prior history of transvaginal hysterectomy along with posterior colporrhaphy and TVT sling. She had a normal colonoscopy in 2009. Last year at time of her annual exam she had positive heme stool and was referred back to the gastroenterologist who did a colonoscopy and removed a colonic polyp which was a tubular adenoma. She scheduled for followup colonoscopy in 5 years. Her last mammogram was November of this year which was normal. She does her monthly self breast examination. She's taking calcium and vitamin D. Review of her record indicates she was weighing 177 is down to 166. She attributes it to discontinuation off her antidepressant medication.  Past medical history,surgical history, family history and social history were all reviewed and documented in the EPIC chart.  Gynecologic History No LMP recorded. Patient has had a hysterectomy. Contraception: status post hysterectomy Last Pap: 2012. Results were: normal Last mammogram: 2013. Results were: normal  Obstetric History OB History    Grav Para Term Preterm Abortions TAB SAB Ect Mult Living   2 2 2       2      # Outc Date GA Lbr Len/2nd Wgt Sex Del Anes PTL Lv   1 TRM     F SVD  No Yes   2 TRM     M SVD  No Yes       ROS: A ROS was performed and pertinent positives and negatives are included in the history.  GENERAL: No fevers or chills. HEENT: No change in vision, no earache, sore throat or sinus congestion. NECK: No pain or stiffness. CARDIOVASCULAR: No chest pain or pressure. No palpitations. PULMONARY: No shortness of breath, cough or wheeze. GASTROINTESTINAL: No abdominal pain, nausea, vomiting or diarrhea, melena or  bright red blood per rectum. GENITOURINARY: No urinary frequency, urgency, hesitancy or dysuria. MUSCULOSKELETAL: No joint or muscle pain, no back pain, no recent trauma. DERMATOLOGIC: No rash, no itching, no lesions. ENDOCRINE: No polyuria, polydipsia, no heat or cold intolerance. No recent change in weight. HEMATOLOGICAL: No anemia or easy bruising or bleeding. NEUROLOGIC: No headache, seizures, numbness, tingling or weakness. PSYCHIATRIC: No depression, no loss of interest in normal activity or change in sleep pattern.     Exam: chaperone present  BP 126/78  Ht 4' 11.75" (1.518 m)  Wt 166 lb (75.297 kg)  BMI 32.69 kg/m2  Body mass index is 32.69 kg/(m^2).  General appearance : Well developed well nourished female. No acute distress HEENT: Neck supple, trachea midline, no carotid bruits, no thyroidmegaly Lungs: Clear to auscultation, no rhonchi or wheezes, or rib retractions  Heart: Regular rate and rhythm, no murmurs or gallops Breast:Examined in sitting and supine position were symmetrical in appearance, no palpable masses or tenderness,  no skin retraction, no nipple inversion, no nipple discharge, no skin discoloration, no axillary or supraclavicular lymphadenopathy Abdomen: no palpable masses or tenderness, no rebound or guarding Extremities: no edema or skin discoloration or tenderness  Pelvic:  Bartholin, Urethra, Skene Glands: Within normal limits             Vagina: No gross lesions or discharge  Cervix: Absent  Uterus absent Adnexa  Without masses or tenderness  Anus and perineum  normal   Rectovaginal  normal sphincter tone without palpated masses or tenderness             Hemoccult cards provided for her to submit next month one year after colonoscopy and resection of colon polyp.     Assessment/Plan:  46 y.o. female for annual exam who had a colon polyps resected in 2012 (tubular adenoma) scheduled for followup in 4 years from now. We discussed the new Pap smear  screening guidelines. Patient with no prior history of abnormal Pap smears. No Pap smear done today. Patient was encouraged to do her monthly self breast examination. We discussed importance of calcium vitamin D and regular exercise for osteoporosis prevention. Patient had tetanus and diphtheria vaccine 2009.Marland Kitchen    Ok Edwards MD, 4:22 PM 05/09/2012

## 2012-05-15 ENCOUNTER — Encounter: Payer: Self-pay | Admitting: Gynecology

## 2012-05-17 ENCOUNTER — Encounter: Payer: Self-pay | Admitting: Gynecology

## 2012-06-04 DIAGNOSIS — G8921 Chronic pain due to trauma: Secondary | ICD-10-CM | POA: Insufficient documentation

## 2012-06-04 DIAGNOSIS — M5417 Radiculopathy, lumbosacral region: Secondary | ICD-10-CM | POA: Insufficient documentation

## 2013-04-04 ENCOUNTER — Other Ambulatory Visit: Payer: Self-pay | Admitting: *Deleted

## 2013-04-04 DIAGNOSIS — M545 Low back pain, unspecified: Secondary | ICD-10-CM

## 2013-04-04 DIAGNOSIS — M542 Cervicalgia: Secondary | ICD-10-CM

## 2013-04-04 DIAGNOSIS — M5417 Radiculopathy, lumbosacral region: Secondary | ICD-10-CM

## 2013-04-04 DIAGNOSIS — M5412 Radiculopathy, cervical region: Secondary | ICD-10-CM

## 2013-04-12 ENCOUNTER — Ambulatory Visit
Admission: RE | Admit: 2013-04-12 | Discharge: 2013-04-12 | Disposition: A | Discharge status: Home / Self Care | Payer: 59 | Source: Ambulatory Visit | Attending: *Deleted | Admitting: *Deleted

## 2013-04-12 DIAGNOSIS — M545 Low back pain, unspecified: Secondary | ICD-10-CM

## 2013-04-12 DIAGNOSIS — M542 Cervicalgia: Secondary | ICD-10-CM

## 2013-04-12 DIAGNOSIS — M5417 Radiculopathy, lumbosacral region: Secondary | ICD-10-CM

## 2013-04-12 DIAGNOSIS — M5412 Radiculopathy, cervical region: Secondary | ICD-10-CM

## 2013-04-12 MED ORDER — GADOBENATE DIMEGLUMINE 529 MG/ML IV SOLN
15.0000 mL | Freq: Once | INTRAVENOUS | Status: AC | PRN
  Administered 2013-04-12: 15 mL via INTRAVENOUS

## 2013-12-19 ENCOUNTER — Encounter: Payer: Self-pay | Admitting: Gynecology

## 2014-03-06 ENCOUNTER — Encounter: Payer: Self-pay | Admitting: Gastroenterology

## 2014-04-15 ENCOUNTER — Encounter: Payer: Self-pay | Admitting: Gynecology

## 2016-04-16 ENCOUNTER — Encounter: Payer: Self-pay | Admitting: Internal Medicine

## 2016-07-19 DIAGNOSIS — Z9889 Other specified postprocedural states: Secondary | ICD-10-CM | POA: Diagnosis not present

## 2016-07-19 DIAGNOSIS — Z79899 Other long term (current) drug therapy: Secondary | ICD-10-CM | POA: Diagnosis not present

## 2016-07-19 DIAGNOSIS — M542 Cervicalgia: Secondary | ICD-10-CM | POA: Diagnosis not present

## 2016-07-19 DIAGNOSIS — R29898 Other symptoms and signs involving the musculoskeletal system: Secondary | ICD-10-CM | POA: Diagnosis not present

## 2016-07-26 DIAGNOSIS — L811 Chloasma: Secondary | ICD-10-CM | POA: Diagnosis not present

## 2016-07-26 DIAGNOSIS — L83 Acanthosis nigricans: Secondary | ICD-10-CM | POA: Diagnosis not present

## 2016-09-16 DIAGNOSIS — M549 Dorsalgia, unspecified: Secondary | ICD-10-CM | POA: Diagnosis not present

## 2016-09-16 DIAGNOSIS — R29898 Other symptoms and signs involving the musculoskeletal system: Secondary | ICD-10-CM | POA: Diagnosis not present

## 2016-09-16 DIAGNOSIS — Z79899 Other long term (current) drug therapy: Secondary | ICD-10-CM | POA: Diagnosis not present

## 2016-09-16 DIAGNOSIS — M542 Cervicalgia: Secondary | ICD-10-CM | POA: Diagnosis not present

## 2016-10-15 DIAGNOSIS — M542 Cervicalgia: Secondary | ICD-10-CM | POA: Diagnosis not present

## 2016-10-15 DIAGNOSIS — R29898 Other symptoms and signs involving the musculoskeletal system: Secondary | ICD-10-CM | POA: Diagnosis not present

## 2016-10-15 DIAGNOSIS — Z79899 Other long term (current) drug therapy: Secondary | ICD-10-CM | POA: Diagnosis not present

## 2016-10-15 DIAGNOSIS — Z9889 Other specified postprocedural states: Secondary | ICD-10-CM | POA: Diagnosis not present

## 2016-10-23 DIAGNOSIS — B354 Tinea corporis: Secondary | ICD-10-CM | POA: Diagnosis not present

## 2016-10-23 DIAGNOSIS — E7800 Pure hypercholesterolemia, unspecified: Secondary | ICD-10-CM | POA: Diagnosis not present

## 2016-10-27 ENCOUNTER — Encounter: Payer: Self-pay | Admitting: Gynecology

## 2016-11-30 DIAGNOSIS — G894 Chronic pain syndrome: Secondary | ICD-10-CM | POA: Diagnosis not present

## 2016-11-30 DIAGNOSIS — M5442 Lumbago with sciatica, left side: Secondary | ICD-10-CM | POA: Diagnosis not present

## 2016-11-30 DIAGNOSIS — M542 Cervicalgia: Secondary | ICD-10-CM | POA: Diagnosis not present

## 2017-01-11 DIAGNOSIS — M542 Cervicalgia: Secondary | ICD-10-CM | POA: Diagnosis not present

## 2017-01-11 DIAGNOSIS — G894 Chronic pain syndrome: Secondary | ICD-10-CM | POA: Diagnosis not present

## 2017-01-11 DIAGNOSIS — M5442 Lumbago with sciatica, left side: Secondary | ICD-10-CM | POA: Diagnosis not present

## 2017-02-08 DIAGNOSIS — G894 Chronic pain syndrome: Secondary | ICD-10-CM | POA: Diagnosis not present

## 2017-02-08 DIAGNOSIS — M5442 Lumbago with sciatica, left side: Secondary | ICD-10-CM | POA: Diagnosis not present

## 2017-02-08 DIAGNOSIS — M542 Cervicalgia: Secondary | ICD-10-CM | POA: Diagnosis not present

## 2017-03-08 DIAGNOSIS — M5442 Lumbago with sciatica, left side: Secondary | ICD-10-CM | POA: Diagnosis not present

## 2017-03-08 DIAGNOSIS — G894 Chronic pain syndrome: Secondary | ICD-10-CM | POA: Diagnosis not present

## 2017-03-08 DIAGNOSIS — M542 Cervicalgia: Secondary | ICD-10-CM | POA: Diagnosis not present

## 2017-04-05 DIAGNOSIS — M5442 Lumbago with sciatica, left side: Secondary | ICD-10-CM | POA: Diagnosis not present

## 2017-04-05 DIAGNOSIS — G894 Chronic pain syndrome: Secondary | ICD-10-CM | POA: Diagnosis not present

## 2017-04-05 DIAGNOSIS — M542 Cervicalgia: Secondary | ICD-10-CM | POA: Diagnosis not present

## 2017-04-05 DIAGNOSIS — M7918 Myalgia, other site: Secondary | ICD-10-CM | POA: Diagnosis not present

## 2017-04-16 ENCOUNTER — Encounter: Payer: Self-pay | Admitting: Family Medicine

## 2017-04-16 DIAGNOSIS — Z0000 Encounter for general adult medical examination without abnormal findings: Secondary | ICD-10-CM | POA: Diagnosis not present

## 2017-04-23 DIAGNOSIS — Z1231 Encounter for screening mammogram for malignant neoplasm of breast: Secondary | ICD-10-CM | POA: Diagnosis not present

## 2017-05-03 ENCOUNTER — Encounter: Payer: Self-pay | Admitting: Family Medicine

## 2017-05-03 DIAGNOSIS — E7800 Pure hypercholesterolemia, unspecified: Secondary | ICD-10-CM | POA: Diagnosis not present

## 2017-05-03 DIAGNOSIS — R5383 Other fatigue: Secondary | ICD-10-CM | POA: Diagnosis not present

## 2017-05-03 DIAGNOSIS — R7302 Impaired glucose tolerance (oral): Secondary | ICD-10-CM | POA: Diagnosis not present

## 2017-05-03 DIAGNOSIS — E559 Vitamin D deficiency, unspecified: Secondary | ICD-10-CM | POA: Diagnosis not present

## 2017-05-09 ENCOUNTER — Ambulatory Visit (INDEPENDENT_AMBULATORY_CARE_PROVIDER_SITE_OTHER): Payer: 59 | Admitting: Women's Health

## 2017-05-09 ENCOUNTER — Encounter: Payer: Self-pay | Admitting: Women's Health

## 2017-05-09 VITALS — BP 124/80 | Ht 62.0 in | Wt 179.0 lb

## 2017-05-09 DIAGNOSIS — Z01419 Encounter for gynecological examination (general) (routine) without abnormal findings: Secondary | ICD-10-CM | POA: Diagnosis not present

## 2017-05-09 NOTE — Patient Instructions (Signed)
Mantenimiento de la salud de las mujeres posmenopusicas (Health Maintenance for Postmenopausal Women) La menopausia es un proceso normal en el cual se pierde la capacidad reproductiva. Este proceso ocurre gradualmente a lo largo de un perodo de meses o aos, por lo general entre los 31 y los 55aos. La menopausia es completa cuando no se han tenido 75mnstruaciones consecutivas. Es importante hablar con el mdico sobre algunas de las enfermedades ms comunes que afectan a las mujeres posmenopusicas, como la cardiopata coronaria, el cncer y la prdida de la masa sea (osteoporosis). Adoptar un estilo de vida saludable y recibir atencin preventiva pueden ayudar a promover la salud y eMusician Adems, estas medidas pueden reducir las probabilidades de desarrollar algunas de estas enfermedades frecuentes. QU DEBO SABER ACERCA DE LBernalillo Durante le mSatsop puede tener una serie de sntomas, por ejemplo:  Calores repentinos moderados a graves.  Sudoracin nocturna.  Disminucin del deseo sexual.  Cambios en el estado de nimo.  Dolores de cN[place]  Cansancio.  Irritabilidad.  Problemas de memoria.  Insomnio. Tratar o no los cambios que ocurren en la menopausia es una decisin personal que se toma con el mdico. QU DEBO SABER SOBRE LOS TRATAMIENTOS DE REPOSICIN HORMONAL Y LOS SUPLEMENTOS? Los productos para la terapia hormonal son eficaces para tratar los sntomas que se asocian con la menopausia, como los calores repentinos y las sudoraciones nocturnas. La reposicin hormonal conlleva ciertos riesgos, especialmente a medida que una mujer envejece. Si est pensando en usar tratamientos con estrgeno o estrgeno con progesterona, analice los beneficios y los riesgos con el mdico. QU DEBO SABER [place] A medida que una persona envejece, aumenta la probabilidad de tener cardiopata coronaria, infarto de miocardio e ictus. Esto puede  deberse, en parte, a los cambios hormonales que atraviesa el cuerpo durante la menopausia. Estos cambios pueden afectar la forma en que el organismo procesa las gSelden los triglicridos y el colesterol de su dieta. El infarto de miocardio y el ictus son emergencias mdicas. Hay muchas cosas que se pueden hacer para ayudar a prevenir la cardiopata coronaria y el ictus:  Debe controlar su presin arterial al menos cada uno o dCrosby La hipertensin arterial causa enfermedades cardacas y aSerbiael riesgo de ictus.  Si tiene entre 542y 79aos, consulte al mdico si debe tomar aspirina para prevenir un infarto de miocardio o un ictus.  No consuma ningn producto que contenga tabaco, lo que incluye cigarrillos, tabaco de mHigher education careers advisero cPsychologist, sport and exercise Si necesita ayuda para dejar de fumar, consulte al mMeadWestvaco  Es importante seguir una dieta sana y mTheatre managerun peso saludable. ? Asegrese de iFamily Dollar Storesverduras, frutas, productos lcteos de bajo contenido de gDjiboutiy pAdvertising account planner ? No consuma alimentos con alto contenido de grasas slidas, azcares agregados o sal (sodio).  Realice actividad fsica con regularidad. Esta es una de las cosas ms importantes que puede hacer por su salud. ? Intente realizar al menos 1524mutos de actividad fsica por semana. El tipo de ejercicio que realice debe aumentar la frecuencia cardaca y hacerla sudar. Esto se conoce como ejercicio de inMalta? Intente hacer ejercicios de elongacin por lo menos dos veces por semana. Agrguelos al plan de ejercicio de intensidad moderada.  Conozca sus cifras. Pdale al mdico que le controle el colesterol y el nivel sanguneo de glucosa. Siga hacindose anlisis de saAmerican Electric Powere lo haya indicado el mdico. QU DEBO SABER SOBRE LAS PRUEBAS DE DETECCIN DEL CNCER? Hay  varios tipos de cncer. Tome las siguientes medidas para reducir el riesgo y Actuary formacin cancerosa lo antes  posible. Cncer de mama  Practique la autoconciencia de la mama. ? Esto significa reconocer la apariencia normal de sus mamas y cmo las siente. ? Tambin significa realizar autoexmenes regulares de las [place]. Informe a su mdico sobre cualquier cambio, sin importar cun pequeo sea.  Si es mayor de 40aos, visite a un mdico para Public librarian un examen de las mamas (exploracin clnica mamaria o ECM) todos los [place]. En funcin de [employer], los antecedentes familiares y la historia [place], tal vez sea recomendable que tambin se haga una radiografa anual de las mamas [place]).  Si tiene antecedentes familiares de cncer de mama, hable con el mdico para someterse a un estudio gentico.  Si tiene alto riesgo de Chief Financial Officer de mama, hable con el mdico para hacerse a Public house manager (RM) y Lavinia Sharps todos los [place].  La evaluacin del gen del cncer de mama (BRCA) se recomienda a las mujeres que tengan familiares con tumores malignos relacionados con el BRCA. Los resultados de la evaluacin determinarn la necesidad de recibir asesoramiento gentico y Building services engineer de deteccin del BRCA1 y el BRCA2. Los tumores malignos relacionados con el BRCA incluyen estos tipos: ? Mama. Este tipo se presenta en hombres o mujeres. ? Ovario. ? Trompas. A este tipo tambin se lo llama cncer de trompa de [place]. ? Cncer de la pared abdominal o plvica (cncer de peritoneo). ? Prstata. ? Pncreas. Cncer de cuello uterino, de tero y de ovario El mdico puede recomendarle que se haga pruebas peridicas de deteccin de cncer de los rganos de la pelvis, los cuales [employer] ovarios, el tero y la vagina. Estas pruebas incluyen un examen plvico, que abarca controlar si se produjeron cambios microscpicos en la superficie del cuello del tero (prueba de Papanicolaou).  A las mujeres que [employer] 21 y 41aos, los mdicos pueden recomendarles que se realicen un examen plvico y Ardelia Mems prueba  de Papanicolaou cada tres aos. A las mujeres que tienen entre 30 y 65aos, pueden recomendarles la prueba de Papanicolaou y el examen plvico, en combinacin con una prueba de deteccin del virus del papiloma humano (VPH) [place]. Algunos tipos de VPH aumentan el riesgo de Chief Financial Officer de cuello del tero. La prueba para la deteccin del VPH tambin puede realizarse a mujeres de cualquier edad cuyos resultados de la prueba de Papanicolaou no sean claros.  Es posible que otros mdicos no recomienden exmenes de deteccin a las mujeres no embarazadas que se consideran sujetos de bajo riesgo de Chief Financial Officer de pelvis y no tienen sntomas. Pregntele al mdico si un examen plvico de deteccin es adecuado para usted.  Si ha recibido un tratamiento para Science writer cervical o una enfermedad que podra causar cncer, necesitar realizarse una prueba de Papanicolaou y controles durante al menos 58 aos de concluido el [place]. Si no se ha hecho el Papanicolaou con regularidad, debern volver a evaluarse los factores de riesgo (como tener un nuevo compaero sexual), para Teacher, adult education si debe empezar a Dispensing optician los estudios nuevamente. Algunas mujeres sufren problemas mdicos que aumentan la probabilidad de Museum/gallery curator cncer de cuello del tero. En estos casos, el mdico podr [employer] se realicen controles y pruebas de Papanicolaou con ms frecuencia.  Si tiene antecedentes familiares de cncer de tero o de ovario, hable con el mdico para someterse a un estudio gentico.  Si tiene hemorragia  de contraer cncer de cuello del tero. En estos casos, el mdico podr indicar que se realicen controles y pruebas de Papanicolaou con ms frecuencia.   Si tiene antecedentes familiares de cncer de tero o de ovario, hable con el mdico para someterse a un estudio gentico.   Si tiene hemorragia vaginal despus de la menopausia, informe al mdico.   En la actualidad, no hay pruebas confiables para la deteccin del cncer de ovario.  Cncer de pulmn  Se recomienda realizar exmenes de deteccin de cncer de pulmn a personas adultas entre 55 y 80 aos que estn en riesgo de desarrollar cncer de pulmn por sus antecedentes de consumo de tabaco. Se recomienda una tomografa computarizada (TC) de baja  dosis de los pulmones todos los aos si usted:   Fuma actualmente.   Ha fumado durante 30aos un paquete diario y sigue fumando o dej el hbito en algn momento en los ltimos 15aos. Un paquete-ao equivale a fumar en promedio un paquete de cigarrillos diario durante un ao.  Los exmenes de deteccin anuales:   Deben hacerse hasta que hayan pasado 15aos desde que dej de fumar.   Deben dejar de realizarse si tiene un problema de salud que le impide recibir tratamiento para el cncer de pulmn.  Cncer colorrectal   Este tipo de cncer puede detectarse y a menudo prevenirse.   El estudio de rutina de deteccin del cncer colorrectal debe comenzar a hacerse a partir de los 50aos y hasta los 75aos.   El mdico puede aconsejarle que lo haga antes, si tiene factores de riesgo de tener cncer de colon.   Si tiene antecedentes familiares de cncer colorrectal, hable con el mdico para someterse a un estudio gentico.   El mdico tambin puede recomendarle que use un kit de prueba para el hogar a fin de poder hallar sangre oculta en la materia fecal.   Es posible que se use una pequea cmara en el extremo de un tubo para examinar directamente el colon (sigmoidoscopia o colonoscopia) a fin de detectar formas tempranas de cncer colorrectal.   El examen directo del colon se debe repetir cada 5 a 10aos hasta los 75aos. Sin embargo, si se hallan formas incipientes de plipos precancerosos o pequeos tumores, o si tiene antecedentes familiares o riesgo gentico de tener cncer colorrectal, debe realizarse exmenes de deteccin con ms frecuencia.  Cncer de piel   Revise la piel de la cabeza a los pies con regularidad.   Contrlese los lunares. Infrmele al mdico:  ? Si aparecen nuevos lunares o los que tiene se modifican, especialmente en su forma o color.  ? Si tiene un lunar que es ms grande que el tamao de una goma de lpiz.   Si alguno de los miembros de su familia tiene antecedentes de  cncer de piel, especialmente a una edad temprana, hable con el mdico para someterse a pruebas genticas.   Siempre use pantalla solar. Aplique pantalla solar de manera libre y repetida a lo largo del da.   Protjase usando mangas y pantalones largos, un sombrero de ala ancha y gafas para el sol, siempre que est al aire libre.  QU DEBO SABER SOBRE LA OSTEOPOROSIS?  La osteoporosis es una afeccin en la cual la destruccin de la masa sea ocurre con mayor rapidez que su formacin. Despus de la menopausia, puede correr un riesgo ms alto de tener osteoporosis. Para ayudar a prevenir esta afeccin o las fracturas seas que pueden ocurrir a causa de esta,   en calcio y vitaminaD, y haga ejercicios con soporte de peso varias veces a la semana, como se lo haya indicado el mdico. QU DEBO SABER SOBRE EL MODO EN QUE LA MENOPAUSIA AFECTA MI SALUD MENTAL? La depresin puede presentarse a cualquier edad, pero es ms frecuente a medida que una persona envejece. Los sntomas comunes de depresin incluyen lo siguiente:  Desnimo o tristeza.  Cambios en los patrones de sueo.  Cambios en el apetito o en los hbitos de alimentacin.  Sensacin de falta general de motivacin o placer al [employer] actividades que sola disfrutar.  Crisis frecuentes de llanto. Hable con el mdico si cree que tiene depresin. QU DEBO SABER SOBRE LAS VACUNAS? Es importante que se aplique las vacunas y [place]. Estas incluyen las siguientes:  Vacuna contra el ttanos, la  difteria y la tosferina (Tdap).  Vacuna anual contra la gripe antes del inicio de la temporada de gripe.  Vacuna contra la neumona.  Vacuna contra el herpes. El mdico tambin puede recomendarle que se aplique otras vacunas. Esta informacin no tiene Marine scientist el consejo del mdico. Asegrese de hacerle al mdico cualquier pregunta que tenga. Document Released: 03/21/2013 Document Revised: 06/21/2014 Document Reviewed: 03/04/2015 Elsevier Interactive Patient Education  2018 [employer].

## 2017-05-09 NOTE — Progress Notes (Signed)
[name] [birth year] [id number]    History:    Presents for annual exam.  2005 TVH for fibroids.  Normal Pap and mammogram history, reports most recent 04/2017. 2012 colonoscopy with colonic polyp found to be tubular adenoma, 2017 negative colonoscopy at [facility]  2008 breast reduction. 13 pound weight gain since 2013.  History of chronic neck and back pain with fibromyalgia, managed by pain clinic. Is problems with chronic constipation due to medications. Able to work full time at [employer].  Past medical history, past surgical history, family history and social history were all reviewed and documented in the EPIC chart.   Husband with prostate cancer requiring radiation. Son 72 years old, lives at home. Daughter 48 years old, granddaughter 31 years old, lives locally.  ROS:  A ROS was performed and pertinent positives and negatives are included.  Exam:  Vitals:   05/09/17 1544  BP: 124/80  Weight: 179 lb (81.2 kg)  Height: 5\' 2"  (1.575 m)   Body mass index is 32.74 kg/m.   General appearance:  Normal Thyroid:  Symmetrical, normal in size, without palpable masses or nodularity. Respiratory  Auscultation:  Clear without wheezing or rhonchi Cardiovascular  Auscultation:  Regular rate, without rubs, murmurs or gallops  Edema/varicosities:  Not grossly evident Abdominal  Soft,nontender, without masses, guarding or rebound.  Liver/spleen:  No organomegaly noted  Hernia:  None appreciated  Skin  Inspection:  Grossly normal   Breasts: Examined lying and sitting, incisional scars from breast reduction noted    Right: Without masses, retractions, discharge or axillary adenopathy.     Left: Without masses, retractions, discharge or axillary adenopathy. Gentitourinary   Inguinal/mons:  Normal without inguinal adenopathy  External genitalia:  Normal  BUS/Urethra/Skene's glands:  Normal  Vagina:  Normal  Cervix:  Absent  Uterus:  Absent  Adnexa/parametria:     Rt: Without  masses or tenderness.   Lt: Without masses or tenderness.  Anus and perineum: Normal  Digital rectal exam: Normal sphincter tone without palpated masses or tenderness  Assessment/Plan:  51 y.o.  MHF G2P2 presents for annual exam.     TVH Chronic neck and back pain, fibromyalgia-managed by pain clinic Hyperlipidemia-labs and meds managed by primary care Obesity  Plan: SBE's, continue annual screening mammograms. Encouraged active lifestyle with aerobic exercise and healthy diet. Attributes weight gain to chronic neck/back pain and steroid injections. Encouraged calcium rich diet, vitamin D 2000 daily.  [place], 4:19 PM 05/09/2017

## 2017-05-31 DIAGNOSIS — M542 Cervicalgia: Secondary | ICD-10-CM | POA: Diagnosis not present

## 2017-05-31 DIAGNOSIS — M5442 Lumbago with sciatica, left side: Secondary | ICD-10-CM | POA: Diagnosis not present

## 2017-05-31 DIAGNOSIS — G894 Chronic pain syndrome: Secondary | ICD-10-CM | POA: Diagnosis not present

## 2017-07-26 DIAGNOSIS — M5442 Lumbago with sciatica, left side: Secondary | ICD-10-CM | POA: Diagnosis not present

## 2017-07-26 DIAGNOSIS — M542 Cervicalgia: Secondary | ICD-10-CM | POA: Diagnosis not present

## 2017-07-26 DIAGNOSIS — G894 Chronic pain syndrome: Secondary | ICD-10-CM | POA: Diagnosis not present

## 2017-08-18 ENCOUNTER — Other Ambulatory Visit: Payer: Self-pay

## 2017-08-18 ENCOUNTER — Telehealth: Payer: Self-pay | Admitting: Family Medicine

## 2017-08-18 ENCOUNTER — Ambulatory Visit (INDEPENDENT_AMBULATORY_CARE_PROVIDER_SITE_OTHER): Payer: 59 | Admitting: Family Medicine

## 2017-08-18 ENCOUNTER — Encounter: Payer: Self-pay | Admitting: Family Medicine

## 2017-08-18 VITALS — BP 122/64 | HR 97 | Temp 98.8°F | Ht 60.5 in | Wt 174.0 lb

## 2017-08-18 DIAGNOSIS — R7303 Prediabetes: Secondary | ICD-10-CM

## 2017-08-18 DIAGNOSIS — K760 Fatty (change of) liver, not elsewhere classified: Secondary | ICD-10-CM | POA: Diagnosis not present

## 2017-08-18 DIAGNOSIS — L819 Disorder of pigmentation, unspecified: Secondary | ICD-10-CM | POA: Diagnosis not present

## 2017-08-18 DIAGNOSIS — E559 Vitamin D deficiency, unspecified: Secondary | ICD-10-CM | POA: Diagnosis not present

## 2017-08-18 DIAGNOSIS — M5030 Other cervical disc degeneration, unspecified cervical region: Secondary | ICD-10-CM | POA: Diagnosis not present

## 2017-08-18 DIAGNOSIS — D126 Benign neoplasm of colon, unspecified: Secondary | ICD-10-CM | POA: Diagnosis not present

## 2017-08-18 NOTE — Patient Instructions (Signed)
     IF you received an x-ray today, you will receive an invoice from [facility]. Please contact [facility] at [phone] with questions or concerns regarding your invoice.   IF you received labwork today, you will receive an invoice from [facility]. Please contact [facility] at [phone] with questions or concerns regarding your invoice.   Our billing staff will not be able to assist you with questions regarding bills from these companies.  You will be contacted with the lab results as soon as they are available. The fastest way to get your results is to activate your My Chart account. Instructions are located on the last page of this paperwork. If you have not heard from us regarding the results in 2 weeks, please contact this office.     

## 2017-08-18 NOTE — Progress Notes (Addendum)
3/7/20194:21 PM  [name] Nov 19, 1965, 52 y.o. female [id number]  Chief Complaint  Patient presents with  . Color Reduction    Has discoloration on the arms and around her mouth since Novermber    HPI:   Patient is a 52 y.o. female who presents today for darkening of skin of chin, temples and forearm. Areas of color change in her face have become very dark, purplish. She states that she saw derm and was told it was benign and age related. She was given bleaching cream. She did not tolerate bleaching cream and was not satisfied with this answer. She denies any other family members with similar.   Depression screen PHQ 2/9 08/18/2017  Decreased Interest 0  Down, Depressed, Hopeless 0  PHQ - 2 Score 0    Allergies  Allergen Reactions  . Other     LATEX    Prior to Admission medications   Medication Sig Start Date End Date Taking? Authorizing Provider  Calcium Carbonate-Vitamin D (CALTRATE 600+D PO) Take by mouth.   Yes [provider]  cholecalciferol (VITAMIN D) 1000 UNITS tablet Take 1,000 Units by mouth 2 (two) times daily.     Yes [provider]  fish oil-omega-3 fatty acids 1000 MG capsule Take 2 g by mouth daily.     Yes [provider]  gabapentin (NEURONTIN) 100 MG capsule Take 1 capsule by mouth 4 (four) times daily. 04/05/17  Yes [provider]  omeprazole (PRILOSEC) 40 MG capsule Take 1 capsule (40 mg total) by mouth daily. 05/20/11  Yes Esterwood, Amy S, PA-C  oxyCODONE-acetaminophen (PERCOCET/ROXICET) 5-325 MG tablet as needed. 05/06/17  Yes [provider]  pravastatin (PRAVACHOL) 40 MG tablet Take 40 mg by mouth daily.     Yes [provider]    Past Medical History:  Diagnosis Date  . Acid reflux   . Adenomatous colon polyp    2012  . Anxiety   . Constipation, chronic   . Degenerative disc disease, cervical    s/p epidural injections  . Depression   . Fatty liver disease, nonalcoholic    per ultrasound  . Fibromyalgia   . Hypercholesterolemia   . NSVD (normal spontaneous vaginal delivery)    X2  . Prediabetes    a1c 6.1  . Seizures ([place])    with dental procedure  . Vitamin D deficiency     Past Surgical History:  Procedure Laterality Date  . BACK SURGERY  2008  . BREAST SURGERY     REDUCTIVE MAMMOPLASTY  . herniated disc    . LAPAROSCOPIC CHOLECYSTECTOMY    . LUMBAR DISC SURGERY     L-4 AND L-5   . PELVIC LAPAROSCOPY     BTSP  . TONSILLECTOMY AND ADENOIDECTOMY    . VAGINAL HYSTERECTOMY     Post Repair/TVT Sling    Social History   Tobacco Use  . Smoking status: Never Smoker  . Smokeless tobacco: Never Used  Substance Use Topics  . Alcohol use: No    Family History  Problem Relation Age of Onset  . Hypertension Sister   . Cancer Sister        colon ca... stepsister  . Colon cancer Sister     Review of Systems  Constitutional: Negative for chills, diaphoresis, fever, malaise/fatigue and weight loss.  HENT: Negative for congestion, ear pain, hearing loss and sore throat.   Eyes: Negative for blurred vision and double vision.  Respiratory: Negative for cough and shortness of  breath.   Cardiovascular: Negative for chest pain, palpitations and leg swelling.  Gastrointestinal: Negative for abdominal pain, constipation, diarrhea, nausea and vomiting.  Genitourinary: Negative for dysuria, frequency, hematuria and urgency.  Musculoskeletal: Positive for myalgias.  Skin: Negative for itching.  Neurological: Negative for dizziness, loss of consciousness and headaches.  Endo/Heme/Allergies: Negative for polydipsia. Does not bruise/bleed easily.  Psychiatric/Behavioral: Negative for depression. The patient is not nervous/anxious.      OBJECTIVE:  Blood pressure 122/64, pulse 97, temperature 98.8 F (37.1 C), temperature source Oral, height 5' 0.5" (1.537 m), weight 174 lb (78.9 kg), SpO2 97 %.  Physical Exam  Constitutional: She is oriented to  person, place, and time and well-developed, well-nourished, and in no distress.  HENT:  Head: Normocephalic and atraumatic.  Right Ear: Hearing, tympanic membrane, external ear and ear canal normal.  Left Ear: Hearing, tympanic membrane, external ear and ear canal normal.  Mouth/Throat: Oropharynx is clear and moist.  Eyes: EOM are normal. Pupils are equal, round, and reactive to light.  Neck: Neck supple. No thyromegaly present.  Cardiovascular: Normal rate, regular rhythm, normal heart sounds and intact distal pulses. Exam reveals no gallop and no friction rub.  No murmur heard. Pulmonary/Chest: Effort normal and breath sounds normal. She has no wheezes. She has no rales.  Abdominal: Soft. Bowel sounds are normal. She exhibits no distension and no mass. There is no tenderness.  Musculoskeletal: Normal range of motion. She exhibits no edema.  Lymphadenopathy:    She has no cervical adenopathy.  Neurological: She is alert and oriented to person, place, and time. She has normal reflexes. Gait normal.  Skin: Skin is warm and dry.  Large purplish patches on chin, temples, forehead. Very faint patches noted on extensor surface of forerms  Psychiatric: Mood and affect normal.  Nursing note and vitals reviewed.     ASSESSMENT and PLAN  1. Hyperpigmentation of skin - Ambulatory referral to Dermatology  Return for after derm.    Rutherford Guys, MD [facility] [place], [place] [address] Ph.  [phone] Fax [phone]

## 2017-08-18 NOTE — Telephone Encounter (Signed)
Called and left a VM reminding pt of their apt today. . I advised of the time, building number and time regulations.

## 2017-08-22 ENCOUNTER — Encounter: Payer: Self-pay | Admitting: Family Medicine

## 2017-08-22 DIAGNOSIS — M5030 Other cervical disc degeneration, unspecified cervical region: Secondary | ICD-10-CM | POA: Insufficient documentation

## 2017-08-22 DIAGNOSIS — R7303 Prediabetes: Secondary | ICD-10-CM | POA: Insufficient documentation

## 2017-08-22 DIAGNOSIS — K760 Fatty (change of) liver, not elsewhere classified: Secondary | ICD-10-CM | POA: Insufficient documentation

## 2017-08-22 DIAGNOSIS — D126 Benign neoplasm of colon, unspecified: Secondary | ICD-10-CM | POA: Insufficient documentation

## 2017-08-22 DIAGNOSIS — E559 Vitamin D deficiency, unspecified: Secondary | ICD-10-CM | POA: Insufficient documentation

## 2017-08-23 DIAGNOSIS — M5442 Lumbago with sciatica, left side: Secondary | ICD-10-CM | POA: Diagnosis not present

## 2017-08-23 DIAGNOSIS — G894 Chronic pain syndrome: Secondary | ICD-10-CM | POA: Diagnosis not present

## 2017-08-23 DIAGNOSIS — M542 Cervicalgia: Secondary | ICD-10-CM | POA: Diagnosis not present

## 2017-10-13 DIAGNOSIS — Z7689 Persons encountering health services in other specified circumstances: Secondary | ICD-10-CM | POA: Diagnosis not present

## 2017-10-13 DIAGNOSIS — L818 Other specified disorders of pigmentation: Secondary | ICD-10-CM | POA: Diagnosis not present

## 2017-10-18 DIAGNOSIS — M542 Cervicalgia: Secondary | ICD-10-CM | POA: Diagnosis not present

## 2017-10-18 DIAGNOSIS — M5442 Lumbago with sciatica, left side: Secondary | ICD-10-CM | POA: Diagnosis not present

## 2017-10-18 DIAGNOSIS — G894 Chronic pain syndrome: Secondary | ICD-10-CM | POA: Diagnosis not present

## 2017-10-27 ENCOUNTER — Encounter: Payer: Self-pay | Admitting: Family Medicine

## 2017-10-27 ENCOUNTER — Other Ambulatory Visit: Payer: Self-pay

## 2017-10-27 ENCOUNTER — Ambulatory Visit (INDEPENDENT_AMBULATORY_CARE_PROVIDER_SITE_OTHER): Payer: 59 | Admitting: Family Medicine

## 2017-10-27 VITALS — BP 126/82 | HR 80 | Temp 99.3°F | Ht 61.0 in | Wt 175.0 lb

## 2017-10-27 DIAGNOSIS — Z86010 Personal history of colonic polyps: Secondary | ICD-10-CM

## 2017-10-27 DIAGNOSIS — E559 Vitamin D deficiency, unspecified: Secondary | ICD-10-CM

## 2017-10-27 DIAGNOSIS — R7303 Prediabetes: Secondary | ICD-10-CM | POA: Diagnosis not present

## 2017-10-27 DIAGNOSIS — R790 Abnormal level of blood mineral: Secondary | ICD-10-CM | POA: Diagnosis not present

## 2017-10-27 DIAGNOSIS — Z119 Encounter for screening for infectious and parasitic diseases, unspecified: Secondary | ICD-10-CM

## 2017-10-27 DIAGNOSIS — M5030 Other cervical disc degeneration, unspecified cervical region: Secondary | ICD-10-CM

## 2017-10-27 DIAGNOSIS — E785 Hyperlipidemia, unspecified: Secondary | ICD-10-CM

## 2017-10-27 MED ORDER — PRAVASTATIN SODIUM 40 MG PO TABS: 40.0000 mg | ORAL_TABLET | Freq: Every day | ORAL | 3 refills | Status: DC

## 2017-10-27 NOTE — Progress Notes (Signed)
5/16/20199:21 AM  [name] 01-06-1966, 52 y.o. female [id number]  Chief Complaint  Patient presents with  . wants lab work done    HPI:   Patient is a 52 y.o. female with past medical history significant for pre-diabetes, vitamin D, HLP, colonic polyps and DDD who presents today requesting lab work  She reports that she saw second derm, given same diagnosis and treatment, has been using bleaching cream and happy with improvements in hyperpigmentation She reports last colonoscopy was in 2012, + polyps, repeat in 5 years Reports a normal EGD in 2012 However she reports recent worsening constipation, bloating, abd pain with fatty foods She does not drink soda, and not eat bread but otherwise diet high in juice and starchy foods She sees Dr Damaris Hippo from [facility] for pain mgt for chronic back pain S/p lumbar spine surgery x 3 Treated with epidural injections to cervical and lumbar spine Requesting refill of pravastatin, takes daily, denies se Takes daily vitamin D She is also requesting iron level to be checked, reports h/o anemia  Fall Risk  10/27/2017 08/18/2017  Falls in the past year? No No     Depression screen [facility] 2/9 10/27/2017 08/18/2017  Decreased Interest 0 0  Down, Depressed, Hopeless 0 0  PHQ - 2 Score 0 0    Allergies  Allergen Reactions  . Other     LATEX, kiwi, papaya, fish    Prior to Admission medications   Medication Sig Start Date End Date Taking? Authorizing Provider  Calcium Carbonate-Vitamin D (CALTRATE 600+D PO) Take by mouth.    [provider]  cholecalciferol (VITAMIN D) 1000 UNITS tablet Take 1,000 Units by mouth 2 (two) times daily.      [provider]  fish oil-omega-3 fatty acids 1000 MG capsule Take 2 g by mouth daily.      [provider]  gabapentin (NEURONTIN) 100 MG capsule Take 1 capsule by mouth 4 (four) times daily. 04/05/17   [provider]  omeprazole (PRILOSEC) 40 MG  capsule Take 1 capsule (40 mg total) by mouth daily. 05/20/11   Esterwood, Amy S, PA-C  pravastatin (PRAVACHOL) 40 MG tablet Take 40 mg by mouth daily.      [provider]  tizanidine (ZANAFLEX) 2 MG capsule Take 2 mg by mouth 3 (three) times daily.    [provider]    Past Medical History:  Diagnosis Date  . Acid reflux   . Adenomatous colon polyp    2012  . Anxiety   . Constipation, chronic   . Degenerative disc disease, cervical    s/p epidural injections  . Depression   . Fatty liver disease, nonalcoholic    per ultrasound  . Fibromyalgia   . Hypercholesterolemia   . NSVD (normal spontaneous vaginal delivery)    X2  . Prediabetes    a1c 6.1  . Seizures ([place])    with dental procedure  . Vitamin D deficiency     Past Surgical History:  Procedure Laterality Date  . BACK SURGERY  2008  . BREAST SURGERY     REDUCTIVE MAMMOPLASTY  . herniated disc    . LAPAROSCOPIC CHOLECYSTECTOMY    . LUMBAR DISC SURGERY     L-4 AND L-5   . PELVIC LAPAROSCOPY     BTSP  . TONSILLECTOMY AND ADENOIDECTOMY    . VAGINAL HYSTERECTOMY     Post Repair/TVT Sling    Social History   Tobacco Use  .  Smoking status: Never Smoker  . Smokeless tobacco: Never Used  Substance Use Topics  . Alcohol use: No    Family History  Problem Relation Age of Onset  . Hypertension Sister   . Cancer Sister        colon ca... stepsister  . Colon cancer Sister     Review of Systems  Constitutional: Positive for malaise/fatigue. Negative for chills and fever.  Respiratory: Negative for cough and shortness of breath.   Cardiovascular: Negative for chest pain, palpitations and leg swelling.  Gastrointestinal: Positive for abdominal pain, constipation, heartburn and nausea. Negative for blood in stool, diarrhea, melena and vomiting.  Musculoskeletal: Positive for back pain and neck pain.  Neurological: Positive for headaches.  Endo/Heme/Allergies: Negative for polydipsia.  All  other systems reviewed and are negative.    OBJECTIVE:  Blood pressure 126/82, pulse 80, temperature 99.3 F (37.4 C), temperature source Oral, height 5\' 1"  (1.549 m), weight 175 lb (79.4 kg), SpO2 97 %.  Physical Exam  Constitutional: She is oriented to person, place, and time. She appears well-developed and well-nourished.  HENT:  Head: Normocephalic and atraumatic.  Right Ear: Hearing, tympanic membrane, external ear and ear canal normal.  Left Ear: Hearing, tympanic membrane, external ear and ear canal normal.  Mouth/Throat: Oropharynx is clear and moist.  Eyes: Pupils are equal, round, and reactive to light. Conjunctivae and EOM are normal.  Neck: Neck supple. No thyromegaly present.  Cardiovascular: Normal rate, regular rhythm, normal heart sounds and intact distal pulses. Exam reveals no gallop and no friction rub.  No murmur heard. Pulmonary/Chest: Effort normal and breath sounds normal. She has no wheezes. She has no rales.  Abdominal: Soft. Bowel sounds are normal. She exhibits no distension and no mass. There is no hepatosplenomegaly. There is generalized tenderness. There is no rebound and no guarding.  Musculoskeletal: Normal range of motion. She exhibits no edema.  Lymphadenopathy:    She has no cervical adenopathy.  Neurological: She is alert and oriented to person, place, and time. She has normal reflexes. No cranial nerve deficit. Gait normal.  Skin: Skin is warm and dry.  Psychiatric: She has a normal mood and affect.  Nursing note and vitals reviewed.   ASSESSMENT and PLAN  1. Hyperlipidemia, unspecified hyperlipidemia type Checking labs. Refilling pravastatin. Discussed diet and exercise - Comprehensive metabolic panel - TSH - Lipid panel  2. Prediabetes Checking labs. Discussed diet and exercise. - Hemoglobin A1c  3. History of colonic polyps Colonoscopy was due in 2017, referring to GI for surveillance - Ambulatory referral to Gastroenterology  4.  Degenerative disc disease, cervical Managed by pain mgt.  5. Vitamin D deficiency Checking levels, currently on OTC supplementation. Discussed dietary sources. - Vitamin D, 25-hydroxy  6. Low iron stores Checking levels. Discussed dietary sources. - CBC - Ferritin  7. Screening examination for infectious disease Low risk. Does not think she has ever been tested. - HIV antibody (with reflex)  Other orders - pravastatin (PRAVACHOL) 40 MG tablet; Take 1 tablet (40 mg total) by mouth daily.  Return in about 1 month (around 11/24/2017).    Rutherford Guys, MD Primary Care at [place],  [address] Ph.  [phone] Fax [phone]

## 2017-10-27 NOTE — Patient Instructions (Signed)
     IF you received an x-ray today, you will receive an invoice from [facility]. Please contact [facility] at [phone] with questions or concerns regarding your invoice.   IF you received labwork today, you will receive an invoice from [facility]. Please contact [facility] at [phone] with questions or concerns regarding your invoice.   Our billing staff will not be able to assist you with questions regarding bills from these companies.  You will be contacted with the lab results as soon as they are available. The fastest way to get your results is to activate your My Chart account. Instructions are located on the last page of this paperwork. If you have not heard from us regarding the results in 2 weeks, please contact this office.     

## 2017-10-28 LAB — COMPREHENSIVE METABOLIC PANEL
ALT: 68 IU/L — ABNORMAL HIGH (ref 0–32)
AST: 45 IU/L — ABNORMAL HIGH (ref 0–40)
Albumin/Globulin Ratio: 1.7 (ref 1.2–2.2)
Albumin: 4.8 g/dL (ref 3.5–5.5)
Alkaline Phosphatase: 67 IU/L (ref 39–117)
BUN/Creatinine Ratio: 16 (ref 9–23)
BUN: 9 mg/dL (ref 6–24)
Bilirubin Total: 0.7 mg/dL (ref 0.0–1.2)
CO2: 22 mmol/L (ref 20–29)
Calcium: 10.1 mg/dL (ref 8.7–10.2)
Chloride: 103 mmol/L (ref 96–106)
Creatinine, Ser: 0.56 mg/dL — ABNORMAL LOW (ref 0.57–1.00)
GFR calc Af Amer: 124 mL/min/{1.73_m2} (ref >59)
GFR calc non Af Amer: 108 mL/min/{1.73_m2} (ref >59)
Globulin, Total: 2.9 g/dL (ref 1.5–4.5)
Glucose: 87 mg/dL (ref 65–99)
Potassium: 4.8 mmol/L (ref 3.5–5.2)
Sodium: 140 mmol/L (ref 134–144)
Total Protein: 7.7 g/dL (ref 6.0–8.5)

## 2017-10-28 LAB — LIPID PANEL
Chol/HDL Ratio: 3.8 ratio (ref 0.0–4.4)
Cholesterol, Total: 219 mg/dL — ABNORMAL HIGH (ref 100–199)
HDL: 58 mg/dL (ref >39)
LDL Calculated: 120 mg/dL — ABNORMAL HIGH (ref 0–99)
Triglycerides: 205 mg/dL — ABNORMAL HIGH (ref 0–149)
VLDL Cholesterol Cal: 41 mg/dL — ABNORMAL HIGH (ref 5–40)

## 2017-10-28 LAB — CBC
Hematocrit: 41.7 % (ref 34.0–46.6)
Hemoglobin: 14 g/dL (ref 11.1–15.9)
MCH: 30.4 pg (ref 26.6–33.0)
MCHC: 33.6 g/dL (ref 31.5–35.7)
MCV: 91 fL (ref 79–97)
Platelets: 362 10*3/uL (ref 150–379)
RBC: 4.61 x10E6/uL (ref 3.77–5.28)
RDW: 13.7 % (ref 12.3–15.4)
WBC: 9.6 10*3/uL (ref 3.4–10.8)

## 2017-10-28 LAB — HIV ANTIBODY (ROUTINE TESTING W REFLEX): HIV Screen 4th Generation wRfx: NONREACTIVE

## 2017-10-28 LAB — TSH: TSH: 1 u[IU]/mL (ref 0.450–4.500)

## 2017-10-28 LAB — HEMOGLOBIN A1C
Est. average glucose Bld gHb Est-mCnc: 128 mg/dL
Hgb A1c MFr Bld: 6.1 % — ABNORMAL HIGH (ref 4.8–5.6)

## 2017-10-28 LAB — VITAMIN D 25 HYDROXY (VIT D DEFICIENCY, FRACTURES): Vit D, 25-Hydroxy: 28 ng/mL — ABNORMAL LOW (ref 30.0–100.0)

## 2017-10-28 LAB — FERRITIN: Ferritin: 149 ng/mL (ref 15–150)

## 2017-11-17 ENCOUNTER — Encounter: Payer: Self-pay | Admitting: Family Medicine

## 2017-11-29 ENCOUNTER — Other Ambulatory Visit: Payer: Self-pay

## 2017-11-29 ENCOUNTER — Encounter: Payer: Self-pay | Admitting: Family Medicine

## 2017-11-29 ENCOUNTER — Ambulatory Visit (INDEPENDENT_AMBULATORY_CARE_PROVIDER_SITE_OTHER): Payer: 59 | Admitting: Family Medicine

## 2017-11-29 VITALS — BP 118/78 | HR 100 | Temp 98.1°F | Ht 60.0 in | Wt 176.4 lb

## 2017-11-29 DIAGNOSIS — K760 Fatty (change of) liver, not elsewhere classified: Secondary | ICD-10-CM

## 2017-11-29 DIAGNOSIS — R7303 Prediabetes: Secondary | ICD-10-CM | POA: Diagnosis not present

## 2017-11-29 DIAGNOSIS — E559 Vitamin D deficiency, unspecified: Secondary | ICD-10-CM | POA: Diagnosis not present

## 2017-11-29 DIAGNOSIS — E785 Hyperlipidemia, unspecified: Secondary | ICD-10-CM | POA: Diagnosis not present

## 2017-11-29 NOTE — Progress Notes (Signed)
6/18/20194:58 PM  [name] [birth year], 52 y.o. female [id number]  Chief Complaint  Patient presents with  . Results    here to discuss results from last visit    HPI:   Patient is a 52 y.o. female who presents today for followup on recent labs done during CPE  Patient has no acute concerns today  Fall Risk  11/29/2017 10/27/2017 08/18/2017  Falls in the past year? No No No     Depression screen [facility] 2/9 11/29/2017 10/27/2017 08/18/2017  Decreased Interest 0 0 0  Down, Depressed, Hopeless 0 0 0  PHQ - 2 Score 0 0 0    Allergies  Allergen Reactions  . Other     LATEX, kiwi, papaya, fish    Prior to Admission medications   Medication Sig Start Date End Date Taking? Authorizing Provider  Calcium Carbonate-Vitamin D (CALTRATE 600+D PO) Take by mouth.   Yes [provider]  cholecalciferol (VITAMIN D) 1000 UNITS tablet Take 1,000 Units by mouth 2 (two) times daily.     Yes [provider]  fish oil-omega-3 fatty acids 1000 MG capsule Take 2 g by mouth daily.     Yes [provider]  gabapentin (NEURONTIN) 100 MG capsule Take 1 capsule by mouth 4 (four) times daily. 04/05/17  Yes [provider]  omeprazole (PRILOSEC) 40 MG capsule Take 1 capsule (40 mg total) by mouth daily. 05/20/11  Yes Esterwood, Amy S, PA-C  pravastatin (PRAVACHOL) 40 MG tablet Take 1 tablet (40 mg total) by mouth daily. 10/27/17  Yes Rutherford Guys, MD  tizanidine (ZANAFLEX) 2 MG capsule Take 2 mg by mouth 3 (three) times daily.   Yes [provider]    Past Medical History:  Diagnosis Date  . Acid reflux   . Adenomatous colon polyp    2012  . Anxiety   . Constipation, chronic   . Degenerative disc disease, cervical    s/p epidural injections  . Depression   . Fatty liver disease, nonalcoholic    per ultrasound  . Fibromyalgia   . Hypercholesterolemia   . NSVD (normal spontaneous vaginal delivery)    X2  . Prediabetes    a1c 6.1  .  Seizures ([place])    with dental procedure  . Vitamin D deficiency     Past Surgical History:  Procedure Laterality Date  . BACK SURGERY  2008  . BREAST SURGERY     REDUCTIVE MAMMOPLASTY  . herniated disc    . LAPAROSCOPIC CHOLECYSTECTOMY    . LUMBAR DISC SURGERY     L-4 AND L-5   . PELVIC LAPAROSCOPY     BTSP  . TONSILLECTOMY AND ADENOIDECTOMY    . VAGINAL HYSTERECTOMY     Post Repair/TVT Sling    Social History   Tobacco Use  . Smoking status: Never Smoker  . Smokeless tobacco: Never Used  Substance Use Topics  . Alcohol use: No    Family History  Problem Relation Age of Onset  . Hypertension Sister   . Cancer Sister        colon ca... stepsister  . Colon cancer Sister     ROS Per hpi  OBJECTIVE:  Blood pressure 118/78, pulse 100, temperature 98.1 F (36.7 C), temperature source Oral, height 5' (1.524 m), weight 176 lb 6.4 oz (80 kg), SpO2 98 %.  Physical Exam  Constitutional: She is oriented to person, place, and time. She appears well-developed and well-nourished.  HENT:  Head: Normocephalic and  atraumatic.  Mouth/Throat: Mucous membranes are normal.  Eyes: Pupils are equal, round, and reactive to light. EOM are normal. No scleral icterus.  Neck: Neck supple.  Pulmonary/Chest: Effort normal.  Neurological: She is alert and oriented to person, place, and time.  Skin: Skin is warm and dry.  Nursing note and vitals reviewed.    A1c 6,1 AST/ALT 40-60 range LDL 120 Vit D 28  ASSESSMENT and PLAN  1. Prediabetes 2. Hyperlipidemia, unspecified hyperlipidemia type 3. Fatty liver disease, nonalcoholic  Discussed importance of low carb/low fat diet, regular exercise and healthy weight. Discussed long term health effects.   4. Vitamin D deficiency Increase OTC vit D from 2000u to 3000u daily  Return in about 6 months (around 05/31/2018).    Rutherford Guys, MD Primary Care at [place], [place] [address] Ph.  [phone] Fax  [phone]

## 2017-11-29 NOTE — Patient Instructions (Signed)
     IF you received an x-ray today, you will receive an invoice from [facility]. Please contact [facility] at [phone] with questions or concerns regarding your invoice.   IF you received labwork today, you will receive an invoice from [facility]. Please contact [facility] at [phone] with questions or concerns regarding your invoice.   Our billing staff will not be able to assist you with questions regarding bills from these companies.  You will be contacted with the lab results as soon as they are available. The fastest way to get your results is to activate your My Chart account. Instructions are located on the last page of this paperwork. If you have not heard from us regarding the results in 2 weeks, please contact this office.     

## 2017-11-30 DIAGNOSIS — G894 Chronic pain syndrome: Secondary | ICD-10-CM | POA: Diagnosis not present

## 2017-11-30 DIAGNOSIS — M542 Cervicalgia: Secondary | ICD-10-CM | POA: Diagnosis not present

## 2017-11-30 DIAGNOSIS — M5442 Lumbago with sciatica, left side: Secondary | ICD-10-CM | POA: Diagnosis not present

## 2017-12-14 DIAGNOSIS — D485 Neoplasm of uncertain behavior of skin: Secondary | ICD-10-CM | POA: Diagnosis not present

## 2017-12-14 DIAGNOSIS — D2239 Melanocytic nevi of other parts of face: Secondary | ICD-10-CM | POA: Diagnosis not present

## 2017-12-14 DIAGNOSIS — D2372 Other benign neoplasm of skin of left lower limb, including hip: Secondary | ICD-10-CM | POA: Diagnosis not present

## 2017-12-14 DIAGNOSIS — L089 Local infection of the skin and subcutaneous tissue, unspecified: Secondary | ICD-10-CM | POA: Diagnosis not present

## 2018-01-03 ENCOUNTER — Encounter: Payer: Self-pay | Admitting: Family Medicine

## 2018-01-24 DIAGNOSIS — G894 Chronic pain syndrome: Secondary | ICD-10-CM | POA: Diagnosis not present

## 2018-01-24 DIAGNOSIS — M5442 Lumbago with sciatica, left side: Secondary | ICD-10-CM | POA: Diagnosis not present

## 2018-01-24 DIAGNOSIS — M542 Cervicalgia: Secondary | ICD-10-CM | POA: Diagnosis not present

## 2018-02-08 ENCOUNTER — Ambulatory Visit (INDEPENDENT_AMBULATORY_CARE_PROVIDER_SITE_OTHER): Payer: 59 | Admitting: Family Medicine

## 2018-02-08 ENCOUNTER — Encounter: Payer: Self-pay | Admitting: Family Medicine

## 2018-02-08 VITALS — BP 129/78 | HR 107 | Temp 98.9°F | Resp 17 | Ht 60.0 in | Wt 172.0 lb

## 2018-02-08 DIAGNOSIS — G894 Chronic pain syndrome: Secondary | ICD-10-CM

## 2018-02-08 DIAGNOSIS — M5030 Other cervical disc degeneration, unspecified cervical region: Secondary | ICD-10-CM | POA: Diagnosis not present

## 2018-02-08 DIAGNOSIS — M797 Fibromyalgia: Secondary | ICD-10-CM

## 2018-02-08 MED ORDER — OXYCODONE HCL 5 MG PO TABS: 5.0000 mg | ORAL_TABLET | Freq: Four times a day (QID) | ORAL | 0 refills | Status: DC | PRN

## 2018-02-08 MED ORDER — PRAVASTATIN SODIUM 40 MG PO TABS: 40.0000 mg | ORAL_TABLET | Freq: Every day | ORAL | 3 refills | Status: DC

## 2018-02-08 MED ORDER — GABAPENTIN 100 MG PO CAPS: 200.0000 mg | ORAL_CAPSULE | Freq: Two times a day (BID) | ORAL | 1 refills | Status: DC

## 2018-02-08 MED ORDER — IBUPROFEN 800 MG PO TABS: 800.0000 mg | ORAL_TABLET | Freq: Three times a day (TID) | ORAL | 0 refills | Status: DC | PRN

## 2018-02-08 NOTE — Progress Notes (Signed)
8/28/20195:39 PM  [name] 18-Sep-1965, 52 y.o. female [id number]  Chief Complaint  Patient presents with  . Back Pain    HPI:   Patient is a 52 y.o. female with past medical history significant for pre-diabetes, vitamin D, HLP, colonic polyps and DDD  who presents today for back pain  Requesting referral to new pain specialist Last appt in June Pain not well controlled of recent Used to see [facility], had to leave due to transportation, wanting to go back, feels pain was better controlled when she was seeing them Now sees Dr Damaris Hippo from [facility] for pain mgt he has significantly cut back on her opiates S/p lumbar spine surgery x 3 Treated with epidural injections to cervical and lumbar spine  Needs [facility]ill of pravastatin [facility]  Fall Risk  11/29/2017 10/27/2017 08/18/2017  Falls in the past year? No No No     Depression screen [facility] 2/9 11/29/2017 10/27/2017 08/18/2017  Decreased Interest 0 0 0  Down, Depressed, Hopeless 0 0 0  PHQ - 2 Score 0 0 0    Allergies  Allergen Reactions  . Other     LATEX, kiwi, papaya, fish    Prior to Admission medications   Medication Sig Start Date End Date Taking? Authorizing Provider  Calcium Carbonate-Vitamin D (CALTRATE 600+D PO) Take by mouth.   Yes [provider]  cholecalciferol (VITAMIN D) 1000 UNITS tablet Take 1,000 Units by mouth 2 (two) times daily.     Yes [provider]  fish oil-omega-3 fatty acids 1000 MG capsule Take 2 g by mouth daily.     Yes [provider]  gabapentin (NEURONTIN) 100 MG capsule Take 1 capsule by mouth 4 (four) times daily. 04/05/17  Yes [provider]  omeprazole (PRILOSEC) 40 MG capsule Take 1 capsule (40 mg total) by mouth daily. 05/20/11  Yes Esterwood, Amy S, PA-C  pravastatin (PRAVACHOL) 40 MG tablet Take 1 tablet (40 mg total) by mouth daily. 10/27/17  Yes Rutherford Guys, MD  tizanidine (ZANAFLEX) 2 MG capsule Take  2 mg by mouth 3 (three) times daily.   Yes [provider]    Past Medical History:  Diagnosis Date  . Acid reflux   . Adenomatous colon polyp    2012  . Anxiety   . Constipation, chronic   . Degenerative disc disease, cervical    s/p epidural injections  . Depression   . Fatty liver disease, nonalcoholic    per ultrasound  . Fibromyalgia   . Hypercholesterolemia   . NSVD (normal spontaneous vaginal delivery)    X2  . Prediabetes    a1c 6.1  . Seizures ([place])    with dental procedure  . Vitamin D deficiency     Past Surgical History:  Procedure Laterality Date  . BACK SURGERY  2008  . BREAST SURGERY     REDUCTIVE MAMMOPLASTY  . herniated disc    . LAPAROSCOPIC CHOLECYSTECTOMY    . LUMBAR DISC SURGERY     L-4 AND L-5   . PELVIC LAPAROSCOPY     BTSP  . TONSILLECTOMY AND ADENOIDECTOMY    . VAGINAL HYSTERECTOMY     Post Repair/TVT Sling    Social History   Tobacco Use  . Smoking status: Never Smoker  . Smokeless tobacco: Never Used  Substance Use Topics  . Alcohol use: No    Family History  Problem Relation Age of Onset  . Hypertension Sister   .  Cancer Sister        colon ca... stepsister  . Colon cancer Sister     ROS Per hpi  OBJECTIVE:  Blood pressure 129/78, pulse (!) 107, temperature 98.9 F (37.2 C), temperature source Oral, resp. rate 17, height 5' (1.524 m), weight 172 lb (78 kg), SpO2 98 %. Body mass index is 33.59 kg/m.   Physical Exam  Constitutional: She is oriented to person, place, and time. She appears well-developed and well-nourished.  HENT:  Head: Normocephalic and atraumatic.  Mouth/Throat: Mucous membranes are normal.  Eyes: Pupils are equal, round, and reactive to light. Conjunctivae and EOM are normal. No scleral icterus.  Neck: Neck supple.  Pulmonary/Chest: Effort normal.  Neurological: She is alert and oriented to person, place, and time.  Skin: Skin is warm and dry.  Psychiatric: She has a normal mood and  affect.  Nursing note and vitals reviewed.    ASSESSMENT and PLAN  1. Degenerative disc disease, cervical - Ambulatory referral to Pain Clinic  2. Chronic pain syndrome - Ambulatory referral to Pain Clinic  3. Fibromyalgia - Ambulatory referral to Pain Clinic  Other orders - pravastatin (PRAVACHOL) 40 MG tablet; Take 1 tablet (40 mg total) by mouth daily.     Rutherford Guys, MD Primary Care at [place], [place] [address] Ph.  [phone] Fax [phone]

## 2018-02-08 NOTE — Patient Instructions (Signed)
° ° ° °  If you have lab work done today you will be contacted with your lab results within the next 2 weeks.  If you have not heard from us then please contact us. The fastest way to get your results is to register for My Chart. ° ° °IF you received an x-ray today, you will receive an invoice from [facility]. Please contact [facility] at [phone] with questions or concerns regarding your invoice.  ° °IF you received labwork today, you will receive an invoice from [facility]. Please contact [facility] at [phone] with questions or concerns regarding your invoice.  ° °Our billing staff will not be able to assist you with questions regarding bills from these companies. ° °You will be contacted with the lab results as soon as they are available. The fastest way to get your results is to activate your My Chart account. Instructions are located on the last page of this paperwork. If you have not heard from us regarding the results in 2 weeks, please contact this office. °  ° ° ° °

## 2018-03-08 ENCOUNTER — Encounter: Payer: Self-pay | Admitting: Urgent Care

## 2018-03-08 ENCOUNTER — Ambulatory Visit (INDEPENDENT_AMBULATORY_CARE_PROVIDER_SITE_OTHER): Payer: 59 | Admitting: Urgent Care

## 2018-03-08 VITALS — BP 133/83 | HR 107 | Temp 98.5°F | Resp 17 | Ht 60.5 in | Wt 177.0 lb

## 2018-03-08 DIAGNOSIS — Z9889 Other specified postprocedural states: Secondary | ICD-10-CM

## 2018-03-08 DIAGNOSIS — G8929 Other chronic pain: Secondary | ICD-10-CM

## 2018-03-08 DIAGNOSIS — G894 Chronic pain syndrome: Secondary | ICD-10-CM

## 2018-03-08 DIAGNOSIS — M5442 Lumbago with sciatica, left side: Secondary | ICD-10-CM

## 2018-03-08 DIAGNOSIS — M5030 Other cervical disc degeneration, unspecified cervical region: Secondary | ICD-10-CM

## 2018-03-08 MED ORDER — METHYLPREDNISOLONE ACETATE 80 MG/ML IJ SUSP
80.0000 mg | Freq: Once | INTRAMUSCULAR | Status: AC
  Administered 2018-03-08: 80 mg via INTRAMUSCULAR

## 2018-03-08 NOTE — Progress Notes (Signed)
    MRN: [id number] DOB: August 10, 1965  Subjective:   [name] is a 52 y.o. female presenting for recurrent severe sharp low back pain with radiation into both posterior lateral thighs, left worse than right.  Patient has a history of degenerative disc disease, bulging disks and is status post low back surgery.  She denies trauma, falls, weakness, incontinence, hematuria.  She was previously seen at the neurosurgery and spine center on [facility].  She has not gone back for recheck.  She was last seen on 0 28 2019 and referred to a chronic pain clinic.  Her appointment is set for October.  However, patient's pain is very severe and states that she cannot work.  [name] has a current medication list which includes the following prescription(s): calcium carbonate-vitamin d, cholecalciferol, gabapentin, ibuprofen, omeprazole, oxycodone, pravastatin, and tizanidine. Also is allergic to other.  [name]  has a past medical history of Acid reflux, Adenomatous colon polyp, Anxiety, Constipation, chronic, Degenerative disc disease, cervical, Depression, Fatty liver disease, nonalcoholic, Fibromyalgia, Hypercholesterolemia, NSVD (normal spontaneous vaginal delivery), Prediabetes, Seizures ([place]), and Vitamin D deficiency. Also  has a past surgical history that includes Breast surgery; Pelvic laparoscopy; Laparoscopic cholecystectomy; Lumbar disc surgery; Back surgery (2008); herniated disc; Tonsillectomy and adenoidectomy; and Vaginal hysterectomy.  Objective:   Vitals: BP 133/83   Pulse (!) 107   Temp 98.5 F (36.9 C) (Oral)   Resp 17   Ht 5' 0.5" (1.537 m)   Wt 177 lb (80.3 kg)   SpO2 98%   BMI 34.00 kg/m   Physical Exam  Constitutional: She is oriented to person, place, and time. She appears well-developed and well-nourished.  Cardiovascular: Normal rate.  Pulmonary/Chest: Effort normal.  Musculoskeletal:       Lumbar back: She exhibits decreased range of motion and tenderness.  She exhibits no swelling, no edema, no deformity, no laceration and no spasm.       Back:  Neurological: She is alert and oriented to person, place, and time. She displays normal reflexes. Coordination (moving very gingerly favoring low back, difficulty standing or sitting erect) abnormal.   Assessment and Plan :   Chronic left-sided low back pain with left-sided sciatica - Plan: methylPREDNISolone acetate (DEPO-MEDROL) injection 80 mg, Ambulatory referral to Neurosurgery  Chronic pain syndrome - Plan: methylPREDNISolone acetate (DEPO-MEDROL) injection 80 mg, Ambulatory referral to Neurosurgery  Degenerative disc disease, cervical - Plan: methylPREDNISolone acetate (DEPO-MEDROL) injection 80 mg, Ambulatory referral to Neurosurgery  History of back surgery - Plan: Ambulatory referral to Neurosurgery  Patient received IM Depo-Medrol in clinic today.  I did provide her with a work note she was demanding short-term disability and FMLA.  I counseled that she needs to come back and follow-up with her PCP, Dr. Pamella Pert.  In the meantime I will refer patient back to the neurosurgeon that performed her surgery years ago.  Return to clinic precautions reviewed.  Jaynee Eagles, PA-C Primary Care at [facility] [phone] 03/08/2018  4:33 PM

## 2018-03-08 NOTE — Patient Instructions (Addendum)
Dolor de espalda en adultos Back Pain, Adult Muchos adultos sufren dolor de espalda de vez en cuando. Las causas comunes del dolor de espalda son las siguientes:  Ardelia Mems distensin del msculo o el ligamento.  Desgaste (degeneracin) de los discos vertebrales.  Artritis.  Un golpe en la espalda.  El dolor de espalda puede ser breve (agudo) o prolongado (crnico). Es posible que se realicen un examen fsico, anlisis de laboratorio u otros estudios de diagnstico por imgenes para Animator causa del Social research officer, government. Siga estas indicaciones en su casa: Control del dolor y de la rigidez  [employer] medicamentos de venta libre y los recetados solamente como se lo haya indicado el mdico.  Si se lo indican, aplique calor en la zona afectada con la frecuencia que le haya indicado el mdico. Use la fuente de calor que el mdico le recomiende, como una compresa de calor hmedo o una almohadilla trmica. ? Colquese una [employer] piel y la fuente de Freight forwarder. ? Aplique el calor durante 20 a 15minutos. ? Retire la fuente de calor si la piel se le pone de color rojo brillante. Esto es muy importante si no puede sentir el dolor, el calor ni el fro. Corre un mayor riesgo de sufrir quemaduras.  Si se lo indican, aplique hielo sobre la zona lesionada: ? Ponga el hielo en una bolsa plstica. ? Coloque una [employer] piel y la bolsa de hielo. ? Deje el hielo durante 35minutos, de 2 a 3veces por da, durante los primeros 2 o 3das. Actividad  No permanezca en la cama. Si hace reposo ms de 1 a 2 das, puede demorar su recuperacin.  Realice caminatas cortas en superficies planas tan pronto como le sea posible. Trate de caminar un poco ms de Publishing copy.  No se siente, conduzca o permanezca de pie en un mismo lugar durante ms de 30 minutos seguidos. Pararse o sentarse durante largos perodos de Radiographer, therapeutic la espalda.  Use tcnicas apropiadas para levantar objetos. Cuando se  inclina y Chief Executive Officer un [place], utilice posiciones que no sobrecarguen tanto la espalda: ? [place]. ? Mantenga la carga cerca del cuerpo. ? No se tuerza.  Haga actividad fsica habitualmente como se lo haya indicado el mdico. Hacer ejercicio ayudar a que su espalda se cure ms rpido. Adems, esto ayuda a prevenir lesiones de espalda al [employer] fuertes y flexibles.  El mdico puede recomendarle que consulte a un fisioterapeuta. Esta persona puede ayudarlo a elaborar un programa de ejercicios seguro. Haga ejercicios como se lo haya indicado el fisioterapeuta. Estilo de vida  Mantenga un peso saludable. El sobrepeso sobrecarga la espalda y hace que resulte difcil tener una buena [place].  Evite actividades o situaciones que lo hagan sentirse ansioso o estresado. Aprenda maneras de manejar la ansiedad y el estrs. Una forma de controlar el estrs es a travs del ejercicio. El estrs y la ansiedad aumentan la tensin muscular y pueden empeorar el dolor de espalda. Instrucciones generales  Duerma sobre un colchn firme en una posicin cmoda. Intente acostarse de costado, con las rodillas ligeramente flexionadas. Si se recuesta [employer], coloque una almohada debajo de las rodillas.  Siga el plan de tratamiento como se lo haya indicado el mdico. Esto puede incluir lo siguiente: ? Terapia cognitiva o conductual. ? Acupuntura o terapia de masajes. ? Yoga o meditacin. Comunquese con un mdico si:  Siente un dolor que no se alivia con reposo o medicamentos.  Siente mucho dolor que se extiende a las piernas o las nalgas.  El dolor no mejora en 2 semanas.  Siente dolor por la noche.  Pierde peso.  Tiene fiebre o siente escalofros. Solicite ayuda de inmediato si:  Tiene nuevos problemas para controlar la vejiga o los intestinos.  Siente debilidad o adormecimiento inusuales en los brazos o en las piernas.  Siente nuseas o vmitos.  Siente dolor  abdominal.  Siente que va a desmayarse. Resumen  Muchos adultos sufren dolor de espalda de vez en cuando. Es posible que se realicen un examen fsico, anlisis de laboratorio u otros estudios de diagnstico por imgenes para Animator causa del Social research officer, government.  Use tcnicas apropiadas para levantar objetos. Cuando se inclina y levanta un [place], utilice posiciones que no sobrecarguen tanto la espalda.  Tome los medicamentos de venta libre o recetados y aplique calor o hielo solamente como se lo haya indicado el mdico. Esta informacin no tiene Marine scientist el consejo del mdico. Asegrese de hacerle al mdico cualquier pregunta que tenga. Document Released: 05/31/2005 Document Revised: 09/20/2016 Document Reviewed: 09/20/2016 Elsevier Interactive Patient Education  [employer].     If you have lab work done today you will be contacted with your lab results within the next 2 weeks.  If you have not heard from [place] then please contact us. The fastest way to get your results is to register for My Chart.   IF you received an x-ray today, you will receive an invoice from [facility]. Please contact [facility] at [phone] with questions or concerns regarding your invoice.   IF you received labwork today, you will receive an invoice from [facility]. Please contact [facility] at [phone] with questions or concerns regarding your invoice.   Our billing staff will not be able to assist you with questions regarding bills from these companies.  You will be contacted with the lab results as soon as they are available. The fastest way to get your results is to activate your My Chart account. Instructions are located on the last page of this paperwork. If you have not heard from [place] regarding the results in 2 weeks, please contact this office.

## 2018-03-09 ENCOUNTER — Encounter: Payer: Self-pay | Admitting: Family Medicine

## 2018-03-09 ENCOUNTER — Encounter: Payer: Self-pay | Admitting: Urgent Care

## 2018-03-10 ENCOUNTER — Encounter: Payer: Self-pay | Admitting: Family Medicine

## 2018-03-10 NOTE — Telephone Encounter (Signed)
FYI. Sorry could not bring up FMLA pool

## 2018-03-14 ENCOUNTER — Ambulatory Visit (INDEPENDENT_AMBULATORY_CARE_PROVIDER_SITE_OTHER): Payer: 59 | Admitting: Family Medicine

## 2018-03-14 ENCOUNTER — Other Ambulatory Visit: Payer: Self-pay

## 2018-03-14 ENCOUNTER — Encounter: Payer: Self-pay | Admitting: Family Medicine

## 2018-03-14 VITALS — BP 132/80 | HR 82 | Temp 98.0°F | Ht 60.0 in | Wt 176.0 lb

## 2018-03-14 DIAGNOSIS — G8929 Other chronic pain: Secondary | ICD-10-CM | POA: Diagnosis not present

## 2018-03-14 DIAGNOSIS — Z9889 Other specified postprocedural states: Secondary | ICD-10-CM

## 2018-03-14 DIAGNOSIS — G894 Chronic pain syndrome: Secondary | ICD-10-CM | POA: Diagnosis not present

## 2018-03-14 DIAGNOSIS — M5442 Lumbago with sciatica, left side: Secondary | ICD-10-CM | POA: Diagnosis not present

## 2018-03-14 DIAGNOSIS — M549 Dorsalgia, unspecified: Secondary | ICD-10-CM | POA: Diagnosis not present

## 2018-03-14 MED ORDER — IBUPROFEN 800 MG PO TABS: 800.0000 mg | ORAL_TABLET | Freq: Three times a day (TID) | ORAL | 0 refills | Status: DC | PRN

## 2018-03-14 MED ORDER — PREGABALIN 75 MG PO CAPS: 75.0000 mg | ORAL_CAPSULE | Freq: Two times a day (BID) | ORAL | 2 refills | Status: DC

## 2018-03-14 NOTE — Patient Instructions (Signed)
° ° ° °  If you have lab work done today you will be contacted with your lab results within the next 2 weeks.  If you have not heard from us then please contact us. The fastest way to get your results is to register for My Chart. ° ° °IF you received an x-ray today, you will receive an invoice from [facility]. Please contact [facility] at [phone] with questions or concerns regarding your invoice.  ° °IF you received labwork today, you will receive an invoice from [facility]. Please contact [facility] at [phone] with questions or concerns regarding your invoice.  ° °Our billing staff will not be able to assist you with questions regarding bills from these companies. ° °You will be contacted with the lab results as soon as they are available. The fastest way to get your results is to activate your My Chart account. Instructions are located on the last page of this paperwork. If you have not heard from us regarding the results in 2 weeks, please contact this office. °  ° ° ° °

## 2018-03-14 NOTE — Progress Notes (Signed)
10/1/20198:49 AM  [name] 1966/04/28, 52 y.o. female [id number]  Chief Complaint  Patient presents with  . Back Pain    foolow up of ddd, bulging dicks and post back surgery. taking oxycodone for the pain    HPI:   Patient is a 52 y.o. female with past medical history significant for DDD, s/p back surgery x3 who, prediabetes, fatty liver presents today for chronic low back pain  Seen on 03/08/18 - given steroid injection, referred to neurosurg Sees pain mgt - Dr Mechele Dawley on Oct 18th Has appt with neurosurg but patient not sure if of benefit given 3 surgeries already Given a week off from work but unable to use short term disability until 10 days She has been unable to return to work, works in Proofreader, preparing orders, physical labor Chronic low back pain that radiates down her thighs, sharp, electric Worse when she walks or stands for any sign period of time Has not been able to sleep due to pain Has been having pain and numbness of mid back, legs all the way down to feet, also going up her mid back and neck Denies any changes to bowel or bladder, denies any focal weakness No falls or tripping Has done epidural injections, PT and acupuncture in the past Unable to tolerate higher dose of gabapentin, taking 200mg  at bedtime Has never tried lyrica, cymbalta, amytriptyline Taking percocet, tizanadine, gabapentin  Fall Risk  03/14/2018 11/29/2017 10/27/2017 08/18/2017  Falls in the past year? No No No No     Depression screen [facility] 2/9 03/14/2018 03/08/2018 11/29/2017  Decreased Interest 0 2 0  Down, Depressed, Hopeless 0 2 0  PHQ - 2 Score 0 4 0  Altered sleeping - 2 -  Tired, decreased energy - 2 -  Change in appetite - 0 -  Feeling bad or failure about yourself  - 2 -  Trouble concentrating - 0 -  Moving slowly or fidgety/restless - 2 -  Suicidal thoughts - 0 -  PHQ-9 Score - 12 -  Difficult doing work/chores - Somewhat difficult -    Allergies  Allergen  Reactions  . Latex Swelling  . Other     LATEX, kiwi, papaya, fish    Prior to Admission medications   Medication Sig Start Date End Date Taking? Authorizing Provider  Calcium Carbonate-Vitamin D (CALTRATE 600+D PO) Take by mouth.   Yes [provider]  cholecalciferol (VITAMIN D) 1000 UNITS tablet Take 1,000 Units by mouth 2 (two) times daily.     Yes [provider]  gabapentin (NEURONTIN) 100 MG capsule Take 2 capsules (200 mg total) by mouth 2 (two) times daily. 02/08/18  Yes Rutherford Guys, MD  ibuprofen (ADVIL,MOTRIN) 800 MG tablet Take 1 tablet (800 mg total) by mouth every 8 (eight) hours as needed. 02/08/18  Yes Rutherford Guys, MD  omeprazole (PRILOSEC) 40 MG capsule Take 1 capsule (40 mg total) by mouth daily. 05/20/11  Yes Esterwood, Amy S, PA-C  oxyCODONE (OXY IR/ROXICODONE) 5 MG immediate release tablet Take 1 tablet (5 mg total) by mouth every 6 (six) hours as needed for severe pain. 02/08/18  Yes Rutherford Guys, MD  pravastatin (PRAVACHOL) 40 MG tablet Take 1 tablet (40 mg total) by mouth daily. 02/08/18  Yes Rutherford Guys, MD  tizanidine (ZANAFLEX) 2 MG capsule Take 2 mg by mouth 3 (three) times daily.   Yes [provider]    Past Medical History:  Diagnosis Date  . Acid  reflux   . Adenomatous colon polyp    2012  . Anxiety   . Constipation, chronic   . Degenerative disc disease, cervical    s/p epidural injections  . Depression   . Fatty liver disease, nonalcoholic    per ultrasound  . Fibromyalgia   . Hypercholesterolemia   . NSVD (normal spontaneous vaginal delivery)    X2  . Prediabetes    a1c 6.1  . Seizures ([place])    with dental procedure  . Vitamin D deficiency     Past Surgical History:  Procedure Laterality Date  . BACK SURGERY  2008  . BREAST SURGERY     REDUCTIVE MAMMOPLASTY  . herniated disc    . LAPAROSCOPIC CHOLECYSTECTOMY    . LUMBAR DISC SURGERY     L-4 AND L-5   . PELVIC LAPAROSCOPY     BTSP  .  TONSILLECTOMY AND ADENOIDECTOMY    . VAGINAL HYSTERECTOMY     Post Repair/TVT Sling    Social History   Tobacco Use  . Smoking status: Never Smoker  . Smokeless tobacco: Never Used  Substance Use Topics  . Alcohol use: No    Family History  Problem Relation Age of Onset  . Hypertension Sister   . Cancer Sister        colon ca... stepsister  . Colon cancer Sister     ROS Per hpi  OBJECTIVE:  Blood pressure 132/80, pulse 82, temperature 98 F (36.7 C), temperature source Oral, height 5' (1.524 m), weight 176 lb (79.8 kg), SpO2 96 %. Body mass index is 34.37 kg/m.   Physical Exam  Constitutional: She is oriented to person, place, and time. She appears well-developed and well-nourished.  HENT:  Head: Normocephalic and atraumatic.  Mouth/Throat: Mucous membranes are normal.  Eyes: Pupils are equal, round, and reactive to light. Conjunctivae and EOM are normal. No scleral icterus.  Neck: Neck supple.  Pulmonary/Chest: Effort normal.  Musculoskeletal:       Cervical back: She exhibits decreased range of motion, tenderness, bony tenderness and spasm.       Thoracic back: She exhibits tenderness and spasm. She exhibits normal range of motion.       Lumbar back: She exhibits decreased range of motion, tenderness, bony tenderness and spasm.  + SLR L > R BLE reflexes +2 equal and symmetrical 4/5 strength of hip flexors 5/5 knee extension, flexion, ankle dorsiflexion and plantarflexion  Neurological: She is alert and oriented to person, place, and time.  Gait slow, in obvious discomfort  Skin: Skin is warm and dry.  Psychiatric: She has a normal mood and affect.  Nursing note and vitals reviewed.    ASSESSMENT and PLAN  1. Chronic left-sided low back pain with left-sided sciatica 2. Back pain with history of spinal surgery 3. Chronic pain syndrome Adding ibuprofen, changing gabapentin to lyrica, keep upcoming appt with pain and neurosurg. Work excuse given until our  next appt.RTC precautions given Other orders - ibuprofen (ADVIL,MOTRIN) 800 MG tablet; Take 1 tablet (800 mg total) by mouth every 8 (eight) hours as needed. - pregabalin (LYRICA) 75 MG capsule; Take 1 capsule (75 mg total) by mouth 2 (two) times daily.  Return in about 3 weeks (around 04/04/2018).    Rutherford Guys, MD Primary Care at [place], [place] [address] Ph.  [phone] Fax [phone]

## 2018-03-21 ENCOUNTER — Telehealth: Payer: Self-pay | Admitting: Family Medicine

## 2018-03-21 NOTE — Telephone Encounter (Signed)
Patient needs her FMLA forms completed for her lower back pain. I have completed the forms based off the OV notes and they just need to be signed I will place the forms in Dr Ardyth Gal box on 03/22/18 please sign the forms and return them to the FMLA/Disability box at the 102 checkout desk within 5-7 business days. Thank you!

## 2018-03-22 DIAGNOSIS — Z0271 Encounter for disability determination: Secondary | ICD-10-CM

## 2018-03-23 ENCOUNTER — Encounter: Payer: Self-pay | Admitting: Family Medicine

## 2018-03-23 DIAGNOSIS — M542 Cervicalgia: Secondary | ICD-10-CM | POA: Diagnosis not present

## 2018-03-23 DIAGNOSIS — M5440 Lumbago with sciatica, unspecified side: Secondary | ICD-10-CM | POA: Diagnosis not present

## 2018-03-23 NOTE — Telephone Encounter (Signed)
Paperwork scanned and fax on 10/10

## 2018-03-24 ENCOUNTER — Other Ambulatory Visit: Payer: Self-pay | Admitting: Neurosurgery

## 2018-03-24 DIAGNOSIS — M542 Cervicalgia: Secondary | ICD-10-CM

## 2018-03-24 DIAGNOSIS — M5440 Lumbago with sciatica, unspecified side: Secondary | ICD-10-CM

## 2018-03-28 ENCOUNTER — Ambulatory Visit (INDEPENDENT_AMBULATORY_CARE_PROVIDER_SITE_OTHER): Payer: 59 | Admitting: Family Medicine

## 2018-03-28 ENCOUNTER — Encounter: Payer: Self-pay | Admitting: Family Medicine

## 2018-03-28 ENCOUNTER — Other Ambulatory Visit: Payer: Self-pay

## 2018-03-28 VITALS — BP 123/82 | HR 77 | Temp 97.7°F | Ht 60.0 in | Wt 177.4 lb

## 2018-03-28 DIAGNOSIS — Z9889 Other specified postprocedural states: Secondary | ICD-10-CM

## 2018-03-28 DIAGNOSIS — M5030 Other cervical disc degeneration, unspecified cervical region: Secondary | ICD-10-CM | POA: Diagnosis not present

## 2018-03-28 DIAGNOSIS — G8929 Other chronic pain: Secondary | ICD-10-CM

## 2018-03-28 DIAGNOSIS — M5442 Lumbago with sciatica, left side: Secondary | ICD-10-CM

## 2018-03-28 NOTE — Progress Notes (Signed)
10/15/201912:48 PM  [name] [name] 1965-12-20, 52 y.o. female [id number]  Chief Complaint  Patient presents with  . Back Pain    Pain has gotten alot better, has appt with pain management this Friday    HPI:   Patient is a 52 y.o. female with past medical history significant for DDD, s/p back surgery x3 who, prediabetes, fatty liver presents today for chronic low back and neck pain  Doing much better on lyrica, helping with sciatica pain Low back and neck pain still present Still not able to sit or stand for long periods of time Having some mild headaches and dizziness Sees pain mgt on Friday Will be getting mri on Sunday of neck and lumbar spine, ordered by dr cram, neurosurgeon, still does not have an appointment Still not working Works as TEFL teacher for [employer] There is no light duty Needs to be able to bend and lift boxes upto 75lbs Needs to be able to stand for long periods of time Needs to walk long distances    Fall Risk  03/28/2018 03/14/2018 11/29/2017 10/27/2017 08/18/2017  Falls in the past year? No No No No No     Depression screen [facility] 2/9 03/28/2018 03/14/2018 03/08/2018  Decreased Interest 0 0 2  Down, Depressed, Hopeless - 0 2  PHQ - 2 Score 0 0 4  Altered sleeping 0 - 2  Tired, decreased energy - - 2  Change in appetite - - 0  Feeling bad or failure about yourself  - - 2  Trouble concentrating - - 0  Moving slowly or fidgety/restless - - 2  Suicidal thoughts - - 0  PHQ-9 Score 0 - 12  Difficult doing work/chores - - Somewhat difficult    Allergies  Allergen Reactions  . Latex Swelling  . Other     LATEX, kiwi, papaya, fish    Prior to Admission medications   Medication Sig Start Date End Date Taking? Authorizing Provider  Calcium Carbonate-Vitamin D (CALTRATE 600+D PO) Take by mouth.   Yes [provider]  cholecalciferol (VITAMIN D) 1000 UNITS tablet Take 1,000 Units by mouth 2 (two) times daily.     Yes [provider]  ibuprofen (ADVIL,MOTRIN) 800 MG tablet Take 1 tablet (800 mg total) by mouth every 8 (eight) hours as needed. 03/14/18  Yes Rutherford Guys, MD  omeprazole (PRILOSEC) 40 MG capsule Take 1 capsule (40 mg total) by mouth daily. 05/20/11  Yes Esterwood, Amy S, PA-C  oxyCODONE (OXY IR/ROXICODONE) 5 MG immediate release tablet Take 1 tablet (5 mg total) by mouth every 6 (six) hours as needed for severe pain. 02/08/18  Yes Rutherford Guys, MD  pravastatin (PRAVACHOL) 40 MG tablet Take 1 tablet (40 mg total) by mouth daily. 02/08/18  Yes Rutherford Guys, MD  pregabalin (LYRICA) 75 MG capsule Take 1 capsule (75 mg total) by mouth 2 (two) times daily. 03/14/18  Yes Rutherford Guys, MD  tizanidine (ZANAFLEX) 2 MG capsule Take 2 mg by mouth 3 (three) times daily.   Yes [provider]    Past Medical History:  Diagnosis Date  . Acid reflux   . Adenomatous colon polyp    2012  . Anxiety   . Constipation, chronic   . Degenerative disc disease, cervical    s/p epidural injections  . Depression   . Fatty liver disease, nonalcoholic    per ultrasound  . Fibromyalgia   . Hypercholesterolemia   . NSVD (normal spontaneous  vaginal delivery)    X2  . Prediabetes    a1c 6.1  . Seizures ([place])    with dental procedure  . Vitamin D deficiency     Past Surgical History:  Procedure Laterality Date  . BACK SURGERY  2008  . BREAST SURGERY     REDUCTIVE MAMMOPLASTY  . herniated disc    . LAPAROSCOPIC CHOLECYSTECTOMY    . LUMBAR DISC SURGERY     L-4 AND L-5   . PELVIC LAPAROSCOPY     BTSP  . TONSILLECTOMY AND ADENOIDECTOMY    . VAGINAL HYSTERECTOMY     Post Repair/TVT Sling    Social History   Tobacco Use  . Smoking status: Never Smoker  . Smokeless tobacco: Never Used  Substance Use Topics  . Alcohol use: No    Family History  Problem Relation Age of Onset  . Hypertension Sister   . Cancer Sister        colon ca... stepsister  . Colon cancer Sister      ROS   OBJECTIVE:  Blood pressure 123/82, pulse 77, temperature 97.7 F (36.5 C), temperature source Oral, height 5' (1.524 m), weight 177 lb 6.4 oz (80.5 kg), SpO2 96 %. Body mass index is 34.65 kg/m.   Physical Exam  Constitutional: She is oriented to person, place, and time. She appears well-developed and well-nourished.  HENT:  Head: Normocephalic and atraumatic.  Mouth/Throat: Mucous membranes are normal.  Eyes: Pupils are equal, round, and reactive to light. Conjunctivae and EOM are normal. No scleral icterus.  Neck: Neck supple.  Pulmonary/Chest: Effort normal.  Musculoskeletal:       Cervical back: She exhibits tenderness, bony tenderness, swelling and spasm. She exhibits normal range of motion.       Lumbar back: She exhibits tenderness, bony tenderness, swelling and spasm. She exhibits normal range of motion.  Neurological: She is alert and oriented to person, place, and time. She has normal strength and normal reflexes. Gait normal.  + SLR left  Skin: Skin is warm and dry.  Psychiatric: She has a normal mood and affect.  Nursing note and vitals reviewed.   ASSESSMENT and PLAN  1. Chronic left-sided low back pain with left-sided sciatica 2. Degenerative disc disease, cervical 3. History of back surgery  Better symptom wise but not controlled. Has upcoming appt with pain mgt and for mri. Needs to make appt with neurosurg. Having mild side effects with lyrica, unable to increase at this time. Will see how she does with pain mgt and neurosurg. She is hoping for epidural injections. She remains unable to fulfill her work duties at this time, release extended for next 2 weeks.  Return in about 1 week (around 04/04/2018) for chronic pain.    Rutherford Guys, MD Primary Care at [place] [place], [place] [address] Ph.  [phone] Fax [phone]

## 2018-03-28 NOTE — Patient Instructions (Signed)
° ° ° °  If you have lab work done today you will be contacted with your lab results within the next 2 weeks.  If you have not heard from us then please contact us. The fastest way to get your results is to register for My Chart. ° ° °IF you received an x-ray today, you will receive an invoice from [facility]. Please contact [facility] at [phone] with questions or concerns regarding your invoice.  ° °IF you received labwork today, you will receive an invoice from [facility]. Please contact [facility] at [phone] with questions or concerns regarding your invoice.  ° °Our billing staff will not be able to assist you with questions regarding bills from these companies. ° °You will be contacted with the lab results as soon as they are available. The fastest way to get your results is to activate your My Chart account. Instructions are located on the last page of this paperwork. If you have not heard from us regarding the results in 2 weeks, please contact this office. °  ° ° ° °

## 2018-03-31 DIAGNOSIS — M797 Fibromyalgia: Secondary | ICD-10-CM | POA: Diagnosis not present

## 2018-03-31 DIAGNOSIS — Z79899 Other long term (current) drug therapy: Secondary | ICD-10-CM | POA: Diagnosis not present

## 2018-03-31 DIAGNOSIS — Z5181 Encounter for therapeutic drug level monitoring: Secondary | ICD-10-CM | POA: Diagnosis not present

## 2018-03-31 DIAGNOSIS — M5412 Radiculopathy, cervical region: Secondary | ICD-10-CM | POA: Diagnosis not present

## 2018-03-31 DIAGNOSIS — M533 Sacrococcygeal disorders, not elsewhere classified: Secondary | ICD-10-CM | POA: Diagnosis not present

## 2018-04-02 ENCOUNTER — Ambulatory Visit
Admission: RE | Admit: 2018-04-02 | Discharge: 2018-04-02 | Disposition: A | Discharge status: Home / Self Care | Payer: 59 | Source: Ambulatory Visit | Attending: Neurosurgery | Admitting: Neurosurgery

## 2018-04-02 DIAGNOSIS — M5440 Lumbago with sciatica, unspecified side: Secondary | ICD-10-CM

## 2018-04-02 DIAGNOSIS — M542 Cervicalgia: Secondary | ICD-10-CM

## 2018-04-02 DIAGNOSIS — M47812 Spondylosis without myelopathy or radiculopathy, cervical region: Secondary | ICD-10-CM | POA: Diagnosis not present

## 2018-04-02 DIAGNOSIS — M50223 Other cervical disc displacement at C6-C7 level: Secondary | ICD-10-CM | POA: Diagnosis not present

## 2018-04-02 DIAGNOSIS — M5416 Radiculopathy, lumbar region: Secondary | ICD-10-CM | POA: Diagnosis not present

## 2018-04-02 DIAGNOSIS — M50222 Other cervical disc displacement at C5-C6 level: Secondary | ICD-10-CM | POA: Diagnosis not present

## 2018-04-03 ENCOUNTER — Other Ambulatory Visit: Payer: Self-pay | Admitting: Neurosurgery

## 2018-04-03 DIAGNOSIS — M5440 Lumbago with sciatica, unspecified side: Secondary | ICD-10-CM

## 2018-04-03 DIAGNOSIS — M542 Cervicalgia: Secondary | ICD-10-CM

## 2018-04-04 ENCOUNTER — Encounter: Payer: Self-pay | Admitting: Family Medicine

## 2018-04-04 ENCOUNTER — Other Ambulatory Visit: Payer: Self-pay

## 2018-04-04 ENCOUNTER — Ambulatory Visit (INDEPENDENT_AMBULATORY_CARE_PROVIDER_SITE_OTHER): Payer: 59 | Admitting: Family Medicine

## 2018-04-04 VITALS — BP 137/87 | HR 93 | Temp 97.9°F | Ht 60.0 in | Wt 177.0 lb

## 2018-04-04 DIAGNOSIS — M5010 Cervical disc disorder with radiculopathy, unspecified cervical region: Secondary | ICD-10-CM | POA: Diagnosis not present

## 2018-04-04 DIAGNOSIS — M542 Cervicalgia: Secondary | ICD-10-CM | POA: Diagnosis not present

## 2018-04-04 DIAGNOSIS — M5416 Radiculopathy, lumbar region: Secondary | ICD-10-CM | POA: Diagnosis not present

## 2018-04-04 DIAGNOSIS — M5126 Other intervertebral disc displacement, lumbar region: Secondary | ICD-10-CM | POA: Diagnosis not present

## 2018-04-04 DIAGNOSIS — M5442 Lumbago with sciatica, left side: Secondary | ICD-10-CM | POA: Diagnosis not present

## 2018-04-04 DIAGNOSIS — Z9889 Other specified postprocedural states: Secondary | ICD-10-CM | POA: Diagnosis not present

## 2018-04-04 DIAGNOSIS — G894 Chronic pain syndrome: Secondary | ICD-10-CM | POA: Diagnosis not present

## 2018-04-04 NOTE — Patient Instructions (Signed)
° ° ° °  If you have lab work done today you will be contacted with your lab results within the next 2 weeks.  If you have not heard from us then please contact us. The fastest way to get your results is to register for My Chart. ° ° °IF you received an x-ray today, you will receive an invoice from [facility]. Please contact [facility] at [phone] with questions or concerns regarding your invoice.  ° °IF you received labwork today, you will receive an invoice from [facility]. Please contact [facility] at [phone] with questions or concerns regarding your invoice.  ° °Our billing staff will not be able to assist you with questions regarding bills from these companies. ° °You will be contacted with the lab results as soon as they are available. The fastest way to get your results is to activate your My Chart account. Instructions are located on the last page of this paperwork. If you have not heard from us regarding the results in 2 weeks, please contact this office. °  ° ° ° °

## 2018-04-04 NOTE — Progress Notes (Signed)
10/22/20195:10 PM  [name] Aug 05, 1965, 52 y.o. female [id number]  Chief Complaint  Patient presents with  . Pain    follow up on MRI for back pain, will be starting PT soon  . Anxiety    due to the report given on the MRI, wants medication that will help her relax    HPI:   Patient is a 52 y.o. female with past medical history significant for fibromyalgia, cervical radiculopathy, SI dysfunction, lumbosacral radiculopathy,  Pre-diabetes, fatty liver who presents today for routine followup  pmp reviewed,  Saw Dr Lyda Kalata - pain mgt, did caudal/lumbar epidural injection today Saw Dr Saintclair Halsted - neurosurg MRI of lumbar and cervical spine done New left subarticular disc protrusion with superimposed extruded disc fragment extending into left foramen encroaching on both LEFT L2 and L3 Worsening moderately severe left foraminal narrowing on C5-C6  Plan is PT and epidural injections  Considering surgery, s/p surgery x 3  sees Dr Lyda Kalata in Dec Sees Dr Saintclair Halsted after he completes PT  Currently on FMLA Short term disability has been approved but she has not received any reimbursements yet  Fall Risk  04/04/2018 03/28/2018 03/14/2018 11/29/2017 10/27/2017  Falls in the past year? No No No No No     Depression screen [facility] 2/9 04/04/2018 03/28/2018 03/14/2018  Decreased Interest 0 0 0  Down, Depressed, Hopeless 0 - 0  PHQ - 2 Score 0 0 0  Altered sleeping - 0 -  Tired, decreased energy - - -  Change in appetite - - -  Feeling bad or failure about yourself  - - -  Trouble concentrating - - -  Moving slowly or fidgety/restless - - -  Suicidal thoughts - - -  PHQ-9 Score - 0 -  Difficult doing work/chores - - -    Allergies  Allergen Reactions  . Latex Swelling  . Other     LATEX, kiwi, papaya, fish    Prior to Admission medications   Medication Sig Start Date End Date Taking? Authorizing Provider  Calcium Carbonate-Vitamin D (CALTRATE 600+D PO) Take by mouth.    Yes [provider]  cholecalciferol (VITAMIN D) 1000 UNITS tablet Take 1,000 Units by mouth 2 (two) times daily.     Yes [provider]  ibuprofen (ADVIL,MOTRIN) 800 MG tablet Take 1 tablet (800 mg total) by mouth every 8 (eight) hours as needed. 03/14/18  Yes Rutherford Guys, MD  omeprazole (PRILOSEC) 40 MG capsule Take 1 capsule (40 mg total) by mouth daily. 05/20/11  Yes Esterwood, Amy S, PA-C  oxyCODONE (OXY IR/ROXICODONE) 5 MG immediate release tablet Take 1 tablet (5 mg total) by mouth every 6 (six) hours as needed for severe pain. 02/08/18  Yes Rutherford Guys, MD  pravastatin (PRAVACHOL) 40 MG tablet Take 1 tablet (40 mg total) by mouth daily. 02/08/18  Yes Rutherford Guys, MD  pregabalin (LYRICA) 75 MG capsule Take 1 capsule (75 mg total) by mouth 2 (two) times daily. 03/14/18  Yes Rutherford Guys, MD  tizanidine (ZANAFLEX) 2 MG capsule Take 2 mg by mouth 3 (three) times daily.   Yes [provider]    Past Medical History:  Diagnosis Date  . Acid reflux   . Adenomatous colon polyp    2012  . Anxiety   . Constipation, chronic   . Degenerative disc disease, cervical    s/p epidural injections  . Depression   . Fatty liver disease, nonalcoholic    per ultrasound  .  Fibromyalgia   . Hypercholesterolemia   . NSVD (normal spontaneous vaginal delivery)    X2  . Prediabetes    a1c 6.1  . Seizures ([place])    with dental procedure  . Vitamin D deficiency     Past Surgical History:  Procedure Laterality Date  . BACK SURGERY  2008  . BREAST SURGERY     REDUCTIVE MAMMOPLASTY  . herniated disc    . LAPAROSCOPIC CHOLECYSTECTOMY    . LUMBAR DISC SURGERY     L-4 AND L-5   . PELVIC LAPAROSCOPY     BTSP  . TONSILLECTOMY AND ADENOIDECTOMY    . VAGINAL HYSTERECTOMY     Post Repair/TVT Sling    Social History   Tobacco Use  . Smoking status: Never Smoker  . Smokeless tobacco: Never Used  Substance Use Topics  . Alcohol use: No    Family History    Problem Relation Age of Onset  . Hypertension Sister   . Cancer Sister        colon ca... stepsister  . Colon cancer Sister     ROS Per hpi  OBJECTIVE:  Blood pressure 137/87, pulse 93, temperature 97.9 F (36.6 C), temperature source Oral, height 5' (1.524 m), weight 177 lb (80.3 kg), SpO2 96 %. Body mass index is 34.57 kg/m.   Physical Exam  Constitutional: She is oriented to person, place, and time. She appears well-developed and well-nourished.  HENT:  Head: Normocephalic and atraumatic.  Mouth/Throat: Mucous membranes are normal.  Eyes: Pupils are equal, round, and reactive to light. Conjunctivae and EOM are normal. No scleral icterus.  Neck: Neck supple.  Pulmonary/Chest: Effort normal.  Neurological: She is alert and oriented to person, place, and time.  Skin: Skin is warm and dry.  Psychiatric: She has a normal mood and affect.  Nursing note and vitals reviewed.    ASSESSMENT and PLAN 1. Lumbar radiculopathy 2. Cervical disc disorder with radiculopathy of cervical region 3. History of back surgery Uncontrolled. Just had epidural today. Consider increasing lyrica. Work excuse given for 4 weeks, allowing time to work on [employer] records from specialists requested.   Return in about 4 weeks (around 05/02/2018).    Rutherford Guys, MD Primary Care at [place], [place] [address] Ph.  [phone] Fax [phone]

## 2018-04-05 ENCOUNTER — Ambulatory Visit: Payer: 59 | Admitting: Family Medicine

## 2018-04-06 ENCOUNTER — Other Ambulatory Visit: Payer: Self-pay | Admitting: Neurosurgery

## 2018-04-06 DIAGNOSIS — M5126 Other intervertebral disc displacement, lumbar region: Secondary | ICD-10-CM

## 2018-04-10 DIAGNOSIS — Z0271 Encounter for disability determination: Secondary | ICD-10-CM

## 2018-04-17 ENCOUNTER — Encounter: Payer: Self-pay | Admitting: Family Medicine

## 2018-04-25 DIAGNOSIS — M545 Low back pain: Secondary | ICD-10-CM | POA: Diagnosis not present

## 2018-04-25 DIAGNOSIS — M542 Cervicalgia: Secondary | ICD-10-CM | POA: Diagnosis not present

## 2018-04-25 DIAGNOSIS — M79602 Pain in left arm: Secondary | ICD-10-CM | POA: Diagnosis not present

## 2018-04-27 DIAGNOSIS — M79602 Pain in left arm: Secondary | ICD-10-CM | POA: Diagnosis not present

## 2018-04-27 DIAGNOSIS — M545 Low back pain: Secondary | ICD-10-CM | POA: Diagnosis not present

## 2018-04-27 DIAGNOSIS — M542 Cervicalgia: Secondary | ICD-10-CM | POA: Diagnosis not present

## 2018-05-02 ENCOUNTER — Encounter: Payer: Self-pay | Admitting: Family Medicine

## 2018-05-02 ENCOUNTER — Other Ambulatory Visit: Payer: Self-pay

## 2018-05-02 ENCOUNTER — Ambulatory Visit (INDEPENDENT_AMBULATORY_CARE_PROVIDER_SITE_OTHER): Payer: 59 | Admitting: Family Medicine

## 2018-05-02 VITALS — BP 129/84 | HR 95 | Temp 98.5°F | Ht 60.0 in | Wt 180.2 lb

## 2018-05-02 DIAGNOSIS — M5416 Radiculopathy, lumbar region: Secondary | ICD-10-CM | POA: Diagnosis not present

## 2018-05-02 DIAGNOSIS — M5010 Cervical disc disorder with radiculopathy, unspecified cervical region: Secondary | ICD-10-CM

## 2018-05-02 DIAGNOSIS — Z9889 Other specified postprocedural states: Secondary | ICD-10-CM | POA: Diagnosis not present

## 2018-05-02 DIAGNOSIS — M797 Fibromyalgia: Secondary | ICD-10-CM

## 2018-05-02 DIAGNOSIS — Z789 Other specified health status: Secondary | ICD-10-CM

## 2018-05-02 MED ORDER — PREGABALIN 150 MG PO CAPS: 150.0000 mg | ORAL_CAPSULE | Freq: Two times a day (BID) | ORAL | 3 refills | Status: DC

## 2018-05-02 MED ORDER — BATH/SHOWER SEAT MISC: 0 refills | Status: DC

## 2018-05-02 NOTE — Patient Instructions (Signed)
° ° ° °  If you have lab work done today you will be contacted with your lab results within the next 2 weeks.  If you have not heard from us then please contact us. The fastest way to get your results is to register for My Chart. ° ° °IF you received an x-ray today, you will receive an invoice from [facility]. Please contact [facility] at [phone] with questions or concerns regarding your invoice.  ° °IF you received labwork today, you will receive an invoice from [facility]. Please contact [facility] at [phone] with questions or concerns regarding your invoice.  ° °Our billing staff will not be able to assist you with questions regarding bills from these companies. ° °You will be contacted with the lab results as soon as they are available. The fastest way to get your results is to activate your My Chart account. Instructions are located on the last page of this paperwork. If you have not heard from us regarding the results in 2 weeks, please contact this office. °  ° ° ° °

## 2018-05-02 NOTE — Progress Notes (Signed)
11/19/20193:17 PM  [name] [name] 09-15-1965, 52 y.o. female [id number]  Chief Complaint  Patient presents with  . Follow-up    still having back and neck pain. Pain scale is an 8 today. Has no releif for the pain. Went to PT last week , having more pain than help    HPI:   Patient is a 52 y.o. female with past medical history significant for fibromyalgia, cervical radiculopathy, SI dysfunction, lumbosacral radiculopathy,  Pre-diabetes, fatty liver who presents today for routine followup  pmp reviewed Oxycodone 5mg  by Dr Nori Riis Overall unchanged patient Continues to have shooting pain down LEFT leg Today started having shooting pain down left arm with swelling of hand Pain across low back and buttocks, radiates to hip Reports left leg gives out often Denies any changes in bowel or bladder function Unable to bend over, reach overhead, squat Sees neuro surg on Dec 5th Sees pain mgt Dec 3rd Started PT last week, at [facility] physical therapy Short term disability approved upto Dec 8th  Has not been able to cook or care for household Husband doing most of home care Cant shower properly, has difficulty washing lower part of her body as she cant bend over Still drives  Fall Risk  09/38/1829 04/04/2018 03/28/2018 03/14/2018 11/29/2017  Falls in the past year? 0 No No No No     Depression screen [facility] 2/9 04/04/2018 03/28/2018 03/14/2018  Decreased Interest 0 0 0  Down, Depressed, Hopeless 0 - 0  PHQ - 2 Score 0 0 0  Altered sleeping - 0 -  Tired, decreased energy - - -  Change in appetite - - -  Feeling bad or failure about yourself  - - -  Trouble concentrating - - -  Moving slowly or fidgety/restless - - -  Suicidal thoughts - - -  PHQ-9 Score - 0 -  Difficult doing work/chores - - -    Allergies  Allergen Reactions  . Latex Swelling  . Other     LATEX, kiwi, papaya, fish    Prior to Admission medications   Medication Sig Start Date End Date Taking?  Authorizing Provider  Calcium Carbonate-Vitamin D (CALTRATE 600+D PO) Take by mouth.   Yes [provider]  cholecalciferol (VITAMIN D) 1000 UNITS tablet Take 1,000 Units by mouth 2 (two) times daily.     Yes [provider]  ibuprofen (ADVIL,MOTRIN) 800 MG tablet Take 1 tablet (800 mg total) by mouth every 8 (eight) hours as needed. 03/14/18  Yes Rutherford Guys, MD  omeprazole (PRILOSEC) 40 MG capsule Take 1 capsule (40 mg total) by mouth daily. 05/20/11  Yes Esterwood, Amy S, PA-C  oxyCODONE (OXY IR/ROXICODONE) 5 MG immediate release tablet Take 1 tablet (5 mg total) by mouth every 6 (six) hours as needed for severe pain. 02/08/18  Yes Rutherford Guys, MD  pravastatin (PRAVACHOL) 40 MG tablet Take 1 tablet (40 mg total) by mouth daily. 02/08/18  Yes Rutherford Guys, MD  pregabalin (LYRICA) 75 MG capsule Take 1 capsule (75 mg total) by mouth 2 (two) times daily. 03/14/18  Yes Rutherford Guys, MD  tizanidine (ZANAFLEX) 2 MG capsule Take 2 mg by mouth 3 (three) times daily.   Yes [provider]    Past Medical History:  Diagnosis Date  . Acid reflux   . Adenomatous colon polyp    2012  . Anxiety   . Constipation, chronic   . Degenerative disc disease, cervical    s/p  epidural injections  . Depression   . Fatty liver disease, nonalcoholic    per ultrasound  . Fibromyalgia   . Hypercholesterolemia   . NSVD (normal spontaneous vaginal delivery)    X2  . Prediabetes    a1c 6.1  . Seizures ([place])    with dental procedure  . Vitamin D deficiency     Past Surgical History:  Procedure Laterality Date  . BACK SURGERY  2008  . BREAST SURGERY     REDUCTIVE MAMMOPLASTY  . herniated disc    . LAPAROSCOPIC CHOLECYSTECTOMY    . LUMBAR DISC SURGERY     L-4 AND L-5   . PELVIC LAPAROSCOPY     BTSP  . TONSILLECTOMY AND ADENOIDECTOMY    . VAGINAL HYSTERECTOMY     Post Repair/TVT Sling    Social History   Tobacco Use  . Smoking status: Never Smoker  .  Smokeless tobacco: Never Used  Substance Use Topics  . Alcohol use: No    Family History  Problem Relation Age of Onset  . Hypertension Sister   . Cancer Sister        colon ca... stepsister  . Colon cancer Sister     ROS Per hpi  OBJECTIVE:  Blood pressure 129/84, pulse 95, temperature 98.5 F (36.9 C), temperature source Oral, height 5' (1.524 m), weight 180 lb 3.2 oz (81.7 kg), SpO2 97 %. Body mass index is 35.19 kg/m.   Physical Exam  Constitutional: She is oriented to person, place, and time. She appears well-developed and well-nourished.  HENT:  Head: Normocephalic and atraumatic.  Mouth/Throat: Mucous membranes are normal.  Eyes: Pupils are equal, round, and reactive to light. Conjunctivae and EOM are normal. No scleral icterus.  Neck: Neck supple.  Pulmonary/Chest: Effort normal.  Musculoskeletal:       Cervical back: She exhibits tenderness, bony tenderness and spasm. She exhibits normal range of motion.       Thoracic back: She exhibits tenderness. She exhibits normal range of motion and no bony tenderness.       Lumbar back: She exhibits decreased range of motion, tenderness, bony tenderness and spasm.  Neurological: She is alert and oriented to person, place, and time. She has normal reflexes.  Normal strength + SLR on LEFT  Skin: Skin is warm and dry.  Psychiatric: She has a normal mood and affect.  Nursing note and vitals reviewed.    ASSESSMENT and PLAN  1. Lumbar radiculopathy - Misc. Devices (BATH/SHOWER SEAT) MISC; Use shower chair/seat while bathing  2. Cervical disc disorder with radiculopathy of cervical region - Misc. Devices (BATH/SHOWER SEAT) MISC; Use shower chair/seat while bathing  3. History of back surgery - Misc. Devices (BATH/SHOWER SEAT) MISC; Use shower chair/seat while bathing  4. Fibromyalgia  5. Decreased activities of daily living (ADL) - Misc. Devices (BATH/SHOWER SEAT) MISC; Use shower chair/seat while bathing  Patient  cont with symptoms of radiculopathy and decreased daily function Cont with PT Increasing lyrica Oxycodone per pain mgt Ordering shower chair Will defer STD paperwork until seen by specialists  Other orders - pregabalin (LYRICA) 150 MG capsule; Take 1 capsule (150 mg total) by mouth 2 (two) times daily.  Return in about 17 days (around 05/19/2018).    Rutherford Guys, MD Primary Care at [place], [place] [address] Ph.  [phone] Fax [phone]

## 2018-05-09 DIAGNOSIS — M79602 Pain in left arm: Secondary | ICD-10-CM | POA: Diagnosis not present

## 2018-05-09 DIAGNOSIS — M542 Cervicalgia: Secondary | ICD-10-CM | POA: Diagnosis not present

## 2018-05-09 DIAGNOSIS — M545 Low back pain: Secondary | ICD-10-CM | POA: Diagnosis not present

## 2018-05-10 ENCOUNTER — Encounter: Payer: Self-pay | Admitting: Family Medicine

## 2018-05-10 ENCOUNTER — Encounter: Payer: 59 | Admitting: Women's Health

## 2018-05-10 DIAGNOSIS — Z0289 Encounter for other administrative examinations: Secondary | ICD-10-CM

## 2018-05-15 ENCOUNTER — Telehealth: Payer: Self-pay

## 2018-05-15 DIAGNOSIS — M542 Cervicalgia: Secondary | ICD-10-CM | POA: Diagnosis not present

## 2018-05-15 DIAGNOSIS — M545 Low back pain: Secondary | ICD-10-CM | POA: Diagnosis not present

## 2018-05-15 DIAGNOSIS — M79602 Pain in left arm: Secondary | ICD-10-CM | POA: Diagnosis not present

## 2018-05-15 NOTE — Telephone Encounter (Signed)
Please write letter for patient's employeer stating to extend short term disability until she sees me on the 10th of dec. thanks

## 2018-05-16 DIAGNOSIS — M5442 Lumbago with sciatica, left side: Secondary | ICD-10-CM | POA: Diagnosis not present

## 2018-05-16 DIAGNOSIS — G894 Chronic pain syndrome: Secondary | ICD-10-CM | POA: Diagnosis not present

## 2018-05-16 DIAGNOSIS — M542 Cervicalgia: Secondary | ICD-10-CM | POA: Diagnosis not present

## 2018-05-18 DIAGNOSIS — M542 Cervicalgia: Secondary | ICD-10-CM | POA: Diagnosis not present

## 2018-05-18 DIAGNOSIS — M5126 Other intervertebral disc displacement, lumbar region: Secondary | ICD-10-CM | POA: Diagnosis not present

## 2018-05-19 DIAGNOSIS — M545 Low back pain: Secondary | ICD-10-CM | POA: Diagnosis not present

## 2018-05-19 DIAGNOSIS — M542 Cervicalgia: Secondary | ICD-10-CM | POA: Diagnosis not present

## 2018-05-19 DIAGNOSIS — M79602 Pain in left arm: Secondary | ICD-10-CM | POA: Diagnosis not present

## 2018-05-23 ENCOUNTER — Other Ambulatory Visit: Payer: Self-pay

## 2018-05-23 ENCOUNTER — Encounter: Payer: Self-pay | Admitting: Family Medicine

## 2018-05-23 ENCOUNTER — Ambulatory Visit (INDEPENDENT_AMBULATORY_CARE_PROVIDER_SITE_OTHER): Payer: 59 | Admitting: Family Medicine

## 2018-05-23 VITALS — BP 135/87 | HR 107 | Temp 98.8°F | Ht 60.0 in | Wt 178.2 lb

## 2018-05-23 DIAGNOSIS — M5416 Radiculopathy, lumbar region: Secondary | ICD-10-CM

## 2018-05-23 DIAGNOSIS — M797 Fibromyalgia: Secondary | ICD-10-CM | POA: Diagnosis not present

## 2018-05-23 DIAGNOSIS — M5010 Cervical disc disorder with radiculopathy, unspecified cervical region: Secondary | ICD-10-CM | POA: Diagnosis not present

## 2018-05-23 DIAGNOSIS — Z9889 Other specified postprocedural states: Secondary | ICD-10-CM | POA: Diagnosis not present

## 2018-05-23 MED ORDER — PREGABALIN 100 MG PO CAPS: 100.0000 mg | ORAL_CAPSULE | Freq: Two times a day (BID) | ORAL | 2 refills | Status: DC

## 2018-05-23 MED ORDER — DULOXETINE HCL 30 MG PO CPEP: 30.0000 mg | ORAL_CAPSULE | Freq: Every day | ORAL | 3 refills | Status: DC

## 2018-05-23 NOTE — Patient Instructions (Signed)
° ° ° °  If you have lab work done today you will be contacted with your lab results within the next 2 weeks.  If you have not heard from us then please contact us. The fastest way to get your results is to register for My Chart. ° ° °IF you received an x-ray today, you will receive an invoice from [facility]. Please contact [facility] at [phone] with questions or concerns regarding your invoice.  ° °IF you received labwork today, you will receive an invoice from [facility]. Please contact [facility] at [phone] with questions or concerns regarding your invoice.  ° °Our billing staff will not be able to assist you with questions regarding bills from these companies. ° °You will be contacted with the lab results as soon as they are available. The fastest way to get your results is to activate your My Chart account. Instructions are located on the last page of this paperwork. If you have not heard from us regarding the results in 2 weeks, please contact this office. °  ° ° ° °

## 2018-05-23 NOTE — Progress Notes (Signed)
12/10/20194:52 PM  [name] [name] [birth year], 52 y.o. female [id number]  Chief Complaint  Patient presents with  . Follow-up    back and neck pain. Pt is saying that job will not be taking ov notes, job is requesting doctor notes from pt visit    HPI:   Patient is a 52 y.o. female with past medical history significant for fibromyalgia, cervical radiculopathy, SI dysfunction, lumbosacral radiculopathy, s/p multiple spine surgeries, pre-diabetes, fatty liver who presents today forroutine followup  Seen by PT - has had about 8 sessions, not helping,  Last OV increased lyrica to 150mg  BID Saw pain specialist, Dr Webb Silversmith, thinks fibromyalgia not well controlled Saw Dr Kary Kos, spine specialist, multiple bulging discs and DDD, daughter went with her. She reports that L2-L3 might treat leg pain but nothing else, cervical spine surgery too risky for benefits. They have decided to not pursue further surgery Patient does not report any improvement with increase in lyrica but has her dizzy Unable to tolerate higher dose of gabapentin, taking 200mg  at bedtime Has never tried cymbalta, amitriptyline  She is unable to bend down, tie her shoes She is constantly shifting from sitting and standing Cant cook without taking breaks Not recent challenges  She is currently on short term disability from work Works in [employer], [employer], physically demanding work  Fall Risk  05/23/2018 05/02/2018 04/04/2018 03/28/2018 03/14/2018  Falls in the past year? 0 0 No No No     Depression screen [facility] 2/9 05/23/2018 04/04/2018 03/28/2018  Decreased Interest 0 0 0  Down, Depressed, Hopeless 0 0 -  PHQ - 2 Score 0 0 0  Altered sleeping - - 0  Tired, decreased energy - - -  Change in appetite - - -  Feeling bad or failure about yourself  - - -  Trouble concentrating - - -  Moving slowly or fidgety/restless - - -  Suicidal thoughts - - -  PHQ-9 Score - - 0  Difficult doing  work/chores - - -    Allergies  Allergen Reactions  . Latex Swelling  . Other     LATEX, kiwi, papaya, fish    Prior to Admission medications   Medication Sig Start Date End Date Taking? Authorizing Provider  Calcium Carbonate-Vitamin D (CALTRATE 600+D PO) Take by mouth.    [provider]  cholecalciferol (VITAMIN D) 1000 UNITS tablet Take 1,000 Units by mouth 2 (two) times daily.      [provider]  ibuprofen (ADVIL,MOTRIN) 800 MG tablet Take 1 tablet (800 mg total) by mouth every 8 (eight) hours as needed. 03/14/18   Rutherford Guys, MD  Misc. Devices (BATH/SHOWER SEAT) MISC Use shower chair/seat while bathing 05/02/18   Rutherford Guys, MD  omeprazole (PRILOSEC) 40 MG capsule Take 1 capsule (40 mg total) by mouth daily. 05/20/11   Esterwood, Amy S, PA-C  oxyCODONE (OXY IR/ROXICODONE) 5 MG immediate release tablet Take 1 tablet (5 mg total) by mouth every 6 (six) hours as needed for severe pain. 02/08/18   Rutherford Guys, MD  pravastatin (PRAVACHOL) 40 MG tablet Take 1 tablet (40 mg total) by mouth daily. 02/08/18   Rutherford Guys, MD  pregabalin (LYRICA) 150 MG capsule Take 1 capsule (150 mg total) by mouth 2 (two) times daily. 05/02/18   Rutherford Guys, MD  tizanidine (ZANAFLEX) 2 MG capsule Take 2 mg by mouth 3 (three) times daily.    [provider]    Past Medical  History:  Diagnosis Date  . Acid reflux   . Adenomatous colon polyp    2012  . Anxiety   . Constipation, chronic   . Degenerative disc disease, cervical    s/p epidural injections  . Depression   . Fatty liver disease, nonalcoholic    per ultrasound  . Fibromyalgia   . Hypercholesterolemia   . NSVD (normal spontaneous vaginal delivery)    X2  . Prediabetes    a1c 6.1  . Seizures ([place])    with dental procedure  . Vitamin D deficiency     Past Surgical History:  Procedure Laterality Date  . BACK SURGERY  2008  . BREAST SURGERY     REDUCTIVE MAMMOPLASTY  . herniated  disc    . LAPAROSCOPIC CHOLECYSTECTOMY    . LUMBAR DISC SURGERY     L-4 AND L-5   . PELVIC LAPAROSCOPY     BTSP  . TONSILLECTOMY AND ADENOIDECTOMY    . VAGINAL HYSTERECTOMY     Post Repair/TVT Sling    Social History   Tobacco Use  . Smoking status: Never Smoker  . Smokeless tobacco: Never Used  Substance Use Topics  . Alcohol use: No    Family History  Problem Relation Age of Onset  . Hypertension Sister   . Cancer Sister        colon ca... stepsister  . Colon cancer Sister     ROS Per hpi  OBJECTIVE:  Blood pressure 135/87, pulse (!) 107, temperature 98.8 F (37.1 C), height 5' (1.524 m), weight 178 lb 3.2 oz (80.8 kg), SpO2 97 %. Body mass index is 34.8 kg/m.   Physical Exam  Constitutional: She is oriented to person, place, and time. She appears well-developed and well-nourished.  HENT:  Head: Normocephalic and atraumatic.  Mouth/Throat: Mucous membranes are normal.  Eyes: Pupils are equal, round, and reactive to light. Conjunctivae and EOM are normal. No scleral icterus.  Neck: Neck supple.  Pulmonary/Chest: Effort normal.  Neurological: She is alert and oriented to person, place, and time.  Skin: Skin is warm and dry.  Psychiatric: She has a normal mood and affect.  Nursing note and vitals reviewed.    ASSESSMENT and PLAN  1. Fibromyalgia Not controlled. Treatment not optimized, decreasing lyrica to 100mg  BID given side effects, adding Cymbalta, new med r/se/b reviewed  2. Cervical disc disorder with radiculopathy of cervical region 3. Lumbar radiculopathy 4. History of back surgery Not a surgical candidate. PT not helping. No further epidural injections. Requesting records from specialists. Patient to return to work at this point, as medically optimized, overall chronic conditions with recent flare up.   Other orders - pregabalin (LYRICA) 100 MG capsule; Take 1 capsule (100 mg total) by mouth 2 (two) times daily. - DULoxetine (CYMBALTA) 30 MG  capsule; Take 1 capsule (30 mg total) by mouth daily.  Return in about 4 weeks (around 06/20/2018).    Rutherford Guys, MD Primary Care at [place], [place] [address] Ph.  [phone] Fax [phone]

## 2018-05-24 ENCOUNTER — Encounter: Payer: Self-pay | Admitting: Family Medicine

## 2018-05-24 DIAGNOSIS — M79602 Pain in left arm: Secondary | ICD-10-CM | POA: Diagnosis not present

## 2018-05-24 DIAGNOSIS — M542 Cervicalgia: Secondary | ICD-10-CM | POA: Diagnosis not present

## 2018-05-24 DIAGNOSIS — M545 Low back pain: Secondary | ICD-10-CM | POA: Diagnosis not present

## 2018-05-26 NOTE — Telephone Encounter (Signed)
Please extend short term disability for this patient until dec 10th which was date of last OV. thanks

## 2018-06-01 ENCOUNTER — Ambulatory Visit: Payer: 59 | Admitting: Family Medicine

## 2018-06-02 ENCOUNTER — Ambulatory Visit: Payer: 59 | Admitting: Family Medicine

## 2018-07-24 DIAGNOSIS — M79602 Pain in left arm: Secondary | ICD-10-CM | POA: Diagnosis not present

## 2018-07-24 DIAGNOSIS — M542 Cervicalgia: Secondary | ICD-10-CM | POA: Diagnosis not present

## 2018-07-24 DIAGNOSIS — M545 Low back pain: Secondary | ICD-10-CM | POA: Diagnosis not present

## 2018-07-25 DIAGNOSIS — G894 Chronic pain syndrome: Secondary | ICD-10-CM | POA: Diagnosis not present

## 2018-07-25 DIAGNOSIS — M5442 Lumbago with sciatica, left side: Secondary | ICD-10-CM | POA: Diagnosis not present

## 2018-07-25 DIAGNOSIS — M542 Cervicalgia: Secondary | ICD-10-CM | POA: Diagnosis not present

## 2018-08-31 DIAGNOSIS — L819 Disorder of pigmentation, unspecified: Secondary | ICD-10-CM | POA: Diagnosis not present

## 2018-09-02 ENCOUNTER — Other Ambulatory Visit: Payer: Self-pay | Admitting: *Deleted

## 2018-09-02 DIAGNOSIS — R7303 Prediabetes: Secondary | ICD-10-CM

## 2018-09-02 DIAGNOSIS — E785 Hyperlipidemia, unspecified: Secondary | ICD-10-CM

## 2018-09-02 DIAGNOSIS — M5030 Other cervical disc degeneration, unspecified cervical region: Secondary | ICD-10-CM

## 2018-09-02 DIAGNOSIS — E559 Vitamin D deficiency, unspecified: Secondary | ICD-10-CM

## 2018-09-07 ENCOUNTER — Ambulatory Visit: Payer: 59 | Admitting: Family Medicine

## 2018-09-07 ENCOUNTER — Other Ambulatory Visit: Payer: Self-pay

## 2018-09-18 NOTE — Telephone Encounter (Signed)
Called and let know patients note was ready for pick up

## 2018-09-20 ENCOUNTER — Telehealth: Payer: Self-pay

## 2018-09-20 ENCOUNTER — Encounter: Payer: Self-pay | Admitting: Family Medicine

## 2018-09-21 DIAGNOSIS — M5442 Lumbago with sciatica, left side: Secondary | ICD-10-CM | POA: Diagnosis not present

## 2018-09-21 DIAGNOSIS — G894 Chronic pain syndrome: Secondary | ICD-10-CM | POA: Diagnosis not present

## 2018-09-21 DIAGNOSIS — M542 Cervicalgia: Secondary | ICD-10-CM | POA: Diagnosis not present

## 2018-09-27 ENCOUNTER — Encounter: Payer: Self-pay | Admitting: Family Medicine

## 2018-09-27 DIAGNOSIS — L819 Disorder of pigmentation, unspecified: Secondary | ICD-10-CM

## 2018-10-02 ENCOUNTER — Other Ambulatory Visit: Payer: Self-pay

## 2018-10-02 ENCOUNTER — Ambulatory Visit (INDEPENDENT_AMBULATORY_CARE_PROVIDER_SITE_OTHER): Payer: 59 | Admitting: Family Medicine

## 2018-10-02 DIAGNOSIS — E559 Vitamin D deficiency, unspecified: Secondary | ICD-10-CM

## 2018-10-02 DIAGNOSIS — R7303 Prediabetes: Secondary | ICD-10-CM

## 2018-10-02 DIAGNOSIS — E785 Hyperlipidemia, unspecified: Secondary | ICD-10-CM

## 2018-10-02 NOTE — Telephone Encounter (Signed)
FYI, she needs a lab visit appt for TODAY 4/20 at 330 pm thanks

## 2018-10-03 ENCOUNTER — Ambulatory Visit: Payer: 59 | Admitting: Internal Medicine

## 2018-10-03 LAB — COMPREHENSIVE METABOLIC PANEL
ALT: 52 IU/L — ABNORMAL HIGH (ref 0–32)
AST: 36 IU/L (ref 0–40)
Albumin/Globulin Ratio: 1.8 (ref 1.2–2.2)
Albumin: 5 g/dL — ABNORMAL HIGH (ref 3.8–4.9)
Alkaline Phosphatase: 75 IU/L (ref 39–117)
BUN/Creatinine Ratio: 14 (ref 9–23)
BUN: 8 mg/dL (ref 6–24)
Bilirubin Total: 0.3 mg/dL (ref 0.0–1.2)
CO2: 23 mmol/L (ref 20–29)
Calcium: 9.9 mg/dL (ref 8.7–10.2)
Chloride: 98 mmol/L (ref 96–106)
Creatinine, Ser: 0.59 mg/dL (ref 0.57–1.00)
GFR calc Af Amer: 121 mL/min/{1.73_m2} (ref >59)
GFR calc non Af Amer: 105 mL/min/{1.73_m2} (ref >59)
Globulin, Total: 2.8 g/dL (ref 1.5–4.5)
Glucose: 86 mg/dL (ref 65–99)
Potassium: 4.4 mmol/L (ref 3.5–5.2)
Sodium: 140 mmol/L (ref 134–144)
Total Protein: 7.8 g/dL (ref 6.0–8.5)

## 2018-10-03 LAB — LIPID PANEL
Chol/HDL Ratio: 4 ratio (ref 0.0–4.4)
Cholesterol, Total: 209 mg/dL — ABNORMAL HIGH (ref 100–199)
HDL: 52 mg/dL (ref >39)
LDL Calculated: 89 mg/dL (ref 0–99)
Triglycerides: 338 mg/dL — ABNORMAL HIGH (ref 0–149)
VLDL Cholesterol Cal: 68 mg/dL — ABNORMAL HIGH (ref 5–40)

## 2018-10-03 LAB — VITAMIN D 25 HYDROXY (VIT D DEFICIENCY, FRACTURES): Vit D, 25-Hydroxy: 27.1 ng/mL — ABNORMAL LOW (ref 30.0–100.0)

## 2018-10-03 LAB — HEMOGLOBIN A1C
Est. average glucose Bld gHb Est-mCnc: 128 mg/dL
Hgb A1c MFr Bld: 6.1 % — ABNORMAL HIGH (ref 4.8–5.6)

## 2018-10-09 ENCOUNTER — Encounter: Payer: Self-pay | Admitting: Family Medicine

## 2018-10-17 ENCOUNTER — Ambulatory Visit (INDEPENDENT_AMBULATORY_CARE_PROVIDER_SITE_OTHER): Payer: 59 | Admitting: Internal Medicine

## 2018-10-17 ENCOUNTER — Other Ambulatory Visit: Payer: Self-pay

## 2018-10-17 ENCOUNTER — Encounter: Payer: Self-pay | Admitting: Internal Medicine

## 2018-10-17 VITALS — BP 122/72 | HR 88 | Temp 98.0°F | Ht 60.0 in | Wt 181.2 lb

## 2018-10-17 DIAGNOSIS — R6889 Other general symptoms and signs: Secondary | ICD-10-CM | POA: Diagnosis not present

## 2018-10-17 DIAGNOSIS — L83 Acanthosis nigricans: Secondary | ICD-10-CM | POA: Diagnosis not present

## 2018-10-17 DIAGNOSIS — L819 Disorder of pigmentation, unspecified: Secondary | ICD-10-CM

## 2018-10-17 DIAGNOSIS — E781 Pure hyperglyceridemia: Secondary | ICD-10-CM | POA: Insufficient documentation

## 2018-10-17 DIAGNOSIS — K7581 Nonalcoholic steatohepatitis (NASH): Secondary | ICD-10-CM

## 2018-10-17 MED ORDER — METFORMIN HCL ER 500 MG PO TB24: 500.0000 mg | ORAL_TABLET | Freq: Every day | ORAL | 6 refills | Status: DC

## 2018-10-17 NOTE — Progress Notes (Signed)
Name: [name]  MRN/ DOB: [id number], [birth year]    Age/ Sex: 53 y.o., female    PCP: Rutherford Guys, MD   Reason for Endocrinology Evaluation: Hyperpigmentation     Date of Initial Endocrinology Evaluation: 10/17/2018     HPI: [name] is a 53 y.o. female with a past medical history of obesity, dyslipidemia, fatty liver , chronic pain syndrome and prediabetes. The patient presented for initial endocrinology clinic visit on 10/17/2018 for consultative assistance with her hyperpigmentation.      Accompanied by Daughter Laverda Sorenson   [name] was referred for concerns of adrenal insufficiency due to hyperpigmentation of the skin.  Patient has noted darkening of the skin around her chin area ~ a year ago. Since then its speading to the rest of her face, neck , inner elbows and abdominal area. No itching or burning .    She has been diagnosed with prediabetes for a few years, she is not following a certain diet nor is she able to exercise due to back pain. She also has fatty liver since at least 2009 but LFT's have been trending down  She also  Hypertriglyceridemia since 2009, currently on Pravastatin. Confirms compliance with no side effects.   Due to her chronic pain syndrome she received multiple steroid injections over the years, in 2019 she estimates received somewhere between 4-5 glucocortoid injections. Last injection was in 07/2018 in her neck.     FH of eczema   HISTORY:  Past Medical History:  Past Medical History:  Diagnosis Date  . Acid reflux   . Adenomatous colon polyp    2012  . Anxiety   . Constipation, chronic   . Degenerative disc disease, cervical    s/p epidural injections  . Depression   . Fatty liver disease, nonalcoholic    per ultrasound  . Fibromyalgia   . Hypercholesterolemia   . NSVD (normal spontaneous vaginal delivery)    X2  . Prediabetes    a1c 6.1  . Seizures ([place])    with dental procedure  .  Vitamin D deficiency    Past Surgical History:  Past Surgical History:  Procedure Laterality Date  . BACK SURGERY  2008  . BREAST SURGERY     REDUCTIVE MAMMOPLASTY  . herniated disc    . LAPAROSCOPIC CHOLECYSTECTOMY    . LUMBAR DISC SURGERY     L-4 AND L-5   . PELVIC LAPAROSCOPY     BTSP  . TONSILLECTOMY AND ADENOIDECTOMY    . VAGINAL HYSTERECTOMY     Post Repair/TVT Sling      Social History:  reports that she has never smoked. She has never used smokeless tobacco. She reports that she does not drink alcohol or use drugs.  Family History: family history includes Cancer in her sister; Colon cancer in her sister; Hypertension in her sister.   HOME MEDICATIONS: Allergies as of 10/17/2018      Reactions   Latex Swelling   Other    LATEX, kiwi, papaya, fish      Medication List       Accurate as of Oct 17, 2018  3:28 PM. Always use your most recent med list.        Bath/Shower Seat Misc Use shower chair/seat while bathing   CALTRATE 600+D PO Take by mouth.   cholecalciferol 1000 units tablet Commonly known as:  VITAMIN D Take 1,000 Units by mouth 2 (two) times daily.   gabapentin 100  MG capsule Commonly known as:  NEURONTIN Take 200 mg by mouth at bedtime.   metFORMIN 500 MG 24 hr tablet Commonly known as:  Glucophage XR Take 1 tablet (500 mg total) by mouth daily with breakfast.   oxyCODONE 5 MG immediate release tablet Commonly known as:  Oxy IR/ROXICODONE Take 1 tablet (5 mg total) by mouth every 6 (six) hours as needed for severe pain.   pravastatin 40 MG tablet Commonly known as:  PRAVACHOL Take 1 tablet (40 mg total) by mouth daily.   tizanidine 2 MG capsule Commonly known as:  ZANAFLEX Take 2 mg by mouth 3 (three) times daily.         REVIEW OF SYSTEMS: A comprehensive ROS was conducted with the patient and is negative except as per HPI and below:  Review of Systems  Constitutional: Negative for fever and weight loss.  HENT: Negative for  congestion and sore throat.   Eyes: Positive for blurred vision. Negative for pain.  Respiratory: Negative for cough and shortness of breath.   Cardiovascular: Negative for chest pain and palpitations.  Gastrointestinal: Negative for diarrhea and nausea.  Genitourinary: Negative for frequency.  Neurological: Positive for tingling. Negative for tremors.  Endo/Heme/Allergies: Negative for polydipsia.  Psychiatric/Behavioral: Positive for depression. The patient is nervous/anxious.        OBJECTIVE:  VS: BP 122/72 (BP Location: Left Arm, Patient Position: Sitting, Cuff Size: Normal)   Pulse 88   Temp 98 F (36.7 C)   Ht 5' (1.524 m)   Wt 181 lb 3.2 oz (82.2 kg)   SpO2 98%   BMI 35.39 kg/m    Wt Readings from Last 3 Encounters:  10/17/18 181 lb 3.2 oz (82.2 kg)  05/23/18 178 lb 3.2 oz (80.8 kg)  05/02/18 180 lb 3.2 oz (81.7 kg)     EXAM: General: Pt appears well, pt with centri pedal obesity , thin extremities  Hydration: Well-hydrated with moist mucous membranes and good skin turgor  Eyes: External eye exam normal without stare, lid lag or exophthalmos.  EOM intact.  Ears, Nose, Throat: Hearing: Grossly intact bilaterally Dental: Good dentition , no pigmentation over the buccal mucosa.  Throat: Clear without mass, erythema or exudate  Neck: General: Supple without adenopathy. Thyroid: Thyroid size normal.  No goiter or nodules appreciated. No thyroid bruit.  Lungs: Clear with good BS bilat with no rales, rhonchi, or wheezes  Heart: Auscultation: RRR.  Abdomen: Normoactive bowel sounds, soft, nontender, without masses or organomegaly palpable. Wide striae with purple hue noted on her abdominal wall   Extremities:  BL LE: No pretibial edema normal ROM and strength.  Skin: Hair: Texture and amount normal with gender appropriate distribution Skin Inspection: Evidence of hyperpigmentation over the chin, temples, antecubital fossa and anterior abdominal wall (above umbilicus)  Skin Palpation: Skin temperature, texture, and thickness normal to palpation  Neuro:  DTRs: 2+ and symmetric in UE without delay in relaxation phase  Mental Status: Judgment, insight: Intact Orientation: Oriented to time, place, and person Mood and affect: No depression, anxiety, or agitation     DATA REVIEWED: [name], Annaleia MASIS (MRN [id number]) as of 10/17/2018 15:10  Ref. Range 10/27/2017 10:33 10/02/2018 16:24  Hemoglobin A1C Latest Ref Range: 4.8 - 5.6 % 6.1 (H) 6.1 (H)    [name], Bernadine MASIS (MRN [id number]) as of 10/17/2018 15:10  Ref. Range 10/27/2017 10:33  TSH Latest Ref Range: 0.450 - 4.500 uIU/mL 1.000    ASSESSMENT/PLAN/RECOMMENDATIONS:   1. Hyperpigmentation:  -  D/D include Maturational hyperpigmentation, postinflammatory hyperpigmentation, acanthosis nigricans (insulin resistance) ,hemochromatosis ,  clinically there's NO evidence of adrenal insufficiency. If anything she has clinical features of cushing syndrome.  I will obtain ferritin levels on her, given hx of skin discoloration, NASH and prediabetes status.    2. Cushingoid facies:  - Given weight gain, moon facies, abdominal striae and evidence of insulin resistance, I will screen her for cushing syndrome with salivary cortisol testing. I explained to her that she is more likely to have iatrogenic cushing from chronic intra-articular steroid injections rather endogenous cushing, but will proceed with screening for endogenous cushing.   3. Prediabetes :  - We extensively discussed  Low CHO diet, unfortunately due to musculoskeletal pain , she is limited in physical activity, I have encouraged her to use her daughter's pool weather appropriate and once this pandemic is over, she should look in to aquatic classes - We discussed her high risk in to advancing to diabetes, we discussed how lifestyle changes are more successful then metformin, but in this case I recommend adding  metformin due to physicial limitation discussed above.  - Discussed GI side effects of metformin  Medications :  Metformin 500 mg XR one tablet daily with Breakfast    4. Hypertriglyceridemia:   - Pt attributes this to non-fasting status on her last lab draw, but her TG have been chronically high. - I have advised her that cutting down on carbohydrates will improve her TG levels. She was advised to come fasting on her next appointment for a recheck. If TG continue to be high, I will recommend adding fenofibrate's.    5. NASH (Non-alcoholic steatohepatitis):  - This was diagnosed through imaging from 2009 - Her LFT's have been improving.  - Discussed weight loss as a first line in management.    F/u in 3 months   Signed electronically by: Mack Guise, MD  [facility] Endocrinology  [facility] [address] [place], [place] [address] Phone: [phone] FAX: [phone]   CC: Rutherford Guys, MD [address] [place] [address] Phone: [phone] Fax: [phone]   Return to Endocrinology clinic as below: Future Appointments  Date Time Provider [place]  11/02/2018 11:00 AM Irene Shipper, MD [facility]  01/16/2019 10:50 AM Jakara Blatter, Melanie Crazier, MD [facility] None

## 2018-10-17 NOTE — Patient Instructions (Signed)
SALIVARY CORTISOL COLLECTION INSTRUCTIONS    Precautions:  1. Please collect sample at 11:30 pm. You will need to do this on 2 nights  2. No food or fluids 30 minutes prior to collection.  3. Do not use any creams, lotions on hands, or use steroid inhalers immediately prior to collection.  4. Wash hands carefully.  5. Avoid any activity that could cause your gums to bleed: including flushing of brushing your teeth.  6. Kit must not be used in children less than 3 years of age, or a person that is at risk for choking on collection kit.  Instructions for saliva collection:   1. Rinse mouth thoroughly with water and discard. Do not swallow.  2. Hold the Salivette at the rim of the suspended insert and remove the stopper.  3. Remove the swab.  4. Place swab under tongue until well saturated, approximately 1 minute.   5. Return the saturated swab to the suspended insert and close the Salivette firmly with the stopper.  6. Do not remove the tube holding the insert. The Salivette should be sent to the lab with the swab.   7. Come to the lab and leave the Salivette kit for labeling with your identifying information.   8. Make sure you refrigerate sample if not bringing to the lab immediately. Try to use cold packs for transportation if available.

## 2018-10-20 ENCOUNTER — Other Ambulatory Visit (INDEPENDENT_AMBULATORY_CARE_PROVIDER_SITE_OTHER): Payer: 59

## 2018-10-20 ENCOUNTER — Other Ambulatory Visit: Payer: Self-pay

## 2018-10-20 DIAGNOSIS — K7581 Nonalcoholic steatohepatitis (NASH): Secondary | ICD-10-CM | POA: Diagnosis not present

## 2018-10-20 DIAGNOSIS — R6889 Other general symptoms and signs: Secondary | ICD-10-CM | POA: Diagnosis not present

## 2018-10-20 DIAGNOSIS — E781 Pure hyperglyceridemia: Secondary | ICD-10-CM

## 2018-10-21 LAB — IRON,TIBC AND FERRITIN PANEL
%SAT: 20 % (calc) (ref 16–45)
Ferritin: 109 ng/mL (ref 16–232)
Iron: 77 ug/dL (ref 45–160)
TIBC: 378 mcg/dL (calc) (ref 250–450)

## 2018-10-25 LAB — SALIVARY CORTISOL X2, TIMED
Salivary Cortisol 2nd Specimen: >1 ug/dL
Salivary Cortisol Baseline: >1 ug/dL

## 2018-10-26 ENCOUNTER — Telehealth: Payer: Self-pay | Admitting: Internal Medicine

## 2018-10-26 NOTE — Telephone Encounter (Signed)
Interpreter [employer]

## 2018-10-27 ENCOUNTER — Telehealth: Payer: Self-pay | Admitting: Internal Medicine

## 2018-10-27 DIAGNOSIS — R6889 Other general symptoms and signs: Secondary | ICD-10-CM

## 2018-10-27 NOTE — Telephone Encounter (Signed)
Discussed abnormal salivary cortisol through the [place] interpreter , pt denies contamination  Or use of a cream prior to obtaining the sample or few hours prior. She does not have HC cream  Or anti-itch cream.   Will proceed with 24-hr urine collection for cortisol.    Discussed below instructions  24-Hour Urine Collection   You will be collecting your urine for a 24-hour period of time.  Your timer starts with your first urine of the morning (For example - If you first pee at [place], your timer will start at [place])  [place] away your first urine of the morning  Collect your urine every time you pee for the next 24 hours STOP your urine collection 24 hours after you started the collection (For example - You would stop at 9AM the day after you started)    Pt expressed understanding   Abby Nena Jordan, MD  [facility] Endocrinology  [facility] [address] [place], [place] [address] Phone: [phone] FAX: [phone]

## 2018-10-30 ENCOUNTER — Other Ambulatory Visit: Payer: Self-pay

## 2018-11-01 ENCOUNTER — Other Ambulatory Visit: Payer: 59

## 2018-11-01 ENCOUNTER — Other Ambulatory Visit: Payer: Self-pay

## 2018-11-01 DIAGNOSIS — R6889 Other general symptoms and signs: Secondary | ICD-10-CM

## 2018-11-02 ENCOUNTER — Ambulatory Visit: Payer: 59 | Admitting: Internal Medicine

## 2018-11-03 NOTE — Telephone Encounter (Signed)
Closing encounter

## 2018-11-07 LAB — TEST AUTHORIZATION

## 2018-11-07 LAB — EXTRA URINE SPECIMEN

## 2018-11-07 LAB — CORTISOL, URINE, 24 HOUR
24 Hour urine volume (VMAHVA): 2650 mL
Cortisol (Ur), Free: 41.4 mcg/24 h (ref 4.0–50.0)
RESULTS RECEIVED: 1.14 g/(24.h) (ref 0.50–2.15)

## 2019-01-16 ENCOUNTER — Other Ambulatory Visit: Payer: Self-pay

## 2019-01-16 ENCOUNTER — Ambulatory Visit (INDEPENDENT_AMBULATORY_CARE_PROVIDER_SITE_OTHER): Payer: 59 | Admitting: Internal Medicine

## 2019-01-16 ENCOUNTER — Encounter: Payer: Self-pay | Admitting: Internal Medicine

## 2019-01-16 VITALS — BP 120/74 | HR 87 | Temp 98.4°F | Ht 60.0 in | Wt 176.8 lb

## 2019-01-16 DIAGNOSIS — E781 Pure hyperglyceridemia: Secondary | ICD-10-CM

## 2019-01-16 DIAGNOSIS — R5383 Other fatigue: Secondary | ICD-10-CM

## 2019-01-16 DIAGNOSIS — R6889 Other general symptoms and signs: Secondary | ICD-10-CM | POA: Diagnosis not present

## 2019-01-16 DIAGNOSIS — R7303 Prediabetes: Secondary | ICD-10-CM | POA: Diagnosis not present

## 2019-01-16 DIAGNOSIS — K760 Fatty (change of) liver, not elsewhere classified: Secondary | ICD-10-CM | POA: Insufficient documentation

## 2019-01-16 LAB — POCT GLYCOSYLATED HEMOGLOBIN (HGB A1C): Hemoglobin A1C: 6 % — AB (ref 4.0–5.6)

## 2019-01-16 LAB — COMPREHENSIVE METABOLIC PANEL
ALT: 47 U/L — ABNORMAL HIGH (ref 0–35)
AST: 30 U/L (ref 0–37)
Albumin: 4.8 g/dL (ref 3.5–5.2)
Alkaline Phosphatase: 73 U/L (ref 39–117)
BUN: 9 mg/dL (ref 6–23)
CO2: 28 mEq/L (ref 19–32)
Calcium: 10.2 mg/dL (ref 8.4–10.5)
Chloride: 102 mEq/L (ref 96–112)
Creatinine, Ser: 0.53 mg/dL (ref 0.40–1.20)
GFR: 120.43 mL/min (ref >60.00)
Glucose, Bld: 91 mg/dL (ref 70–99)
Potassium: 4.2 mEq/L (ref 3.5–5.1)
Sodium: 138 mEq/L (ref 135–145)
Total Bilirubin: 0.6 mg/dL (ref 0.2–1.2)
Total Protein: 8.2 g/dL (ref 6.0–8.3)

## 2019-01-16 LAB — TSH: TSH: 1.42 u[IU]/mL (ref 0.35–4.50)

## 2019-01-16 LAB — LIPID PANEL
Cholesterol: 201 mg/dL — ABNORMAL HIGH (ref 0–200)
HDL: 55.3 mg/dL (ref >39.00)
NonHDL: 145.38
Total CHOL/HDL Ratio: 4
Triglycerides: 246 mg/dL — ABNORMAL HIGH (ref 0.0–149.0)
VLDL: 49.2 mg/dL — ABNORMAL HIGH (ref 0.0–40.0)

## 2019-01-16 LAB — LDL CHOLESTEROL, DIRECT: Direct LDL: 121 mg/dL

## 2019-01-16 LAB — T4, FREE: Free T4: 0.9 ng/dL (ref 0.60–1.60)

## 2019-01-16 MED ORDER — METFORMIN HCL 500 MG PO TABS: 500.0000 mg | ORAL_TABLET | Freq: Every day | ORAL | 3 refills | Status: DC

## 2019-01-16 NOTE — Progress Notes (Signed)
Name: [name]  Age/ Sex: 53 y.o., female   MRN/ DOB: [id number], Aug 20, 1965     PCP: Rutherford Guys, MD   Reason for Endocrinology Evaluation: Type 2 Diabetes Mellitus  Initial Endocrine Consultative Visit: 10/17/18    PATIENT IDENTIFIER: [name] is a 53 y.o. female with a past medical history of obesity, dyslipidemia, fatty liver , chronic pain syndrome and prediabetes. . The patient has followed with Endocrinology clinic since 10/17/2018 for consultative assistance with management of her diabetes.    She was initially referred to me for concerns of adrenal insufficiency due to hyperpigmentation of the skin.  Patient has noted darkening of the skin around her chin area ~ a year ago. Since then its speading to the rest of her face, neck , inner elbows and abdominal area. No itching or burning .    She has been diagnosed with prediabetes for a few years, she is not following a certain diet nor is she able to exercise due to back pain. She also has fatty liver since at least 2009 but LFT's have been trending down  She also  Hypertriglyceridemia since 2009, currently on Pravastatin. Confirms compliance with no side effects.   Due to her chronic pain syndrome she received multiple steroid injections over the years, in 2019 she estimates received somewhere between 4-5 glucocortoid injections. Last injection was in 07/2018 in her neck.   Her salivary cortisol was high and was thought to be due to contamination. We proceeded with 24- urine cortisol was < 50 mcg ( 41.4 ) .     SUBJECTIVE:    Today (01/16/2019): [name] is here for follow up skin hyperpigmentation and pre-diabetes.  She checks her blood sugars 0 times daily.She has not changed her diet, stating she doesn;t eat much, she eats 3 meals a day and snacks sometimes. She does admit to eating fried food.  She is limited in her physical activity due to chronic pain.  She has depression ,  was tried on Cymbalta in the past but she didn;t like the side effects   She is c/o fatigue.   ROS: As per HPI and as detailed below: Review of Systems  Cardiovascular: Negative for chest pain and palpitations.  Gastrointestinal: Negative for diarrhea and nausea.  Neurological: Negative for tingling and tremors.  Psychiatric/Behavioral: Positive for depression. The patient is not nervous/anxious.       HOME DIABETES REGIMEN: Metformin 500 mg with breakfast      HISTORY:  Past Medical History:  Past Medical History:  Diagnosis Date  . Acid reflux   . Adenomatous colon polyp    2012  . Anxiety   . Constipation, chronic   . Degenerative disc disease, cervical    s/p epidural injections  . Depression   . Fatty liver disease, nonalcoholic    per ultrasound  . Fibromyalgia   . Hypercholesterolemia   . NSVD (normal spontaneous vaginal delivery)    X2  . Prediabetes    a1c 6.1  . Seizures ([place])    with dental procedure  . Vitamin D deficiency    Past Surgical History:  Past Surgical History:  Procedure Laterality Date  . BACK SURGERY  2008  . BREAST SURGERY     REDUCTIVE MAMMOPLASTY  . herniated disc    . LAPAROSCOPIC CHOLECYSTECTOMY    . LUMBAR DISC SURGERY     L-4 AND L-5   . PELVIC LAPAROSCOPY     BTSP  .  TONSILLECTOMY AND ADENOIDECTOMY    . VAGINAL HYSTERECTOMY     Post Repair/TVT Sling    Social History:  reports that she has never smoked. She has never used smokeless tobacco. She reports that she does not drink alcohol or use drugs. Family History:  Family History  Problem Relation Age of Onset  . Hypertension Sister   . Cancer Sister        colon ca... stepsister  . Colon cancer Sister      HOME MEDICATIONS: Allergies as of 01/16/2019      Reactions   Latex Swelling   Other    LATEX, kiwi, papaya, fish      Medication List       Accurate as of January 16, 2019 12:24 PM. If you have any questions, ask your nurse or doctor.        STOP  taking these medications   metFORMIN 500 MG 24 hr tablet Commonly known as: Glucophage XR Replaced by: metFORMIN 500 MG tablet Stopped by: Dorita Sciara, MD     TAKE these medications   Bath/Shower Seat Misc Use shower chair/seat while bathing   CALTRATE 600+D PO Take by mouth.   cholecalciferol 1000 units tablet Commonly known as: VITAMIN D Take 1,000 Units by mouth 2 (two) times daily.   gabapentin 100 MG capsule Commonly known as: NEURONTIN Take 200 mg by mouth at bedtime.   metFORMIN 500 MG tablet Commonly known as: GLUCOPHAGE Take 1 tablet (500 mg total) by mouth daily with breakfast. Replaces: metFORMIN 500 MG 24 hr tablet Started by: Dorita Sciara, MD   oxyCODONE 5 MG immediate release tablet Commonly known as: Oxy IR/ROXICODONE Take 1 tablet (5 mg total) by mouth every 6 (six) hours as needed for severe pain.   pravastatin 40 MG tablet Commonly known as: PRAVACHOL Take 1 tablet (40 mg total) by mouth daily.   tizanidine 2 MG capsule Commonly known as: ZANAFLEX Take 2 mg by mouth 3 (three) times daily.        OBJECTIVE:   Vital Signs: BP 120/74 (BP Location: Left Arm, Patient Position: Sitting, Cuff Size: Normal)   Pulse 87   Temp 98.4 F (36.9 C)   Ht 5' (1.524 m)   Wt 176 lb 12.8 oz (80.2 kg)   SpO2 96%   BMI 34.53 kg/m   Wt Readings from Last 3 Encounters:  01/16/19 176 lb 12.8 oz (80.2 kg)  10/17/18 181 lb 3.2 oz (82.2 kg)  05/23/18 178 lb 3.2 oz (80.8 kg)     Exam: General: Pt appears well and is in NAD  Neck: General: Supple without adenopathy. Thyroid: Thyroid size normal.  No goiter or nodules appreciated. No thyroid bruit.  Lungs: Clear with good BS bilat with no rales, rhonchi, or wheezes  Heart: RRR with normal S1 and S2 and no gallops; no murmurs; no rub  Abdomen: Normoactive bowel sounds, soft, nontender, without masses or organomegaly palpable  Extremities: No pretibial edema.  Skin: Normal texture and  temperature to palpation. Hyperpigmented macules on the face and arms again noted.   Neuro: MS is good with appropriate affect, pt is alert and Ox3     DATA REVIEWED:  Lab Results  Component Value Date   HGBA1C 6.0 (A) 01/16/2019   HGBA1C 6.1 (H) 10/02/2018   HGBA1C 6.1 (H) 10/27/2017   Lab Results  Component Value Date   LDLCALC 89 10/02/2018   CREATININE 0.59 10/02/2018    Lab Results  Component Value  Date   CHOL 209 (H) 10/02/2018   HDL 52 10/02/2018   LDLCALC 89 10/02/2018   LDLDIRECT 156.2 10/22/2008   TRIG 338 (H) 10/02/2018   CHOLHDL 4.0 10/02/2018         ASSESSMENT / PLAN / RECOMMENDATIONS:   1) Pre-Diabetes  :   - Praised the pt on weight loss, she is not sure how that happened as no lifestyle changes we made (? Metformin)   - We again discussed the importance of lifestyle changes and continued weight loss in the setting of metabolic syndrome.   - I have advised her to avoid sugar-sweetened beverages and avoid snacks.    MEDICATIONS:  Continue Metformin 500 mg daily    2) Hypertriglyceridemia :  - We discussed low fat diet - TG are trending down  - Will switch Pravastatin to Atorvastatin 20 mg daily and reassess in 6 months   3) Fatigue:    - This is multifactorial given active depression and chronic pain syndrome. I have encouraged her to address this with her PCP , as there are other anti-depressants that she has not tried and could be able to tolerate better then cymbalta. We did discuss the dual benefit of cymbalta as an anti-depressant and for pain as well.     4) Fatty Liver:   - This was discovered on imaging  - Encouraged continued  weight loss - We discuss that actos is beneficial for fatty liver and diabetes, we also discussed side effects of weight gain.  - ALT is trending down     5) Hyperpigmentation :   - Phenotypically she has cushingoid features, rather then adrenal insufficiency. She has failed the salivary cortisol  testing due to contamination, 24-hr urine collection for cortisol was normal, will repeat salivary cortisol, so we could have 2 negative screening tests  - I have advised her that this condition is beyond the scope of endocrinology, she could have maturational hyperpigmentation, she has tried whitening creams in the past.     F/U in 6 months   Addendum: results discussed with the patient on 8/6 through interpreter line at 9:30 Am.    Signed electronically by: Mack Guise, MD  [facility] Endocrinology  [facility] [address] [place], [place] [address] Phone: [phone] FAX: [phone]   CC: Rutherford Guys, MD [address] [place] [address] Phone: [phone]  Fax: [phone]  Return to Endocrinology clinic as below: Future Appointments  Date Time Provider [place]  07/19/2019 10:30 AM Breleigh Carpino, Melanie Crazier, MD [facility] None

## 2019-01-16 NOTE — Patient Instructions (Signed)
-   Continue Metformin 500 mg with breakfast (changed to regular release)

## 2019-01-18 MED ORDER — ATORVASTATIN CALCIUM 20 MG PO TABS: 20.0000 mg | ORAL_TABLET | Freq: Every day | ORAL | 3 refills | Status: DC

## 2019-02-01 ENCOUNTER — Encounter: Payer: 59 | Admitting: Family Medicine

## 2019-02-05 ENCOUNTER — Other Ambulatory Visit: Payer: Self-pay | Admitting: Family Medicine

## 2019-02-14 ENCOUNTER — Other Ambulatory Visit: Payer: Self-pay

## 2019-02-14 ENCOUNTER — Other Ambulatory Visit: Payer: 59

## 2019-02-14 DIAGNOSIS — R6889 Other general symptoms and signs: Secondary | ICD-10-CM

## 2019-02-18 ENCOUNTER — Other Ambulatory Visit: Payer: Self-pay | Admitting: Family Medicine

## 2019-02-21 LAB — SALIVARY CORTISOL X2, TIMED
Salivary Cortisol 2nd Specimen: 0.15 ug/dL
Salivary Cortisol Baseline: 0.118 ug/dL

## 2019-02-23 ENCOUNTER — Ambulatory Visit (INDEPENDENT_AMBULATORY_CARE_PROVIDER_SITE_OTHER): Payer: 59 | Admitting: Internal Medicine

## 2019-02-23 ENCOUNTER — Other Ambulatory Visit: Payer: Self-pay

## 2019-02-23 ENCOUNTER — Encounter: Payer: Self-pay | Admitting: Internal Medicine

## 2019-02-23 VITALS — BP 122/80 | HR 94 | Temp 98.8°F | Ht 60.0 in | Wt 177.2 lb

## 2019-02-23 DIAGNOSIS — R947 Abnormal results of other endocrine function studies: Secondary | ICD-10-CM

## 2019-02-23 DIAGNOSIS — R6889 Other general symptoms and signs: Secondary | ICD-10-CM

## 2019-02-23 NOTE — Progress Notes (Signed)
Name: [name]  Age/ Sex: 53 y.o., female   MRN/ DOB: ID:[id number], 1965-11-07     PCP: Rutherford Guys, MD   Reason for Endocrinology Evaluation: Type 2 Diabetes Mellitus  Initial Endocrine Consultative Visit: 10/17/18    PATIENT IDENTIFIER: [name] is a 53 y.o. female with a past medical history of obesity, dyslipidemia, fatty liver , chronic pain syndrome and prediabetes. . The patient has followed with Endocrinology clinic since 10/17/2018 for consultative assistance with management of her diabetes.    She was initially referred to me for concerns of adrenal insufficiency due to hyperpigmentation of the skin.  Patient has noted darkening of the skin around her chin area ~ a year ago. Since then its speading to the rest of her face, neck , inner elbows and abdominal area. No itching or burning .    She has been diagnosed with prediabetes for a few years, she is not following a certain diet nor is she able to exercise due to back pain. She also has fatty liver since at least 2009 but LFT's have been trending down  She also  Hypertriglyceridemia since 2009, currently on Pravastatin. Confirms compliance with no side effects.   Due to her chronic pain syndrome she received multiple steroid injections over the years, in 2019 she estimates received somewhere between 4-5 glucocortoid injections. Last injection was in 07/2018 in her neck.   Her salivary cortisol was high and was thought to be due to contamination. We proceeded with 24- urine cortisol was < 50 mcg ( 41.4 ) .  Another abnormal salivary cortisol test 02/2019 but turns out the pt had spinal glucocorticoid injection     SUBJECTIVE:    Today (02/23/2019): [name] is here for follow up to discuss her recent abnormal salivary cortisol test. Her weight has been stable as well as BP and glucose control .  She received a spinal glucocorticoid injection sometime at the end of august She is  limited in her physical activity due to chronic pain.  She has depression , today she is c/o anxiety.   She is c/o fatigue and pain   Had a steroid injection on 01/23/19 and prior to that 02/05/2019    ROS: As per HPI and as detailed below: Review of Systems  Cardiovascular: Negative for chest pain and palpitations.  Gastrointestinal: Negative for diarrhea and nausea.  Neurological: Negative for tingling and tremors.  Psychiatric/Behavioral: Positive for depression. The patient is nervous/anxious.       HOME DIABETES REGIMEN: Metformin 500 mg with breakfast      HISTORY:  Past Medical History:  Past Medical History:  Diagnosis Date  . Acid reflux   . Adenomatous colon polyp    2012  . Anxiety   . Constipation, chronic   . Degenerative disc disease, cervical    s/p epidural injections  . Depression   . Fatty liver disease, nonalcoholic    per ultrasound  . Fibromyalgia   . Hypercholesterolemia   . NSVD (normal spontaneous vaginal delivery)    X2  . Prediabetes    a1c 6.1  . Seizures ([place])    with dental procedure  . Vitamin D deficiency    Past Surgical History:  Past Surgical History:  Procedure Laterality Date  . BACK SURGERY  2008  . BREAST SURGERY     REDUCTIVE MAMMOPLASTY  . herniated disc    . LAPAROSCOPIC CHOLECYSTECTOMY    . LUMBAR DISC SURGERY  L-4 AND L-5   . PELVIC LAPAROSCOPY     BTSP  . TONSILLECTOMY AND ADENOIDECTOMY    . VAGINAL HYSTERECTOMY     Post Repair/TVT Sling    Social History:  reports that she has never smoked. She has never used smokeless tobacco. She reports that she does not drink alcohol or use drugs. Family History:  Family History  Problem Relation Age of Onset  . Hypertension Sister   . Cancer Sister        colon ca... stepsister  . Colon cancer Sister      HOME MEDICATIONS: Allergies as of 02/23/2019      Reactions   Latex Swelling   Other    LATEX, kiwi, papaya, fish      Medication List        Accurate as of February 23, 2019  1:07 PM. If you have any questions, ask your nurse or doctor.        atorvastatin 20 MG tablet Commonly known as: LIPITOR Take 1 tablet (20 mg total) by mouth daily.   Bath/Shower Seat Misc Use shower chair/seat while bathing   CALTRATE 600+D PO Take by mouth.   cholecalciferol 1000 units tablet Commonly known as: VITAMIN D Take 1,000 Units by mouth 2 (two) times daily.   gabapentin 100 MG capsule Commonly known as: NEURONTIN Take 200 mg by mouth at bedtime.   metFORMIN 500 MG tablet Commonly known as: GLUCOPHAGE Take 1 tablet (500 mg total) by mouth daily with breakfast.   oxyCODONE 5 MG immediate release tablet Commonly known as: Oxy IR/ROXICODONE Take 1 tablet (5 mg total) by mouth every 6 (six) hours as needed for severe pain.   pravastatin 40 MG tablet Commonly known as: PRAVACHOL TAKE 1 TABLET BY MOUTH  DAILY   tizanidine 2 MG capsule Commonly known as: ZANAFLEX Take 2 mg by mouth 3 (three) times daily.        OBJECTIVE:   Vital Signs: BP 122/80 (BP Location: Right Arm, Patient Position: Sitting, Cuff Size: Normal)   Pulse 94   Temp 98.8 F (37.1 C)   Ht 5' (1.524 m)   Wt 177 lb 3.2 oz (80.4 kg)   SpO2 98%   BMI 34.61 kg/m   Wt Readings from Last 3 Encounters:  02/23/19 177 lb 3.2 oz (80.4 kg)  01/16/19 176 lb 12.8 oz (80.2 kg)  10/17/18 181 lb 3.2 oz (82.2 kg)     Exam: General: Pt appears well and is in NAD  Lungs: Clear with good BS bilat with no rales, rhonchi, or wheezes  Heart: RRR with normal S1 and S2 and no gallops; no murmurs; no rub  Abdomen: Normoactive bowel sounds, soft, nontender, without masses or organomegaly palpable  Extremities: No pretibial edema.  Neuro: MS is good with appropriate affect, pt is alert and Ox3     DATA REVIEWED:  Lab Results  Component Value Date   HGBA1C 6.0 (A) 01/16/2019   HGBA1C 6.1 (H) 10/02/2018   HGBA1C 6.1 (H) 10/27/2017   Lab Results  Component Value  Date   LDLCALC 89 10/02/2018   CREATININE 0.53 01/16/2019    Lab Results  Component Value Date   CHOL 201 (H) 01/16/2019   HDL 55.30 01/16/2019   LDLCALC 89 10/02/2018   LDLDIRECT 121.0 01/16/2019   TRIG 246.0 (H) 01/16/2019   CHOLHDL 4 01/16/2019         ASSESSMENT / PLAN / RECOMMENDATIONS:   1) Abnormal Salivary Cortisol Testing:   -  Clinically she is stable - Pt received a couple of steroid injections in her spine in 01/2019 this is the most likely explanation for abnormal salivary cortisol testing. Pt typically doesn't;t go to bed until after 12:30 AM  - Will proceed with 24-hr collection but this has to be at least 10 weeks from the last steroid injection. - Pt will stop by in December for pick up of necessary supply for urinary collection.     Signed electronically by: Mack Guise, MD  [facility]  [facility] [address] [place], [place] [address] Phone: [phone] FAX: [phone]   CC: Rutherford Guys, MD [address] [place] [address] Phone: [phone]  Fax: [phone]  Return to Endocrinology clinic as below: Future Appointments  Date Time Provider [place]  05/15/2019  9:40 AM Rutherford Guys, MD PCP-PCP [facility]  07/19/2019 10:30 AM Rainer Mounce, Melanie Crazier, MD [facility] None

## 2019-02-23 NOTE — Patient Instructions (Signed)

## 2019-02-25 ENCOUNTER — Encounter: Payer: Self-pay | Admitting: Internal Medicine

## 2019-02-27 ENCOUNTER — Telehealth (INDEPENDENT_AMBULATORY_CARE_PROVIDER_SITE_OTHER): Payer: 59 | Admitting: Family Medicine

## 2019-02-27 DIAGNOSIS — F410 Panic disorder [episodic paroxysmal anxiety] without agoraphobia: Secondary | ICD-10-CM

## 2019-02-27 MED ORDER — HYDROXYZINE HCL 25 MG PO TABS: 12.5000 mg | ORAL_TABLET | Freq: Three times a day (TID) | ORAL | 0 refills | Status: DC | PRN

## 2019-02-27 NOTE — Progress Notes (Signed)
Virtual Visit Note  I connected with patient on 02/27/19 at 611pm by phone and verified that I am speaking with the correct person using two identifiers. [name] is currently located at home and patient is currently with them during visit. The provider, Rutherford Guys, MD is located in their office at time of visit.  I discussed the limitations, risks, security and privacy concerns of performing an evaluation and management service by telephone and the availability of in person appointments. I also discussed with the patient that there may be a patient responsible charge related to this service. The patient expressed understanding and agreed to proceed.   CC: anxiety  HPI ? Patient reports worsening of anxiety for about the past week, having recurring panic attacks She reports palpitations, clammy hands, trembling, easily startled Denies any acute changes She reports similar episodes many years ago, told it was panic attacks, lasted about 3 weeks No problems until now Back then was given xanax 0.25mg  Patient on opiates for chronic pain  Lab Results  Component Value Date   TSH 1.42 01/16/2019    Allergies  Allergen Reactions  . Latex Swelling  . Other     LATEX, kiwi, papaya, fish    Prior to Admission medications   Medication Sig Start Date End Date Taking? Authorizing Provider  atorvastatin (LIPITOR) 20 MG tablet Take 1 tablet (20 mg total) by mouth daily. 01/18/19  Yes Shamleffer, Melanie Crazier, MD  Calcium Carbonate-Vitamin D (CALTRATE 600+D PO) Take by mouth.   Yes [provider]  cholecalciferol (VITAMIN D) 1000 UNITS tablet Take 1,000 Units by mouth 2 (two) times daily.     Yes [provider]  gabapentin (NEURONTIN) 100 MG capsule Take 200 mg by mouth at bedtime.   Yes [provider]  metFORMIN (GLUCOPHAGE) 500 MG tablet Take 1 tablet (500 mg total) by mouth daily with breakfast. 01/16/19  Yes Shamleffer, Melanie Crazier,  MD  Misc. Devices (BATH/SHOWER SEAT) MISC Use shower chair/seat while bathing 05/02/18  Yes Rutherford Guys, MD  oxyCODONE (OXY IR/ROXICODONE) 5 MG immediate release tablet Take 1 tablet (5 mg total) by mouth every 6 (six) hours as needed for severe pain. 02/08/18  Yes Rutherford Guys, MD  pravastatin (PRAVACHOL) 40 MG tablet TAKE 1 TABLET BY MOUTH  DAILY 02/05/19  Yes Rutherford Guys, MD  tizanidine (ZANAFLEX) 2 MG capsule Take 2 mg by mouth 3 (three) times daily.   Yes [provider]    Past Medical History:  Diagnosis Date  . Acid reflux   . Adenomatous colon polyp    2012  . Anxiety   . Constipation, chronic   . Degenerative disc disease, cervical    s/p epidural injections  . Depression   . Fatty liver disease, nonalcoholic    per ultrasound  . Fibromyalgia   . Hypercholesterolemia   . NSVD (normal spontaneous vaginal delivery)    X2  . Prediabetes    a1c 6.1  . Seizures ([place])    with dental procedure  . Vitamin D deficiency     Past Surgical History:  Procedure Laterality Date  . BACK SURGERY  2008  . BREAST SURGERY     REDUCTIVE MAMMOPLASTY  . herniated disc    . LAPAROSCOPIC CHOLECYSTECTOMY    . LUMBAR DISC SURGERY     L-4 AND L-5   . PELVIC LAPAROSCOPY     BTSP  . TONSILLECTOMY AND ADENOIDECTOMY    . VAGINAL HYSTERECTOMY  Post Repair/TVT Sling    Social History   Tobacco Use  . Smoking status: Never Smoker  . Smokeless tobacco: Never Used  Substance Use Topics  . Alcohol use: No    Family History  Problem Relation Age of Onset  . Hypertension Sister   . Cancer Sister        colon ca... stepsister  . Colon cancer Sister     ROS Per hpi  Objective  Vitals as reported by the patient: none   ASSESSMENT and PLAN  1. Panic attacks Trial of vistaril, discussed concern for bzd and opiates. Declines counseling at this time. Consider SSRI if persistent.   Other orders - hydrOXYzine (ATARAX/VISTARIL) 25 MG tablet; Take 0.5-1  tablets (12.5-25 mg total) by mouth 3 (three) times daily as needed.  FOLLOW-UP: prn   The above assessment and management plan was discussed with the patient. The patient verbalized understanding of and has agreed to the management plan. Patient is aware to call the clinic if symptoms persist or worsen. Patient is aware when to return to the clinic for a follow-up visit. Patient educated on when it is appropriate to go to the emergency department.    I provided 11 minutes of non-face-to-face time during this encounter.  Rutherford Guys, MD [facility] [place], [place] [address] Ph.  [phone] Fax [phone]

## 2019-02-27 NOTE — Progress Notes (Signed)
Pt is having anxiety and panic attacks with body shaking. She has been on xanax meds in the past, says it really helped before. She says she stays on edge all the time and riding in cars is very difficult. Gad 7 score is 20

## 2019-04-17 ENCOUNTER — Telehealth: Payer: Self-pay

## 2019-04-17 NOTE — Telephone Encounter (Signed)
Needing office notes from May 1,2020 to present   Fax number to : [place]

## 2019-04-18 NOTE — Telephone Encounter (Signed)
Notes printed in order to be faxed.

## 2019-04-24 NOTE — Telephone Encounter (Signed)
[name] called in again stating she didn't receive fax wants you to refax information also giving a claim num,ber this time.  Claim number : [id number]

## 2019-04-24 NOTE — Telephone Encounter (Signed)
Only most recent chart not sent. If they are requesting multiple office notes, they must obtain from medical records.

## 2019-04-27 ENCOUNTER — Encounter: Payer: 59 | Admitting: Obstetrics & Gynecology

## 2019-05-15 ENCOUNTER — Encounter: Payer: 59 | Admitting: Family Medicine

## 2019-05-21 ENCOUNTER — Other Ambulatory Visit: Payer: Self-pay | Admitting: Internal Medicine

## 2019-05-22 ENCOUNTER — Other Ambulatory Visit: Payer: Self-pay

## 2019-05-24 ENCOUNTER — Other Ambulatory Visit: Payer: 59

## 2019-07-17 ENCOUNTER — Encounter: Payer: Self-pay | Admitting: Women's Health

## 2019-07-19 ENCOUNTER — Ambulatory Visit: Payer: 59 | Admitting: Internal Medicine

## 2019-07-24 ENCOUNTER — Other Ambulatory Visit: Payer: Self-pay

## 2019-07-26 ENCOUNTER — Other Ambulatory Visit: Payer: Self-pay

## 2019-07-26 DIAGNOSIS — R6889 Other general symptoms and signs: Secondary | ICD-10-CM

## 2019-07-27 ENCOUNTER — Encounter: Payer: Self-pay | Admitting: Internal Medicine

## 2019-07-30 LAB — CORTISOL, URINE, 24 HOUR
24 Hour urine volume (VMAHVA): 3100 mL
Cortisol (Ur), Free: 5 mcg/24 h (ref 4.0–50.0)
RESULTS RECEIVED: 1.12 g/(24.h) (ref 0.50–2.15)

## 2019-08-07 ENCOUNTER — Other Ambulatory Visit: Payer: Self-pay

## 2019-08-08 ENCOUNTER — Ambulatory Visit (INDEPENDENT_AMBULATORY_CARE_PROVIDER_SITE_OTHER): Payer: 59 | Admitting: Women's Health

## 2019-08-08 ENCOUNTER — Encounter: Payer: Self-pay | Admitting: Women's Health

## 2019-08-08 VITALS — BP 124/80 | Ht 60.0 in | Wt 178.0 lb

## 2019-08-08 DIAGNOSIS — R300 Dysuria: Secondary | ICD-10-CM

## 2019-08-08 DIAGNOSIS — Z13820 Encounter for screening for osteoporosis: Secondary | ICD-10-CM

## 2019-08-08 DIAGNOSIS — Z01419 Encounter for gynecological examination (general) (routine) without abnormal findings: Secondary | ICD-10-CM

## 2019-08-08 NOTE — Addendum Note (Signed)
Addended by: Lorine Bears on: 08/08/2019 09:57 AM   Modules accepted: Orders

## 2019-08-08 NOTE — Patient Instructions (Addendum)
Vitamin D 2000 IUs daily Walk daily 30 minutes Mantenimiento de la salud en las [employer], Female Adoptar un estilo de vida saludable y recibir atencin preventiva son importantes para promover la salud y Musician. Consulte al mdico sobre:  El esquema adecuado para hacerse pruebas y exmenes peridicos.  Cosas que puede hacer por su cuenta para prevenir enfermedades y [employer]. Qu debo saber sobre la dieta, el peso y el ejercicio? Consuma una dieta saludable   Consuma una dieta que incluya muchas verduras, frutas, productos lcteos con bajo contenido de [place] y Advertising account planner.  No consuma muchos alimentos ricos en grasas slidas, azcares agregados o sodio. Mantenga un peso saludable El ndice de masa muscular [facility]) se [place] para identificar problemas de [place]. Proporciona una estimacin de la grasa corporal basndose en el peso y la altura. Su mdico puede ayudarle a Radiation protection practitioner [place] y a Scientist, forensic o Theatre manager un peso saludable. Haga ejercicio con regularidad Haga ejercicio con regularidad. Esta es una de las prcticas ms importantes que puede hacer por su salud. La mayora de los adultos deben seguir estas pautas:  Optometrist, al menos, 155minutos de actividad fsica por semana. El ejercicio debe aumentar la frecuencia cardaca y Nature conservation officer transpirar (ejercicio de intensidad moderada).  Hacer ejercicios de fortalecimiento por lo [employer] por semana. Agregue esto a su plan de ejercicio de intensidad moderada.  Pasar menos tiempo sentados. Incluso la actividad fsica ligera puede ser beneficiosa. Controle sus niveles de colesterol y lpidos en la sangre Comience a realizarse anlisis de lpidos y Research officer, trade union en la sangre a los 20aos y luego reptalos cada 5aos. Hgase controlar los niveles de colesterol con mayor frecuencia si:  Sus niveles de lpidos y colesterol son altos.  Es mayor de 40aos.  Presenta un alto riesgo de padecer enfermedades  cardacas. Qu debo saber sobre las pruebas de deteccin del cncer? Segn su historia clnica y sus antecedentes familiares, es posible que deba realizarse pruebas de deteccin del cncer en diferentes edades. Esto puede incluir pruebas de deteccin de lo siguiente:  Cncer de mama.  Cncer de cuello uterino.  Cncer colorrectal.  Cncer de piel.  Cncer de pulmn. Qu debo saber sobre la enfermedad cardaca, la diabetes y la hipertensin arterial? Presin arterial y enfermedad cardaca  La hipertensin arterial causa enfermedades cardacas y [place] el riesgo de accidente cerebrovascular. Es ms probable que esto se manifieste en las personas que tienen lecturas de presin arterial alta, tienen ascendencia africana o tienen sobrepeso.  Hgase controlar la presin arterial: ? Cada 3 a 5 aos si tiene entre 18 y 57 aos. ? Todos los aos si es mayor de [place]. Diabetes Realcese exmenes de deteccin de la diabetes con regularidad. Este anlisis revisa el nivel de azcar en la sangre en [place]. Hgase las pruebas de deteccin:  Cada tresaos despus de los 37aos de edad si tiene un peso normal y un bajo riesgo de padecer diabetes.  Con ms frecuencia y a partir de [place] edad inferior si tiene sobrepeso o un alto riesgo de padecer diabetes. Qu debo saber sobre la prevencin de infecciones? Hepatitis B Si tiene un riesgo ms alto de contraer hepatitis B, debe someterse a un examen de deteccin de este virus. Hable con el mdico para averiguar si tiene riesgo de contraer la infeccin por hepatitis B. Hepatitis C Se recomienda el anlisis a:  [employer] 1945 y 1965.  Todas las personas que tengan un riesgo de haber contrado hepatitis  C. Enfermedades de transmisin sexual (ETS)  Hgase las pruebas de deteccin de ITS, incluidas la gonorrea y la clamidia, si: ? Es sexualmente activa y es menor de 24aos. ? Es mayor de 24aos, y Investment banker, operational informa que  corre riesgo de tener este tipo de infecciones. ? La actividad sexual ha cambiado desde que le hicieron la ltima prueba de deteccin y tiene un riesgo mayor de Best boy clamidia o Radio broadcast assistant. Pregntele al mdico si usted tiene riesgo.  Pregntele al mdico si usted tiene un alto riesgo de Museum/gallery curator VIH. El mdico tambin puede recomendarle un medicamento recetado para ayudar a evitar la infeccin por el VIH. Si elige tomar medicamentos para prevenir el VIH, primero debe [employer] de deteccin del VIH. Luego debe hacerse anlisis cada 62meses mientras est tomando los medicamentos. Embarazo  Si est por dejar de Librarian, academic (fase premenopusica) y usted puede quedar [place], busque asesoramiento antes de [place].  Tome de 400 a [id number] (mcg) de cido [employer] si [place].  Pida mtodos de control de la natalidad (anticonceptivos) si desea evitar un embarazo no deseado. Osteoporosis y [place] La osteoporosis es una enfermedad en la que los huesos pierden los minerales y la fuerza por el avance de la edad. El resultado pueden ser fracturas en los [place]. Si tiene 65aos o ms, o si est en riesgo de sufrir osteoporosis y fracturas, pregunte a su mdico si debe:  Hacerse pruebas de deteccin de prdida sea.  Tomar un suplemento de calcio o de vitamina D para reducir el riesgo de fracturas.  Recibir terapia de reemplazo hormonal (TRH) para tratar los sntomas de la menopausia. Siga estas instrucciones en su casa: Estilo de vida  No consuma ningn producto que contenga nicotina o tabaco, como cigarrillos, cigarrillos electrnicos y tabaco de Higher education careers adviser. Si necesita ayuda para dejar de fumar, consulte al mdico.  No consuma drogas.  No comparta agujas.  Solicite ayuda a su mdico si necesita apoyo o informacin para abandonar las drogas. Consumo de alcohol  No beba alcohol si: ? Su mdico le indica no hacerlo. ? Est embarazada, puede estar  embarazada o est tratando de quedar embarazada.  Si bebe alcohol: ? Limite la cantidad que consume de 0 a 1 medida por da. ? Limite la ingesta si est amamantando.  Est atento a la cantidad de alcohol que hay en las bebidas que toma. En los [place], una medida equivale a una botella de cerveza de 12oz (346ml), un vaso de vino de 5oz (127ml) o un vaso de una bebida alcohlica de alta graduacin de 1oz (26ml). Instrucciones generales  Realcese los estudios de rutina de la salud, dentales y de Public librarian.  [place].  Infrmele a su mdico si: ? Se siente deprimida con frecuencia. ? Alguna vez ha sido vctima de [place] o no se siente segura en su casa. Resumen  Adoptar un estilo de vida saludable y recibir atencin preventiva son importantes para promover la salud y Musician.  Siga las instrucciones del mdico acerca de una dieta saludable, el ejercicio y la realizacin de pruebas o exmenes para Engineer, building services.  Siga las instrucciones del mdico con respecto al control del colesterol y la presin arterial. Esta informacin no tiene Marine scientist el consejo del mdico. Asegrese de hacerle al mdico cualquier pregunta que tenga. Document Revised: 06/21/2018 Document Reviewed: 06/21/2018 Elsevier Patient Education  [place].

## 2019-08-08 NOTE — Progress Notes (Signed)
[name] [name] 05-03-1966 ID:[id number]    History:    Presents for annual exam.  2005 TVH for fibroids normal Pap history.  2008 breast reduction normal mammogram history normal mammogram last month at [facility] report not in chart patient reports.  Spanish-speaking interpreter present able to speak some [place].  2012 adenomatous polyp 2017 - colonoscopy 5-year follow-up.  Biggest problem is degenerative disc disease has had 3 surgeries in the last 2 years and has continued chronic back pain is now on disability.  Also has fibromyalgia.  Husband prostate cancer/ not sexually active.  Primary care manages diabetes and hypercholesterolemia.  Past medical history, past surgical history, family history and social history were all reviewed and documented in the EPIC chart.  Son 31, daughter 34 and has 2 granddaughters both doing well.  ROS:  A ROS was performed and pertinent positives and negatives are included.  Exam:  Vitals:   08/08/19 0857  BP: 124/80  Weight: 178 lb (80.7 kg)  Height: 5' (1.524 m)   Body mass index is 34.76 kg/m.   General appearance:  Normal Thyroid:  Symmetrical, normal in size, without palpable masses or nodularity. Respiratory  Auscultation:  Clear without wheezing or rhonchi Cardiovascular  Auscultation:  Regular rate, without rubs, murmurs or gallops  Edema/varicosities:  Not grossly evident Abdominal  Soft,nontender, without masses, guarding or rebound.  Liver/spleen:  No organomegaly noted  Hernia:  None appreciated  Skin  Inspection:  Grossly normal   Breasts: Examined lying and sitting. Well-healed bilateral breast reduction scars    Right: Without masses, retractions, discharge or axillary adenopathy.     Left: Without masses, retractions, discharge or axillary adenopathy. Gentitourinary   Inguinal/mons:  Normal without inguinal adenopathy  External genitalia:  Normal  BUS/Urethra/Skene's glands:  Normal  Vagina:  Normal  Cervix: And  uterus absent   Adnexa/parametria:     Rt: Without masses or tenderness.   Lt: Without masses or tenderness.  Anus and perineum: Normal   Digital rectal exam: Normal sphincter tone without palpated masses or tenderness  Assessment/Plan:  54 y.o. M HF G2, P2 for annual exam with no complaints of vaginal discharge, abdominal pain reports occasional urinary frequency..    2005 TVH for fibroids Degenerative disc disease chronic pain on disability Obesity Hypertension, hypercholesteremia primary care manages labs and meds Melasma-dermatology follow-up  Plan: Schedule DEXA, encouraged chair yoga, increase exercise as able, decrease calorie/carbs encouraged.  Home safety, fall prevention discussed.   SBEs, continue annual 3D screening mammogram and have report sent to our office.  Vitamin D 2000 IUs daily encouraged.  Pap per request reviewed new screening guidelines.  UA pending   [place], 9:30 AM 08/08/2019  9:30 AM

## 2019-08-09 LAB — PAP IG W/ RFLX HPV ASCU

## 2019-08-11 LAB — URINALYSIS, COMPLETE W/RFL CULTURE
Bilirubin Urine: NEGATIVE
Glucose, UA: NEGATIVE
Hyaline Cast: NONE SEEN /LPF
Ketones, ur: NEGATIVE
Nitrites, Initial: NEGATIVE
Protein, ur: NEGATIVE
RBC / HPF: NONE SEEN /HPF (ref 0–2)
Specific Gravity, Urine: 1.02 (ref 1.001–1.03)
pH: 6 (ref 5.0–8.0)

## 2019-08-11 LAB — URINE CULTURE
MICRO NUMBER:: 10183280
SPECIMEN QUALITY:: ADEQUATE

## 2019-08-11 LAB — CULTURE INDICATED

## 2019-08-14 ENCOUNTER — Other Ambulatory Visit: Payer: Self-pay | Admitting: Anesthesiology

## 2019-08-14 MED ORDER — NITROFURANTOIN MONOHYD MACRO 100 MG PO CAPS: 100.0000 mg | ORAL_CAPSULE | Freq: Two times a day (BID) | ORAL | 0 refills | Status: DC

## 2019-08-17 ENCOUNTER — Other Ambulatory Visit: Payer: Self-pay

## 2019-08-17 MED ORDER — SULFAMETHOXAZOLE-TRIMETHOPRIM 800-160 MG PO TABS: 1.0000 | ORAL_TABLET | Freq: Two times a day (BID) | ORAL | 0 refills | Status: DC

## 2019-08-17 NOTE — Progress Notes (Signed)
, °

## 2019-08-17 NOTE — Telephone Encounter (Signed)
[place] result note on 08/08/19 urine culture result "Please call and review urine culture pos for sm amt of bacteria, if any symptoms of uti, macrobid 100 mg bid for 7 days take with food."

## 2019-08-28 ENCOUNTER — Other Ambulatory Visit: Payer: Self-pay

## 2019-08-28 ENCOUNTER — Encounter: Payer: Self-pay | Admitting: Family Medicine

## 2019-08-28 ENCOUNTER — Ambulatory Visit (INDEPENDENT_AMBULATORY_CARE_PROVIDER_SITE_OTHER): Payer: 59 | Admitting: Family Medicine

## 2019-08-28 VITALS — BP 131/77 | HR 98 | Temp 97.8°F | Resp 15 | Ht 60.0 in | Wt 180.0 lb

## 2019-08-28 DIAGNOSIS — E782 Mixed hyperlipidemia: Secondary | ICD-10-CM

## 2019-08-28 DIAGNOSIS — R7303 Prediabetes: Secondary | ICD-10-CM | POA: Diagnosis not present

## 2019-08-28 DIAGNOSIS — K219 Gastro-esophageal reflux disease without esophagitis: Secondary | ICD-10-CM

## 2019-08-28 DIAGNOSIS — E559 Vitamin D deficiency, unspecified: Secondary | ICD-10-CM

## 2019-08-28 DIAGNOSIS — K5903 Drug induced constipation: Secondary | ICD-10-CM | POA: Diagnosis not present

## 2019-08-28 LAB — CMP14+EGFR
ALT: 49 IU/L — ABNORMAL HIGH (ref 0–32)
AST: 37 IU/L (ref 0–40)
Albumin/Globulin Ratio: 1.4 (ref 1.2–2.2)
Albumin: 4.7 g/dL (ref 3.8–4.9)
Alkaline Phosphatase: 110 IU/L (ref 39–117)
BUN/Creatinine Ratio: 13 (ref 9–23)
BUN: 9 mg/dL (ref 6–24)
Bilirubin Total: 0.5 mg/dL (ref 0.0–1.2)
CO2: 24 mmol/L (ref 20–29)
Calcium: 10.3 mg/dL — ABNORMAL HIGH (ref 8.7–10.2)
Chloride: 102 mmol/L (ref 96–106)
Creatinine, Ser: 0.72 mg/dL (ref 0.57–1.00)
GFR calc Af Amer: 110 mL/min/{1.73_m2} (ref >59)
GFR calc non Af Amer: 95 mL/min/{1.73_m2} (ref >59)
Globulin, Total: 3.3 g/dL (ref 1.5–4.5)
Glucose: 95 mg/dL (ref 65–99)
Potassium: 4.4 mmol/L (ref 3.5–5.2)
Sodium: 140 mmol/L (ref 134–144)
Total Protein: 8 g/dL (ref 6.0–8.5)

## 2019-08-28 LAB — HEMOGLOBIN A1C
Est. average glucose Bld gHb Est-mCnc: 128 mg/dL
Hgb A1c MFr Bld: 6.1 % — ABNORMAL HIGH (ref 4.8–5.6)

## 2019-08-28 LAB — LIPID PANEL
Chol/HDL Ratio: 3.8 ratio (ref 0.0–4.4)
Cholesterol, Total: 196 mg/dL (ref 100–199)
HDL: 51 mg/dL (ref >39)
LDL Chol Calc (NIH): 92 mg/dL (ref 0–99)
Triglycerides: 322 mg/dL — ABNORMAL HIGH (ref 0–149)
VLDL Cholesterol Cal: 53 mg/dL — ABNORMAL HIGH (ref 5–40)

## 2019-08-28 MED ORDER — LACTULOSE 10 GM/15ML PO SOLN: 10.0000 g | Freq: Two times a day (BID) | ORAL | 5 refills | Status: DC | PRN

## 2019-08-28 MED ORDER — FAMOTIDINE 20 MG PO TABS: 20.0000 mg | ORAL_TABLET | Freq: Two times a day (BID) | ORAL | 1 refills | Status: DC

## 2019-08-28 NOTE — Progress Notes (Signed)
3/16/202110:59 AM  [name] 03/10/1966, 54 y.o., female [id number]  Chief Complaint  Patient presents with  . Diabetes    pt has tried to work on diet pt has been taking BS at home most recent reading 88 from last week, pt notes numbness and tingling in her feet  . Hyperlipidemia    pt has tried to work on diet but has not had much luck notes she has gained weight.    HPI:   Patient is a 54 y.o. female with past medical history significant for fibromyalgia, cervical radiculopathy, SI dysfunction, lumbosacral radiculopathy, s/p multiple spine surgeries, pre-diabetes, fatty liver who presents today forroutine followup  Last OV sept 2020  Was seeing endo given concerns for cushingoid facies - normal workup Takes vitamin D 3000 units a day Has gained about 5 lbs despite no changes in diet She was placed on disability by pain mgt Has new insurance therefore needs new referral for pain mgt As will not be covered under new insurance Having chronic constipation, miralax and stool softners not helping Requestig medication for gerd used to be on omeprazole  Lab Results  Component Value Date   HGBA1C 6.0 (A) 01/16/2019   HGBA1C 6.1 (H) 10/02/2018   HGBA1C 6.1 (H) 10/27/2017   Lab Results  Component Value Date   LDLCALC 89 10/02/2018   CREATININE 0.53 01/16/2019    Depression screen PHQ 2/9 08/28/2019 05/23/2018 04/04/2018  Decreased Interest 1 0 0  Down, Depressed, Hopeless 1 0 0  PHQ - 2 Score 2 0 0  Altered sleeping 2 - -  Tired, decreased energy 3 - -  Change in appetite 1 - -  Feeling bad or failure about yourself  0 - -  Trouble concentrating 1 - -  Moving slowly or fidgety/restless 0 - -  Suicidal thoughts 0 - -  PHQ-9 Score 9 - -  Difficult doing work/chores - - -    Fall Risk  08/28/2019 02/27/2019 05/23/2018 05/02/2018 04/04/2018  Falls in the past year? 0 0 0 0 No  Number falls in past yr: - 0 - - -  Injury with Fall? - 0 - - -  Follow up  Falls evaluation completed - - - -     Allergies  Allergen Reactions  . Latex Swelling  . Other     LATEX, kiwi, papaya, fish  . Macrodantin [Nitrofurantoin] Rash and Other (See Comments)    Rash, pain behind eyes    Prior to Admission medications   Medication Sig Start Date End Date Taking? Authorizing Provider  atorvastatin (LIPITOR) 20 MG tablet Take 1 tablet (20 mg total) by mouth daily. 01/18/19  Yes Shamleffer, Melanie Crazier, MD  Calcium Carbonate-Vitamin D (CALTRATE 600+D PO) Take by mouth.   Yes [provider]  cholecalciferol (VITAMIN D) 1000 UNITS tablet Take 1,000 Units by mouth 2 (two) times daily.     Yes [provider]  gabapentin (NEURONTIN) 100 MG capsule Take 200 mg by mouth at bedtime.   Yes [provider]  hydrOXYzine (ATARAX/VISTARIL) 25 MG tablet Take 0.5-1 tablets (12.5-25 mg total) by mouth 3 (three) times daily as needed. 02/27/19  Yes Rutherford Guys, MD  metFORMIN (GLUCOPHAGE) 500 MG tablet Take 1 tablet (500 mg total) by mouth daily with breakfast. 01/16/19  Yes Shamleffer, Melanie Crazier, MD  Misc. Devices (BATH/SHOWER SEAT) MISC Use shower chair/seat while bathing 05/02/18  Yes Rutherford Guys, MD  oxyCODONE (OXY IR/ROXICODONE) 5 MG immediate release tablet  Take 1 tablet (5 mg total) by mouth every 6 (six) hours as needed for severe pain. 02/08/18  Yes Rutherford Guys, MD  sulfamethoxazole-trimethoprim (BACTRIM DS) 800-160 MG tablet Take 1 tablet by mouth 2 (two) times daily. 08/17/19  Yes Princess Bruins, MD    Past Medical History:  Diagnosis Date  . Acid reflux   . Adenomatous colon polyp    2012  . Anxiety   . Constipation, chronic   . Degenerative disc disease, cervical    s/p epidural injections  . Depression   . Fatty liver disease, nonalcoholic    per ultrasound  . Fibromyalgia   . Hypercholesterolemia   . NSVD (normal spontaneous vaginal delivery)    X2  . Prediabetes    a1c 6.1  . Seizures ([place])    with  dental procedure  . Vitamin D deficiency     Past Surgical History:  Procedure Laterality Date  . BACK SURGERY  2008  . BREAST SURGERY     REDUCTIVE MAMMOPLASTY  . herniated disc    . LAPAROSCOPIC CHOLECYSTECTOMY    . LUMBAR DISC SURGERY     L-4 AND L-5   . PELVIC LAPAROSCOPY     BTSP  . TONSILLECTOMY AND ADENOIDECTOMY    . VAGINAL HYSTERECTOMY     Post Repair/TVT Sling    Social History   Tobacco Use  . Smoking status: Never Smoker  . Smokeless tobacco: Never Used  Substance Use Topics  . Alcohol use: No    Family History  Problem Relation Age of Onset  . Hypertension Sister   . Cancer Sister        colon ca... stepsister  . Colon cancer Sister     Review of Systems  Constitutional: Negative for chills and fever.  Respiratory: Negative for cough and shortness of breath.   Cardiovascular: Negative for chest pain, palpitations and leg swelling.  Gastrointestinal: Positive for constipation and heartburn. Negative for abdominal pain, blood in stool, melena, nausea and vomiting.  Musculoskeletal: Positive for back pain, joint pain, myalgias and neck pain.  Neurological: Positive for tingling. Negative for focal weakness.   Per hpi  OBJECTIVE:  Today's Vitals   08/28/19 1049  BP: 131/77  Pulse: 98  Resp: 15  Temp: 97.8 F (36.6 C)  TempSrc: Temporal  SpO2: 100%  Weight: 180 lb (81.6 kg)  Height: 5' (1.524 m)   Body mass index is 35.15 kg/m.   Wt Readings from Last 3 Encounters:  08/28/19 180 lb (81.6 kg)  08/08/19 178 lb (80.7 kg)  02/23/19 177 lb 3.2 oz (80.4 kg)     Physical Exam Vitals and nursing note reviewed.  Constitutional:      Appearance: She is well-developed.  HENT:     Head: Normocephalic and atraumatic.     Mouth/Throat:     Pharynx: No oropharyngeal exudate.  Eyes:     General: No scleral icterus.    Conjunctiva/sclera: Conjunctivae normal.     Pupils: Pupils are equal, round, and reactive to light.  Cardiovascular:      Rate and Rhythm: Normal rate and regular rhythm.     Heart sounds: Normal heart sounds. No murmur. No friction rub. No gallop.   Pulmonary:     Effort: Pulmonary effort is normal.     Breath sounds: Normal breath sounds. No wheezing or rales.  Musculoskeletal:     Cervical back: Neck supple.  Skin:    General: Skin is warm and dry.  Neurological:  Mental Status: She is alert and oriented to person, place, and time.     No results found for this or any previous visit (from the past 24 hour(s)).  No results found.   ASSESSMENT and PLAN  1. Mixed hyperlipidemia Checking labs today, medications will be adjusted as needed. Cont with LFM - Lipid panel - CMP14+EGFR  2. Prediabetes Checking labs today, medications will be adjusted as needed. Cont with LFM - Hemoglobin A1c  3. Vitamin D deficiency Checking labs today, medications will be adjusted as needed.  - Vitamin D, 25-hydroxy  4. Drug-induced constipation Trial of lactulose. 2/2 chronic opiate use  5. Gastroesophageal reflux disease without esophagitis Trial of famotidine  Other orders - lactulose (CHRONULAC) 10 GM/15ML solution; Take 15 mLs (10 g total) by mouth 2 (two) times daily as needed for mild constipation. - famotidine (PEPCID) 20 MG tablet; Take 1 tablet (20 mg total) by mouth 2 (two) times daily.  Return in about 6 months (around 02/28/2020).    Rutherford Guys, MD Primary Care at [place], [place] [address] Ph.  [phone] Fax [phone]

## 2019-08-28 NOTE — Patient Instructions (Signed)
° ° ° °  If you have lab work done today you will be contacted with your lab results within the next 2 weeks.  If you have not heard from us then please contact us. The fastest way to get your results is to register for My Chart. ° ° °IF you received an x-ray today, you will receive an invoice from [facility]. Please contact [facility] at [phone] with questions or concerns regarding your invoice.  ° °IF you received labwork today, you will receive an invoice from [facility]. Please contact [facility] at [phone] with questions or concerns regarding your invoice.  ° °Our billing staff will not be able to assist you with questions regarding bills from these companies. ° °You will be contacted with the lab results as soon as they are available. The fastest way to get your results is to activate your My Chart account. Instructions are located on the last page of this paperwork. If you have not heard from us regarding the results in 2 weeks, please contact this office. °  ° ° ° °

## 2019-08-29 LAB — VITAMIN D 25 HYDROXY (VIT D DEFICIENCY, FRACTURES): Vit D, 25-Hydroxy: 38.3 ng/mL (ref 30.0–100.0)

## 2019-09-06 ENCOUNTER — Other Ambulatory Visit: Payer: Self-pay | Admitting: Obstetrics & Gynecology

## 2019-09-06 ENCOUNTER — Other Ambulatory Visit: Payer: Self-pay

## 2019-09-06 ENCOUNTER — Encounter (INDEPENDENT_AMBULATORY_CARE_PROVIDER_SITE_OTHER): Payer: 59

## 2019-09-06 DIAGNOSIS — Z780 Asymptomatic menopausal state: Secondary | ICD-10-CM | POA: Diagnosis not present

## 2019-09-06 DIAGNOSIS — M85851 Other specified disorders of bone density and structure, right thigh: Secondary | ICD-10-CM

## 2019-09-06 DIAGNOSIS — M8588 Other specified disorders of bone density and structure, other site: Secondary | ICD-10-CM | POA: Diagnosis not present

## 2019-09-10 ENCOUNTER — Encounter: Payer: Self-pay | Admitting: Family Medicine

## 2019-09-10 DIAGNOSIS — M549 Dorsalgia, unspecified: Secondary | ICD-10-CM

## 2019-09-10 DIAGNOSIS — G894 Chronic pain syndrome: Secondary | ICD-10-CM

## 2019-09-10 DIAGNOSIS — M5416 Radiculopathy, lumbar region: Secondary | ICD-10-CM

## 2019-09-10 DIAGNOSIS — Z9889 Other specified postprocedural states: Secondary | ICD-10-CM

## 2019-09-10 DIAGNOSIS — M5010 Cervical disc disorder with radiculopathy, unspecified cervical region: Secondary | ICD-10-CM

## 2019-09-13 ENCOUNTER — Other Ambulatory Visit: Payer: Self-pay | Admitting: Family Medicine

## 2019-09-13 DIAGNOSIS — M5010 Cervical disc disorder with radiculopathy, unspecified cervical region: Secondary | ICD-10-CM

## 2019-09-13 DIAGNOSIS — M5417 Radiculopathy, lumbosacral region: Secondary | ICD-10-CM

## 2019-09-13 DIAGNOSIS — Z9889 Other specified postprocedural states: Secondary | ICD-10-CM

## 2019-09-13 DIAGNOSIS — M549 Dorsalgia, unspecified: Secondary | ICD-10-CM

## 2019-09-13 DIAGNOSIS — G894 Chronic pain syndrome: Secondary | ICD-10-CM

## 2019-09-17 ENCOUNTER — Encounter: Payer: Self-pay | Admitting: Family Medicine

## 2019-09-17 ENCOUNTER — Telehealth: Payer: Self-pay | Admitting: Family Medicine

## 2019-09-17 DIAGNOSIS — M549 Dorsalgia, unspecified: Secondary | ICD-10-CM

## 2019-09-17 DIAGNOSIS — M5410 Radiculopathy, site unspecified: Secondary | ICD-10-CM

## 2019-09-17 DIAGNOSIS — G894 Chronic pain syndrome: Secondary | ICD-10-CM

## 2019-09-17 DIAGNOSIS — M5030 Other cervical disc degeneration, unspecified cervical region: Secondary | ICD-10-CM

## 2019-09-17 DIAGNOSIS — Z9889 Other specified postprocedural states: Secondary | ICD-10-CM

## 2019-09-17 NOTE — Telephone Encounter (Signed)
Dr. Pamella Pert we received a message back from [facility] that Dr. Arlice Colt does not do pain mgmt anymore.  Is there another specific pain mgmt provider you would like to use?

## 2019-09-17 NOTE — Telephone Encounter (Signed)
Message in [place], Dr. Pamella Pert, [place] doctor que me habia referido dice que no es pain managment,  entonces llame al seguro medico y me dieron Actor. Dorene Ar MD. phone [phone] FAX # [phone]. me refiere a este doctor por favor me deja saber, gracias. Rhaya [name].

## 2019-09-18 ENCOUNTER — Encounter: Payer: Self-pay | Admitting: Family Medicine

## 2019-09-18 NOTE — Telephone Encounter (Signed)
Dr. Arlice Colt no longer does Pain management is there someone else you would prefer the patient see?

## 2019-09-18 NOTE — Telephone Encounter (Signed)
Message in [place], Dr. Fenton Foy Fordyce un e-mail bien temprano Canary Brim, me deja saber. y le deje copia del nuevo seguro medico  en la reception. Gracias Emanuela [name].

## 2019-09-18 NOTE — Telephone Encounter (Signed)
Message in [place], [place] siento mucho decirle que Dr. Hortense Ramal no esta en [place]. yo no [facility] [id number]. efectivo April 1.  voy a llevar la tarjeta hoy para que la [place]. entonces el Dr. Vevelyn Francois O'toole, si eta en mi Network. phone#[phone]. [phone]. disculpe fue mi error no informar correctament. le pido que me transfiera con el.  Lesia Hausen. Lejla [name]

## 2019-09-18 NOTE — Telephone Encounter (Signed)
Patient's concern/request has been addressed already. 

## 2019-10-17 ENCOUNTER — Encounter: Payer: Self-pay | Admitting: Internal Medicine

## 2019-11-19 ENCOUNTER — Encounter: Payer: Self-pay | Admitting: Internal Medicine

## 2019-11-19 ENCOUNTER — Ambulatory Visit (INDEPENDENT_AMBULATORY_CARE_PROVIDER_SITE_OTHER): Payer: 59 | Admitting: Internal Medicine

## 2019-11-19 VITALS — BP 122/78 | HR 100 | Ht 60.0 in | Wt 181.0 lb

## 2019-11-19 DIAGNOSIS — K219 Gastro-esophageal reflux disease without esophagitis: Secondary | ICD-10-CM

## 2019-11-19 DIAGNOSIS — R1013 Epigastric pain: Secondary | ICD-10-CM

## 2019-11-19 DIAGNOSIS — Z86010 Personal history of colonic polyps: Secondary | ICD-10-CM | POA: Diagnosis not present

## 2019-11-19 DIAGNOSIS — Z800 Family history of malignant neoplasm of digestive organs: Secondary | ICD-10-CM | POA: Diagnosis not present

## 2019-11-19 DIAGNOSIS — K5904 Chronic idiopathic constipation: Secondary | ICD-10-CM | POA: Diagnosis not present

## 2019-11-19 MED ORDER — SUTAB 1479-225-188 MG PO TABS: 1.0000 | ORAL_TABLET | Freq: Once | ORAL | 0 refills | Status: AC

## 2019-11-19 NOTE — Progress Notes (Signed)
HISTORY OF PRESENT ILLNESS:  [name] is a 54 y.o. female, disabled due to chronic back pain for which she is on narcotics, who presents today regarding GERD, epigastric pain, chronic constipation, and the need for surveillance colonoscopy.  Previous patient of Dr. Erskine Emery whom I saw on 1 occasion June 01, 2011 performed colonoscopy and upper endoscopy, in his absence.  Colonoscopy was being performed due to Hemoccult positive stool, family history of colon cancer and a half sibling age 55s.  Previous examination 2010 was negative for neoplasia.  The examination 2012 revealed a normal terminal ileum.  Mild sigmoid diverticulosis.  Diminutive colon polyp of the hepatic flexure which was a tubular adenoma.  Otherwise unremarkable.  Follow-up in 5 years recommended.  Patient has not had a follow-up exam to date.  Upper endoscopy to evaluate GERD at bay dysphagia.  This was normal.  Patient tells me that she has been having some problems with intermittent epigastric pain.  She also notices sounds similar to those with drifting [place].  For GERD she takes Pepcid.  No dysphagia.  She does have chronic constipation for which she takes MiraLAX in the morning and stool softener at night.  This is somewhat helpful, but not entirely.  No bleeding.  Review of blood work from March 2021 shows ALT of 49.  Otherwise normal liver tests.  She does have a history of fibromyalgia and anxiety.  She is status post laparoscopic cholecystectomy.  She has completed her Covid vaccination series  REVIEW OF SYSTEMS:  All non-GI ROS negative unless otherwise stated in the HPI except for anxiety, arthritis, back pain, vision change  Past Medical History:  Diagnosis Date  . Acid reflux   . Adenomatous colon polyp    2012  . Anxiety   . Constipation, chronic   . Degenerative disc disease, cervical    s/p epidural injections  . Depression   . Fatty liver disease, nonalcoholic    per ultrasound  .  Fibromyalgia   . Hypercholesterolemia   . NSVD (normal spontaneous vaginal delivery)    X2  . Prediabetes    a1c 6.1  . Seizures ([place])    with dental procedure  . Vitamin D deficiency     Past Surgical History:  Procedure Laterality Date  . BACK SURGERY  2008  . BREAST SURGERY     REDUCTIVE MAMMOPLASTY  . herniated disc    . LAPAROSCOPIC CHOLECYSTECTOMY    . LUMBAR DISC SURGERY     L-4 AND L-5   . PELVIC LAPAROSCOPY     BTSP  . TONSILLECTOMY AND ADENOIDECTOMY    . VAGINAL HYSTERECTOMY     Post Repair/TVT Sling    Social History [name]  reports that she has never smoked. She has never used smokeless tobacco. She reports that she does not drink alcohol or use drugs.  family history includes Cancer in her sister; Colon cancer in her sister; Hypertension in her sister.  Allergies  Allergen Reactions  . Latex Swelling  . Other     LATEX, kiwi, papaya, fish  . Macrodantin [Nitrofurantoin] Rash and Other (See Comments)    Rash, pain behind eyes       PHYSICAL EXAMINATION: Vital signs: BP 122/78   Pulse 100   Ht 5' (1.524 m)   Wt 181 lb (82.1 kg)   BMI 35.35 kg/m   Constitutional: generally well-appearing but overweight, no acute distress Psychiatric: alert and oriented x3, cooperative Eyes: extraocular movements intact, anicteric,  conjunctiva pink Mouth: oral pharynx moist, no lesions Neck: supple no lymphadenopathy Cardiovascular: heart regular rate and rhythm, no murmur Lungs: clear to auscultation bilaterally Abdomen: soft, nontender, nondistended, no obvious ascites, no peritoneal signs, normal bowel sounds, no organomegaly Rectal: Deferred until colonoscopy Extremities: no clubbing, cyanosis, or lower extremity edema bilaterally Skin: no lesions on visible extremities Neuro: No focal deficits.  Cranial nerves intact  ASSESSMENT:  1.  Family history of colon cancer personal history of adenomatous colon polyps.  Overdue for  surveillance 2.  Functional constipation.  Ongoing 3.  Chronic GERD.  On famotidine 4.  Epigastric pain status post cholecystectomy.  May be incompletely treated GERD.  Rule out ulcer disease 5.  Obesity 6.  Mild elevation of ALT.  Likely fatty liver  PLAN:  1.  Reflux precautions 2.  Exercise and weight loss (exercise somewhat limited due to chronic back issues) 3.  Continue famotidine 4.  Schedule upper endoscopy to evaluate epigastric pain chronic GERD.The nature of the procedure, as well as the risks, benefits, and alternatives were carefully and thoroughly reviewed with the patient. Ample time for discussion and questions allowed. The patient understood, was satisfied, and agreed to proceed. 5.  Schedule colonoscopy for surveillance purposes.The nature of the procedure, as well as the risks, benefits, and alternatives were carefully and thoroughly reviewed with the patient. Ample time for discussion and questions allowed. The patient understood, was satisfied, and agreed to proceed. 6.  Recommend that she increase her MiraLAX to 2 doses in the morning and continue the at night stool softener for constipation.  She may just MiraLAX further if needed. 7.  Ongoing general medical care with PCP

## 2019-11-19 NOTE — Patient Instructions (Signed)
You have been scheduled for an endoscopy and colonoscopy. Please follow the written instructions given to you at your visit today. Please pick up your prep supplies at the pharmacy within the next 1-3 days. If you use inhalers (even only as needed), please bring them with you on the day of your procedure.   Increase your Miralax to 2 doses a day

## 2019-11-29 LAB — HM DIABETES EYE EXAM

## 2019-12-11 ENCOUNTER — Telehealth: Payer: Self-pay | Admitting: Internal Medicine

## 2019-12-11 MED ORDER — SUTAB 1479-225-188 MG PO TABS: 1.0000 | ORAL_TABLET | Freq: Once | ORAL | 0 refills | Status: AC

## 2019-12-11 NOTE — Telephone Encounter (Signed)
Sent sutab to pharmacy

## 2019-12-18 ENCOUNTER — Telehealth: Payer: Self-pay | Admitting: Family Medicine

## 2019-12-18 ENCOUNTER — Other Ambulatory Visit: Payer: Self-pay

## 2019-12-18 DIAGNOSIS — Z0000 Encounter for general adult medical examination without abnormal findings: Secondary | ICD-10-CM

## 2019-12-18 NOTE — Telephone Encounter (Signed)
Pt will be here 03/14/2020 please order fasting labs for physical scheduled for 03/20/2020

## 2020-01-14 ENCOUNTER — Encounter: Payer: 59 | Admitting: Internal Medicine

## 2020-01-31 ENCOUNTER — Other Ambulatory Visit: Payer: Self-pay | Admitting: Internal Medicine

## 2020-01-31 NOTE — Telephone Encounter (Signed)
Pt has not been seen in almost one full year. Would you like her to be scheduled or seen before refilling?

## 2020-02-06 ENCOUNTER — Other Ambulatory Visit: Payer: Self-pay | Admitting: Internal Medicine

## 2020-02-19 ENCOUNTER — Encounter: Payer: 59 | Admitting: Internal Medicine

## 2020-02-28 ENCOUNTER — Ambulatory Visit (INDEPENDENT_AMBULATORY_CARE_PROVIDER_SITE_OTHER): Payer: 59 | Admitting: Family Medicine

## 2020-02-28 ENCOUNTER — Other Ambulatory Visit: Payer: Self-pay

## 2020-02-28 ENCOUNTER — Encounter: Payer: Self-pay | Admitting: Family Medicine

## 2020-02-28 VITALS — BP 124/75 | HR 88 | Temp 98.2°F | Ht 60.0 in | Wt 179.0 lb

## 2020-02-28 DIAGNOSIS — R7303 Prediabetes: Secondary | ICD-10-CM | POA: Diagnosis not present

## 2020-02-28 DIAGNOSIS — E559 Vitamin D deficiency, unspecified: Secondary | ICD-10-CM | POA: Diagnosis not present

## 2020-02-28 DIAGNOSIS — Z23 Encounter for immunization: Secondary | ICD-10-CM

## 2020-02-28 DIAGNOSIS — E782 Mixed hyperlipidemia: Secondary | ICD-10-CM | POA: Diagnosis not present

## 2020-02-28 LAB — POCT CBC
Granulocyte percent: 57.3 %G (ref 37–80)
HCT, POC: 42.3 % — AB (ref 29–41)
Hemoglobin: 14.1 g/dL (ref 11–14.6)
Lymph, poc: 3.2 (ref 0.6–3.4)
MCH, POC: 30.6 pg (ref 27–31.2)
MCHC: 33.3 g/dL (ref 31.8–35.4)
MCV: 92 fL (ref 76–111)
MID (cbc): 0.4 (ref 0–0.9)
MPV: 8.1 fL (ref 0–99.8)
POC Granulocyte: 4.9 (ref 2–6.9)
POC LYMPH PERCENT: 37.9 %L (ref 10–50)
POC MID %: 4.8 %M (ref 0–12)
Platelet Count, POC: 339 10*3/uL (ref 142–424)
RBC: 4.59 M/uL (ref 4.04–5.48)
RDW, POC: 13 %
WBC: 8.5 10*3/uL (ref 4.6–10.2)

## 2020-02-28 LAB — LIPID PANEL
Chol/HDL Ratio: 4.5 ratio — ABNORMAL HIGH (ref 0.0–4.4)
Cholesterol, Total: 254 mg/dL — ABNORMAL HIGH (ref 100–199)
HDL: 57 mg/dL (ref >39)
LDL Chol Calc (NIH): 136 mg/dL — ABNORMAL HIGH (ref 0–99)
Triglycerides: 337 mg/dL — ABNORMAL HIGH (ref 0–149)
VLDL Cholesterol Cal: 61 mg/dL — ABNORMAL HIGH (ref 5–40)

## 2020-02-28 LAB — COMPREHENSIVE METABOLIC PANEL
ALT: 39 IU/L — ABNORMAL HIGH (ref 0–32)
AST: 25 IU/L (ref 0–40)
Albumin/Globulin Ratio: 1.6 (ref 1.2–2.2)
Albumin: 5.1 g/dL — ABNORMAL HIGH (ref 3.8–4.9)
Alkaline Phosphatase: 111 IU/L (ref 44–121)
BUN/Creatinine Ratio: 15 (ref 9–23)
BUN: 11 mg/dL (ref 6–24)
Bilirubin Total: 0.5 mg/dL (ref 0.0–1.2)
CO2: 23 mmol/L (ref 20–29)
Calcium: 10.2 mg/dL (ref 8.7–10.2)
Chloride: 101 mmol/L (ref 96–106)
Creatinine, Ser: 0.72 mg/dL (ref 0.57–1.00)
GFR calc Af Amer: 110 mL/min/{1.73_m2} (ref >59)
GFR calc non Af Amer: 95 mL/min/{1.73_m2} (ref >59)
Globulin, Total: 3.1 g/dL (ref 1.5–4.5)
Glucose: 97 mg/dL (ref 65–99)
Potassium: 5 mmol/L (ref 3.5–5.2)
Sodium: 138 mmol/L (ref 134–144)
Total Protein: 8.2 g/dL (ref 6.0–8.5)

## 2020-02-28 MED ORDER — ATORVASTATIN CALCIUM 20 MG PO TABS
ORAL_TABLET | ORAL | 3 refills | Status: DC
Start: 2020-02-28 — End: 2021-03-25

## 2020-02-28 MED ORDER — VITAMIN D 25 MCG (1000 UNIT) PO TABS: 3000.0000 [IU] | ORAL_TABLET | Freq: Every day | ORAL | Status: DC

## 2020-02-28 MED ORDER — METFORMIN HCL 500 MG PO TABS
500.0000 mg | ORAL_TABLET | Freq: Every day | ORAL | 1 refills | Status: DC
Start: 2020-02-28 — End: 2020-09-10

## 2020-02-28 NOTE — Progress Notes (Signed)
9/16/20218:58 AM  [name] [name] [birth year], 54 y.o., female [id number]  Chief Complaint  Patient presents with  . Follow-up    hlp, stopped taking lyrica due to effecting vision. Followed by pain management. has form per ins to be completed    HPI:   Patient is a 54 y.o. female with past medical history significant for fibromyalgia, cervical radiculopathy, SI dysfunction, lumbosacral radiculopathy,s/p multiple spine surgeries, pre-diabetes, fatty liver, colonic polyps, morphea who presents today forroutine followup  Last OV March 2021 - trial of lactulose and famotidine Saw GI, Dr Henrene Pastor, in June 2021 - EGD for GERD and cont epigastric pain, colonoscopy for surveillance, increase miralax to 2 doses AM and stool softner in PM Seeing new pain mgt, Dr Francesco Runner, going well Has been seeing retinal specialist due to changes, Dr Luisa Hart, concern re steroid exposure Has seen derm, bx of RUE shows morphea, labs done and referral to rheum, patient states she saw Dr Trudie Reed, and local involvement only, followup in 6 months  She is overall doing well She has no acute concerns today She is needing renewal of disability parking placard Famotidine is working ok She did not tolerate lactulose She cont to take metformin and atorvastatin She cont to take vitamin D3 3000 units a day  Lab Results  Component Value Date   HGBA1C 6.1 (H) 08/28/2019   HGBA1C 6.0 (A) 01/16/2019   HGBA1C 6.1 (H) 10/02/2018   Lab Results  Component Value Date   LDLCALC 92 08/28/2019   CREATININE 0.72 08/28/2019    Depression screen PHQ 2/9 08/28/2019 05/23/2018 04/04/2018  Decreased Interest 1 0 0  Down, Depressed, Hopeless 1 0 0  PHQ - 2 Score 2 0 0  Altered sleeping 2 - -  Tired, decreased energy 3 - -  Change in appetite 1 - -  Feeling bad or failure about yourself  0 - -  Trouble concentrating 1 - -  Moving slowly or fidgety/restless 0 - -  Suicidal thoughts 0 - -  PHQ-9 Score 9 - -    Difficult doing work/chores - - -    Fall Risk  02/28/2020 08/28/2019 02/27/2019 05/23/2018 05/02/2018  Falls in the past year? 0 0 0 0 0  Number falls in past yr: 0 - 0 - -  Injury with Fall? 0 - 0 - -  Follow up - Falls evaluation completed - - -     Allergies  Allergen Reactions  . Latex Swelling  . Other     LATEX, kiwi, papaya, fish  . Macrodantin [Nitrofurantoin] Rash and Other (See Comments)    Rash, pain behind eyes    Prior to Admission medications   Medication Sig Start Date End Date Taking? Authorizing Provider  atorvastatin (LIPITOR) 20 MG tablet TAKE 1 TABLET(20 MG) BY MOUTH DAILY 02/06/20  Yes Shamleffer, Melanie Crazier, MD  Calcium Carbonate-Vitamin D (CALTRATE 600+D PO) Take by mouth.   Yes [provider]  cholecalciferol (VITAMIN D) 1000 UNITS tablet Take 1,000 Units by mouth 2 (two) times daily.     Yes [provider]  famotidine (PEPCID) 20 MG tablet Take 1 tablet (20 mg total) by mouth 2 (two) times daily. 08/28/19  Yes Rutherford Guys, MD  metFORMIN (GLUCOPHAGE) 500 MG tablet Take 1 tablet (500 mg total) by mouth daily with breakfast. 01/31/20  Yes Shamleffer, Melanie Crazier, MD  Misc. Devices (BATH/SHOWER SEAT) MISC Use shower chair/seat while bathing 05/02/18  Yes Rutherford Guys, MD  oxyCODONE (OXY IR/ROXICODONE) 5  MG immediate release tablet Take 1 tablet (5 mg total) by mouth every 6 (six) hours as needed for severe pain. 02/08/18  Yes Rutherford Guys, MD    Past Medical History:  Diagnosis Date  . Acid reflux   . Adenomatous colon polyp    2012  . Anxiety   . Constipation, chronic   . Degenerative disc disease, cervical    s/p epidural injections  . Depression   . Fatty liver disease, nonalcoholic    per ultrasound  . Fibromyalgia   . Hypercholesterolemia   . NSVD (normal spontaneous vaginal delivery)    X2  . Prediabetes    a1c 6.1  . Seizures ([place])    with dental procedure  . Vitamin D deficiency     Past Surgical  History:  Procedure Laterality Date  . BACK SURGERY  2008  . BREAST SURGERY     REDUCTIVE MAMMOPLASTY  . herniated disc    . LAPAROSCOPIC CHOLECYSTECTOMY    . LUMBAR DISC SURGERY     L-4 AND L-5   . PELVIC LAPAROSCOPY     BTSP  . TONSILLECTOMY AND ADENOIDECTOMY    . VAGINAL HYSTERECTOMY     Post Repair/TVT Sling    Social History   Tobacco Use  . Smoking status: Never Smoker  . Smokeless tobacco: Never Used  Substance Use Topics  . Alcohol use: No    Family History  Problem Relation Age of Onset  . Hypertension Sister   . Cancer Sister        colon ca... stepsister  . Colon cancer Sister     Review of Systems  Constitutional: Negative for chills and fever.  Respiratory: Negative for cough and shortness of breath.   Cardiovascular: Negative for chest pain, palpitations and leg swelling.  Gastrointestinal: Negative for abdominal pain, nausea and vomiting.   Per hpi  OBJECTIVE:  Today's Vitals   02/28/20 0851  BP: 124/75  Pulse: 88  Temp: 98.2 F (36.8 C)  SpO2: 98%  Weight: 179 lb (81.2 kg)  Height: 5' (1.524 m)   Body mass index is 34.96 kg/m.   Physical Exam Vitals and nursing note reviewed.  Constitutional:      Appearance: She is well-developed.  HENT:     Head: Normocephalic and atraumatic.     Mouth/Throat:     Pharynx: No oropharyngeal exudate.  Eyes:     General: No scleral icterus.    Extraocular Movements: Extraocular movements intact.     Conjunctiva/sclera: Conjunctivae normal.     Pupils: Pupils are equal, round, and reactive to light.  Cardiovascular:     Rate and Rhythm: Normal rate and regular rhythm.     Heart sounds: Normal heart sounds. No murmur heard.  No friction rub. No gallop.   Pulmonary:     Effort: Pulmonary effort is normal.     Breath sounds: Normal breath sounds. No wheezing, rhonchi or rales.  Musculoskeletal:     Cervical back: Neck supple.  Skin:    General: Skin is warm and dry.  Neurological:     Mental  Status: She is alert and oriented to person, place, and time.     Results for orders placed or performed in visit on 02/28/20 (from the past 24 hour(s))  POCT CBC     Status: Abnormal   Collection Time: 02/28/20  9:07 AM  Result Value Ref Range   WBC 8.5 4.6 - 10.2 K/uL   Lymph, poc 3.2 0.6 - 3.4  POC LYMPH PERCENT 37.9 10 - 50 %L   MID (cbc) 0.4 0 - 0.9   POC MID % 4.8 0 - 12 %M   POC Granulocyte 4.9 2 - 6.9   Granulocyte percent 57.3 37 - 80 %G   RBC 4.59 4.04 - 5.48 M/uL   Hemoglobin 14.1 11 - 14.6 g/dL   HCT, POC 42.3 (A) 29 - 41 %   MCV 92.0 76 - 111 fL   MCH, POC 30.6 27 - 31.2 pg   MCHC 33.3 31.8 - 35.4 g/dL   RDW, POC 13.0 %   Platelet Count, POC 339 142 - 424 K/uL   MPV 8.1 0 - 99.8 fL    No results found.   ASSESSMENT and PLAN  1. Prediabetes Checking labs today, incl CBC as she is on metformin, cont with LFM - POCT CBC - no anemia or macrocytosis - Hemoglobin A1c  2. Vitamin D deficiency Checking labs today, medications will be adjusted as needed.  - VITAMIN D 25 Hydroxy (Vit-D Deficiency, Fractures)  3. Need for prophylactic vaccination and inoculation against influenza - Flu Vaccine QUAD 36+ mos IM  4. Mixed hyperlipidemia Checking labs today, medications will be adjusted as needed.  - Lipid panel - Comprehensive metabolic panel  Other orders - cholecalciferol (VITAMIN D3) 25 MCG (1000 UNIT) tablet; Take 3 tablets (3,000 Units total) by mouth daily. - atorvastatin (LIPITOR) 20 MG tablet; TAKE 1 TABLET(20 MG) BY MOUTH DAILY - metFORMIN (GLUCOPHAGE) 500 MG tablet; Take 1 tablet (500 mg total) by mouth daily with breakfast.  Will request retina specialist notes  Return in about 6 months (around 08/27/2020).    Rutherford Guys, MD Primary Care at [place], [place] [address] Ph.  [phone] Fax [phone]

## 2020-02-28 NOTE — Patient Instructions (Signed)
° ° ° °  If you have lab work done today you will be contacted with your lab results within the next 2 weeks.  If you have not heard from us then please contact us. The fastest way to get your results is to register for My Chart. ° ° °IF you received an x-ray today, you will receive an invoice from [facility]. Please contact [facility] at [phone] with questions or concerns regarding your invoice.  ° °IF you received labwork today, you will receive an invoice from [facility]. Please contact [facility] at [phone] with questions or concerns regarding your invoice.  ° °Our billing staff will not be able to assist you with questions regarding bills from these companies. ° °You will be contacted with the lab results as soon as they are available. The fastest way to get your results is to activate your My Chart account. Instructions are located on the last page of this paperwork. If you have not heard from us regarding the results in 2 weeks, please contact this office. °  ° ° ° °

## 2020-02-29 LAB — VITAMIN D 25 HYDROXY (VIT D DEFICIENCY, FRACTURES): Vit D, 25-Hydroxy: 25.7 ng/mL — ABNORMAL LOW (ref 30.0–100.0)

## 2020-02-29 LAB — HEMOGLOBIN A1C
Est. average glucose Bld gHb Est-mCnc: 134 mg/dL
Hgb A1c MFr Bld: 6.3 % — ABNORMAL HIGH (ref 4.8–5.6)

## 2020-03-13 ENCOUNTER — Institutional Professional Consult (permissible substitution): Payer: 59 | Admitting: Plastic Surgery

## 2020-03-14 ENCOUNTER — Ambulatory Visit: Payer: 59

## 2020-03-20 ENCOUNTER — Telehealth: Payer: Self-pay | Admitting: Emergency Medicine

## 2020-03-20 ENCOUNTER — Encounter: Payer: 59 | Admitting: Family Medicine

## 2020-03-20 NOTE — Telephone Encounter (Signed)
Pt called she had missed a phone call from our office / unable to find documantation

## 2020-05-27 ENCOUNTER — Encounter: Payer: Self-pay | Admitting: Emergency Medicine

## 2020-05-28 NOTE — Telephone Encounter (Signed)
She has been told that I am not on the [facility] health physicians network which is not accurate.  Please look into this.  Thanks.

## 2020-06-23 ENCOUNTER — Other Ambulatory Visit: Payer: 59

## 2020-06-23 DIAGNOSIS — Z20822 Contact with and (suspected) exposure to covid-19: Secondary | ICD-10-CM

## 2020-06-25 ENCOUNTER — Encounter: Payer: Self-pay | Admitting: Emergency Medicine

## 2020-06-25 ENCOUNTER — Telehealth (INDEPENDENT_AMBULATORY_CARE_PROVIDER_SITE_OTHER): Payer: 59 | Admitting: Emergency Medicine

## 2020-06-25 ENCOUNTER — Other Ambulatory Visit: Payer: Self-pay

## 2020-06-25 VITALS — Ht 60.0 in | Wt 177.0 lb

## 2020-06-25 DIAGNOSIS — R110 Nausea: Secondary | ICD-10-CM

## 2020-06-25 DIAGNOSIS — B349 Viral infection, unspecified: Secondary | ICD-10-CM

## 2020-06-25 DIAGNOSIS — G894 Chronic pain syndrome: Secondary | ICD-10-CM

## 2020-06-25 DIAGNOSIS — F410 Panic disorder [episodic paroxysmal anxiety] without agoraphobia: Secondary | ICD-10-CM

## 2020-06-25 MED ORDER — ONDANSETRON 4 MG PO TBDP: 4.0000 mg | ORAL_TABLET | Freq: Three times a day (TID) | ORAL | 1 refills | Status: DC | PRN

## 2020-06-25 NOTE — Patient Instructions (Signed)
° ° ° °  If you have lab work done today you will be contacted with your lab results within the next 2 weeks.  If you have not heard from us then please contact us. The fastest way to get your results is to register for My Chart. ° ° °IF you received an x-ray today, you will receive an invoice from [facility]. Please contact [facility] at [phone] with questions or concerns regarding your invoice.  ° °IF you received labwork today, you will receive an invoice from [facility]. Please contact [facility] at [phone] with questions or concerns regarding your invoice.  ° °Our billing staff will not be able to assist you with questions regarding bills from these companies. ° °You will be contacted with the lab results as soon as they are available. The fastest way to get your results is to activate your My Chart account. Instructions are located on the last page of this paperwork. If you have not heard from us regarding the results in 2 weeks, please contact this office. °  ° ° ° °

## 2020-06-25 NOTE — Progress Notes (Signed)
Telemedicine Encounter- SOAP NOTE Established Patient Patient: Home  Provider: Office     This telephone encounter was conducted with the patient's (or proxy's) verbal consent via audio telecommunications: yes/no: Yes Patient was instructed to have this encounter in a suitably private space; and to only have persons present to whom they give permission to participate. In addition, patient identity was confirmed by use of name plus two identifiers (DOB and address).  I discussed the limitations, risks, security and privacy concerns of performing an evaluation and management service by telephone and the availability of in person appointments. I also discussed with the patient that there may be a patient responsible charge related to this service. The patient expressed understanding and agreed to proceed.  I spent a total of TIME; 0 MIN TO 60 MIN: 20 minutes talking with the patient or their proxy.  Chief Complaint  Patient presents with  . Chills    Per patient it started on Monday and Covid test pending, feeling faint, ear pain  . Nausea    Without vomiting    Subjective   [name] is a 55 y.o. female established patient.  First visit with me.  Telephone visit today complaining of nausea. Got sick 2 days ago with headache, nausea, chills, sore throat, and ear discomfort.  Had near syncopal episode yesterday that lasted about 5 minutes.  No other associated symptoms.  Tested for COVID but results still pending.  Husband was recently sick with similar symptoms and diagnosed with influenza. No other complaints or medical concerns today.  HPI   Patient Active Problem List   Diagnosis Date Noted  . Fatty liver 01/16/2019  . Cushingoid facies 10/17/2018  . Acanthosis nigricans 10/17/2018  . Hypertriglyceridemia 10/17/2018  . Adenomatous colon polyp   . Degenerative disc disease, cervical   . Fatty liver disease, nonalcoholic   . Prediabetes   . Vitamin D  deficiency   . Lumbosacral radiculopathy 06/04/2012  . Pain, chronic due to trauma 06/04/2012  . Constipation 09/29/2011  . History of colonic polyps 09/29/2011  . Fibromyalgia   . Acid reflux   . Hypercholesterolemia   . DYSPEPSIA, CHRONIC 11/06/2008  . ALLERGIC RHINITIS 10/22/2008  . Hyperlipidemia 09/15/2007    Past Medical History:  Diagnosis Date  . Acid reflux   . Adenomatous colon polyp    2012  . Anxiety   . Constipation, chronic   . Degenerative disc disease, cervical    s/p epidural injections  . Depression   . Fatty liver disease, nonalcoholic    per ultrasound  . Fibromyalgia   . Hypercholesterolemia   . NSVD (normal spontaneous vaginal delivery)    X2  . Prediabetes    a1c 6.1  . Seizures ([place])    with dental procedure  . Vitamin D deficiency     Current Outpatient Medications  Medication Sig Dispense Refill  . atorvastatin (LIPITOR) 20 MG tablet TAKE 1 TABLET(20 MG) BY MOUTH DAILY 90 tablet 3  . Calcium Carbonate-Vitamin D (CALTRATE 600+D PO) Take by mouth.    . cholecalciferol (VITAMIN D3) 25 MCG (1000 UNIT) tablet Take 3 tablets (3,000 Units total) by mouth daily.    . famotidine (PEPCID) 20 MG tablet Take 1 tablet (20 mg total) by mouth 2 (two) times daily. 180 tablet 1  . metFORMIN (GLUCOPHAGE) 500 MG tablet Take 1 tablet (500 mg total) by mouth daily with breakfast. 90 tablet 1  . oxyCODONE (OXY IR/ROXICODONE) 5 MG immediate release tablet Take  1 tablet (5 mg total) by mouth every 6 (six) hours as needed for severe pain. 30 tablet 0  . Misc. Devices (BATH/SHOWER SEAT) MISC Use shower chair/seat while bathing 1 each 0  . ondansetron (ZOFRAN ODT) 4 MG disintegrating tablet Take 1 tablet (4 mg total) by mouth every 8 (eight) hours as needed for nausea or vomiting. 20 tablet 1   No current facility-administered medications for this visit.    Allergies  Allergen Reactions  . Latex Swelling  . Other     LATEX, kiwi, papaya, fish  . Macrodantin  [Nitrofurantoin] Rash and Other (See Comments)    Rash, pain behind eyes    Social History   Socioeconomic History  . Marital status: Married    Spouse name: Not on file  . Number of children: 2  . Years of education: Not on file  . Highest education level: Not on file  Occupational History    Employer: [employer]  Tobacco Use  . Smoking status: Never Smoker  . Smokeless tobacco: Never Used  Vaping Use  . Vaping Use: Never used  Substance and Sexual Activity  . Alcohol use: No  . Drug use: No  . Sexual activity: Not Currently    Partners: Male    Comment: [facility]  Other Topics Concern  . Not on file  Social History Narrative  . Not on file   Social Determinants of Health   Financial Resource Strain: Not on file  Food Insecurity: Not on file  Transportation Needs: Not on file  Physical Activity: Not on file  Stress: Not on file  Social Connections: Not on file  Intimate Partner Violence: Not on file    Review of Systems  Constitutional: Negative.  Negative for chills and fever.  HENT: Negative.  Negative for congestion and sore throat.   Respiratory: Negative.  Negative for cough and shortness of breath.   Cardiovascular: Negative.  Negative for chest pain and palpitations.  Gastrointestinal: Positive for nausea. Negative for abdominal pain, blood in stool, melena and vomiting.  Genitourinary: Negative.  Negative for dysuria and hematuria.  Musculoskeletal: Negative for back pain, myalgias and neck pain.  Skin: Negative.  Negative for rash.  Neurological: Negative.  Negative for dizziness and headaches.  Psychiatric/Behavioral: The patient is nervous/anxious.   All other systems reviewed and are negative.   Objective  Alert and oriented x3 in no apparent respiratory distress. Vitals as reported by the patient: Today's Vitals   06/25/20 1348  Weight: 177 lb (80.3 kg)  Height: 5' (1.524 m)    [name] was seen today for chills and nausea.  Diagnoses  and all orders for this visit:  Nausea without vomiting -     ondansetron (ZOFRAN ODT) 4 MG disintegrating tablet; Take 1 tablet (4 mg total) by mouth every 8 (eight) hours as needed for nausea or vomiting.  [name] was seen today for chills and nausea.  Diagnoses and all orders for this visit:  Nausea without vomiting -     ondansetron (ZOFRAN ODT) 4 MG disintegrating tablet; Take 1 tablet (4 mg total) by mouth every 8 (eight) hours as needed for nausea or vomiting.  Viral illness  Chronic pain syndrome  Panic attacks   Clinically stable.  No red flag signs or symptoms.  Meds as prescribed. COVID test still pending.  COVID advice given.  ED precautions given. Advised to contact the office if no better or worse during the next several days.   I discussed the  assessment and treatment plan with the patient. The patient was provided an opportunity to ask questions and all were answered. The patient agreed with the plan and demonstrated an understanding of the instructions.   The patient was advised to call back or seek an in-person evaluation if the symptoms worsen or if the condition fails to improve as anticipated.  I provided 20 minutes of non-face-to-face time during this encounter.  Horald Pollen, MD  [facility]

## 2020-06-26 LAB — NOVEL CORONAVIRUS, NAA: SARS-CoV-2, NAA: NOT DETECTED

## 2020-06-26 LAB — SARS-COV-2, NAA 2 DAY TAT

## 2020-06-30 ENCOUNTER — Encounter: Payer: Self-pay | Admitting: Emergency Medicine

## 2020-07-02 ENCOUNTER — Ambulatory Visit: Payer: 59 | Admitting: Internal Medicine

## 2020-07-04 ENCOUNTER — Ambulatory Visit: Payer: 59 | Admitting: Internal Medicine

## 2020-07-21 ENCOUNTER — Encounter: Payer: Self-pay | Admitting: Nurse Practitioner

## 2020-08-06 ENCOUNTER — Encounter: Payer: Self-pay | Admitting: Emergency Medicine

## 2020-08-06 ENCOUNTER — Ambulatory Visit (INDEPENDENT_AMBULATORY_CARE_PROVIDER_SITE_OTHER): Payer: 59 | Admitting: Emergency Medicine

## 2020-08-06 ENCOUNTER — Other Ambulatory Visit: Payer: Self-pay

## 2020-08-06 VITALS — BP 121/77 | HR 107 | Temp 97.9°F | Resp 16 | Ht 60.0 in | Wt 177.0 lb

## 2020-08-06 DIAGNOSIS — R7303 Prediabetes: Secondary | ICD-10-CM

## 2020-08-06 DIAGNOSIS — Z1211 Encounter for screening for malignant neoplasm of colon: Secondary | ICD-10-CM

## 2020-08-06 DIAGNOSIS — K2100 Gastro-esophageal reflux disease with esophagitis, without bleeding: Secondary | ICD-10-CM | POA: Diagnosis not present

## 2020-08-06 DIAGNOSIS — G894 Chronic pain syndrome: Secondary | ICD-10-CM

## 2020-08-06 DIAGNOSIS — E785 Hyperlipidemia, unspecified: Secondary | ICD-10-CM

## 2020-08-06 MED ORDER — PANTOPRAZOLE SODIUM 40 MG PO TBEC: 40.0000 mg | DELAYED_RELEASE_TABLET | Freq: Every day | ORAL | 3 refills | Status: DC

## 2020-08-06 NOTE — Progress Notes (Signed)
[name] [name] 55 y.o.   Chief Complaint  Patient presents with  . Gastroesophageal Reflux    Per patient for one month  . Ear Pain    Per patient both ears for one month with liquid from the ears in the morning sometimes    HISTORY OF PRESENT ILLNESS: This is a 55 y.o. female with history of GERD complaining of chronic heartburn with radiation up into her neck area for several weeks. Denies nausea, vomiting, hematemesis, melena. Has a history of chronic pain syndrome.  On chronic pain medication under the care of chronic pain specialist. Also having bilateral ear pain. Former patient of Dr. Pamella Pert, not well known to me. Has appointment to establish care with me on August 27, 2020.  HPI   Prior to Admission medications   Medication Sig Start Date End Date Taking? Authorizing Provider  atorvastatin (LIPITOR) 20 MG tablet TAKE 1 TABLET(20 MG) BY MOUTH DAILY 02/28/20  Yes Jacelyn Pi, Lilia Argue, MD  Calcium Carbonate-Vitamin D (CALTRATE 600+D PO) Take by mouth.   Yes [provider]  cholecalciferol (VITAMIN D3) 25 MCG (1000 UNIT) tablet Take 3 tablets (3,000 Units total) by mouth daily. 02/28/20  Yes Jacelyn Pi, Lilia Argue, MD  famotidine (PEPCID) 20 MG tablet Take 1 tablet (20 mg total) by mouth 2 (two) times daily. 08/28/19  Yes Jacelyn Pi, Lilia Argue, MD  metFORMIN (GLUCOPHAGE) 500 MG tablet Take 1 tablet (500 mg total) by mouth daily with breakfast. 02/28/20  Yes Jacelyn Pi, Lilia Argue, MD  ondansetron (ZOFRAN ODT) 4 MG disintegrating tablet Take 1 tablet (4 mg total) by mouth every 8 (eight) hours as needed for nausea or vomiting. 06/25/20  Yes Shameka Aggarwal, Ines Bloomer, MD  oxyCODONE (OXY IR/ROXICODONE) 5 MG immediate release tablet Take 1 tablet (5 mg total) by mouth every 6 (six) hours as needed for severe pain. 02/08/18  Yes Jacelyn Pi, Lilia Argue, MD  pantoprazole (PROTONIX) 40 MG tablet Take 1 tablet (40 mg total) by mouth daily. 08/06/20  Yes Bralyn Espino, Ines Bloomer, MD   tiZANidine (ZANAFLEX) 2 MG tablet Take by mouth 3 (three) times daily.   Yes [provider]  Misc. Devices (BATH/SHOWER SEAT) MISC Use shower chair/seat while bathing 05/02/18   Jacelyn Pi, Lilia Argue, MD    Allergies  Allergen Reactions  . Latex Swelling  . Other     LATEX, kiwi, papaya, fish  . Macrodantin [Nitrofurantoin] Rash and Other (See Comments)    Rash, pain behind eyes    Patient Active Problem List   Diagnosis Date Noted  . Fatty liver 01/16/2019  . Cushingoid facies 10/17/2018  . Acanthosis nigricans 10/17/2018  . Hypertriglyceridemia 10/17/2018  . Adenomatous colon polyp   . Degenerative disc disease, cervical   . Fatty liver disease, nonalcoholic   . Prediabetes   . Vitamin D deficiency   . Lumbosacral radiculopathy 06/04/2012  . Pain, chronic due to trauma 06/04/2012  . Constipation 09/29/2011  . History of colonic polyps 09/29/2011  . Fibromyalgia   . Acid reflux   . Hypercholesterolemia   . DYSPEPSIA, CHRONIC 11/06/2008  . ALLERGIC RHINITIS 10/22/2008  . Hyperlipidemia 09/15/2007    Past Medical History:  Diagnosis Date  . Acid reflux   . Adenomatous colon polyp    2012  . Anxiety   . Constipation, chronic   . Degenerative disc disease, cervical    s/p epidural injections  . Depression   . Fatty liver disease, nonalcoholic    per ultrasound  .  Fibromyalgia   . Hypercholesterolemia   . NSVD (normal spontaneous vaginal delivery)    X2  . Prediabetes    a1c 6.1  . Seizures ([place])    with dental procedure  . Vitamin D deficiency     Past Surgical History:  Procedure Laterality Date  . BACK SURGERY  2008  . BREAST SURGERY     REDUCTIVE MAMMOPLASTY  . herniated disc    . LAPAROSCOPIC CHOLECYSTECTOMY    . LUMBAR DISC SURGERY     L-4 AND L-5   . PELVIC LAPAROSCOPY     BTSP  . TONSILLECTOMY AND ADENOIDECTOMY    . VAGINAL HYSTERECTOMY     Post Repair/TVT Sling    Social History   Socioeconomic History  . Marital status:  Married    Spouse name: Not on file  . Number of children: 2  . Years of education: Not on file  . Highest education level: Not on file  Occupational History    Employer: [employer]  Tobacco Use  . Smoking status: Never Smoker  . Smokeless tobacco: Never Used  Vaping Use  . Vaping Use: Never used  Substance and Sexual Activity  . Alcohol use: No  . Drug use: No  . Sexual activity: Not Currently    Partners: Male    Comment: HYST  Other Topics Concern  . Not on file  Social History Narrative  . Not on file   Social Determinants of Health   Financial Resource Strain: Not on file  Food Insecurity: Not on file  Transportation Needs: Not on file  Physical Activity: Not on file  Stress: Not on file  Social Connections: Not on file  Intimate Partner Violence: Not on file    Family History  Problem Relation Age of Onset  . Hypertension Sister   . Cancer Sister        colon ca... stepsister  . Colon cancer Sister      Review of Systems  Constitutional: Negative.  Negative for chills and fever.  HENT: Positive for ear pain. Negative for congestion and sore throat.   Respiratory: Negative.  Negative for cough and shortness of breath.   Cardiovascular: Negative.  Negative for chest pain and palpitations.  Gastrointestinal: Positive for heartburn. Negative for abdominal pain, blood in stool, diarrhea, melena, nausea and vomiting.  Genitourinary: Negative.  Negative for dysuria and hematuria.  Musculoskeletal: Negative.  Negative for back pain, myalgias and neck pain.  Skin: Negative.  Negative for rash.  Neurological: Negative for dizziness and headaches.  All other systems reviewed and are negative.    Today's Vitals   08/06/20 1032  BP: 121/77  Pulse: (!) 107  Resp: 16  Temp: 97.9 F (36.6 C)  TempSrc: Temporal  SpO2: 96%  Weight: 177 lb (80.3 kg)  Height: 5' (1.524 m)   Body mass index is 34.57 kg/m.   Physical Exam Vitals reviewed.   Constitutional:      Appearance: She is obese.  HENT:     Head: Normocephalic.     Right Ear: Tympanic membrane, ear canal and external ear normal.     Left Ear: Tympanic membrane, ear canal and external ear normal.     Mouth/Throat:     Mouth: Mucous membranes are moist.     Pharynx: Oropharynx is clear.  Eyes:     Extraocular Movements: Extraocular movements intact.     Conjunctiva/sclera: Conjunctivae normal.     Pupils: Pupils are equal, round, and reactive to  light.  Neck:     Comments: Short neck.  No induration. Cardiovascular:     Rate and Rhythm: Normal rate and regular rhythm.     Pulses: Normal pulses.     Heart sounds: Normal heart sounds.  Pulmonary:     Effort: Pulmonary effort is normal.     Breath sounds: Normal breath sounds.  Abdominal:     General: Bowel sounds are normal. There is no distension.     Palpations: Abdomen is soft.     Tenderness: There is no abdominal tenderness.  Musculoskeletal:        General: Normal range of motion.     Cervical back: Normal range of motion and neck supple.  Skin:    General: Skin is warm and dry.     Capillary Refill: Capillary refill takes less than 2 seconds.  Neurological:     General: No focal deficit present.     Mental Status: She is alert and oriented to person, place, and time.  Psychiatric:        Mood and Affect: Mood normal.        Behavior: Behavior normal.    A total of 30 minutes was spent with the patient, greater than 50% of which was in counseling/coordination of care regarding establishing care with me, review of multiple chronic medical problems, review of all medications, diagnosis of GERD and treatment options including new medication pantoprazole, education on nutrition, review of most recent office visit notes from Dr. Pamella Pert, need for GI evaluation and possible upper endoscopy, prognosis, documentation, need for follow-up.   ASSESSMENT & PLAN: Nakya was seen today for gastroesophageal  reflux and ear pain.  Diagnoses and all orders for this visit:  Gastroesophageal reflux disease with esophagitis without hemorrhage -     CMP14+EGFR -     Lipid panel -     Hemoglobin A1c -     CBC with Differential/Platelet -     Ambulatory referral to Gastroenterology -     pantoprazole (PROTONIX) 40 MG tablet; Take 1 tablet (40 mg total) by mouth daily.  Screening for colon cancer -     Cancel: Ambulatory referral to Gastroenterology  Chronic pain syndrome  Prediabetes  Hyperlipidemia, unspecified hyperlipidemia type    Patient Instructions       If you have lab work done today you will be contacted with your lab results within the next 2 weeks.  If you have not heard from [place] then please contact us. The fastest way to get your results is to register for My Chart.   IF you received an x-ray today, you will receive an invoice from [facility]. Please contact [facility] at [phone] with questions or concerns regarding your invoice.   IF you received labwork today, you will receive an invoice from [place] [facility]. Please contact [facility] at [phone] with questions or concerns regarding your invoice.   Our billing staff will not be able to assist you with questions regarding bills from these companies.  You will be contacted with the lab results as soon as they are available. The fastest way to get your results is to activate your My Chart account. Instructions are located on the last page of this paperwork. If you have not heard from [place] regarding the results in 2 weeks, please contact this office.     Enfermedad de reflujo gastroesofgico en los adultos Gastroesophageal Reflux Disease, Adult  El reflujo gastroesofgico (RGE) ocurre cuando el cido del estmago sube por el  tubo que conecta la boca con el estmago (esfago). Normalmente, la comida baja por el esfago y se mantiene en el estmago, donde se la digiere. Cuando una persona tiene RGE, los  alimentos y el cido estomacal suelen volver al esfago. Usted puede tener una enfermedad llamada enfermedad de reflujo gastroesofgico (ERGE) si el reflujo:  Sucede a menudo.  Causa sntomas frecuentes o muy intensos.  Causa problemas tales como dao en el esfago. Cuando esto ocurre, el esfago duele y se hincha. Con el tiempo, la ERGE puede ocasionar pequeos agujeros (lceras) en el revestimiento del esfago. Cules son las causas? Esta afeccin se debe a un problema en el msculo que se encuentra entre el esfago y [place]. Cuando este msculo est dbil o no es normal, no se cierra correctamente para impedir que los alimentos y el cido regresen del Paramedic. El msculo puede debilitarse debido a lo siguiente:  El consumo de [place].  [place].  Tener cierto tipo de hernia (hernia de hiato).  Consumo de alcohol.  Ciertos alimentos y bebidas, como caf, chocolate, cebollas y [place]. Qu incrementa el riesgo?  Tener sobrepeso.  Tener una enfermedad que afecta el tejido conjuntivo.  Tomar antiinflamatorios no esteroideos (AINE), como el ibuprofeno. Cules son los signos o sntomas?  Acidez estomacal.  Dificultad o dolor al tragar.  Sensacin de Best boy un bulto en la garganta.  Sabor amargo en la boca.  Mal aliento.  Tener una gran cantidad de saliva.  Estmago inflamado o con Tree surgeon.  Eructos.  Dolor en el pecho. El dolor de pecho puede deberse a distintas afecciones. Asegrese de Teacher, adult education a su mdico si tiene Tourist information centre manager.  Dificultad para respirar o sibilancias.  Ardelia Mems tos a largo plazo o tos nocturna.  Desgaste de la superficie de los dientes (esmalte dental).  Prdida de peso. Cmo se trata?  Realizar cambios en la dieta.  Tomar medicamentos.  Someterse a [place]. El tratamiento depender de la gravedad de los sntomas. Siga estas instrucciones en su casa: Comida y bebida  Siga una dieta como se lo haya indicado el mdico. Es  posible que deba evitar alimentos y bebidas, por ejemplo: ? Caf y t negro, con o sin cafena. ? Bebidas que contengan alcohol. ? Bebidas energticas y deportivas. ? Bebidas gaseosas (carbonatadas) y refrescos. ? Chocolate y cacao. ? Menta y [place]. ? Ajo y cebolla. ? Rbano picante. ? Alimentos cidos y condimentados. Estos incluyen todos los tipos de pimientos, [place] en polvo, curry en polvo, vinagre, salsas picantes y [employer]. ? Ctricos y sus jugos, por ejemplo, naranjas, limones y limas. ? Alimentos que [employer]. Estos incluyen salsa roja, [place], salsa picante y pizza con salsa de [place]. ? Alimentos fritos y Radio broadcast assistant. Estos incluyen donas, papas fritas, papitas fritas de bolsa y aderezos con alto contenido de [place]. ? Carnes con alto contenido de [place]. Estas incluye los perros calientes, chuletas o costillas, embutidos, jamn y tocino. ? Productos lcteos ricos en grasas, como leche [place], [place] y [place] crema.  Consuma pequeas cantidades de comida con ms frecuencia. Evite consumir porciones abundantes.  Evite beber grandes cantidades de lquidos con las comidas.  Evite comer 2 o 3horas antes de acostarse.  Evite recostarse inmediatamente despus de comer.  No haga ejercicios enseguida despus de comer.   Estilo de vida  No fume ni consuma ningn producto que contenga nicotina o tabaco. Si necesita ayuda para dejar de consumir estos productos, consulte al mdico.  Intente reducir [employer]  de estrs. Si necesita ayuda para hacer esto, consulte al mdico.  Si tiene sobrepeso, baje una cantidad de peso saludable para usted. Consulte a su mdico para bajar de peso de [employer].   Instrucciones generales  Est atento a cualquier cambio en los sntomas.  Tome los medicamentos de venta libre y los recetados solamente como se lo haya indicado el mdico.  No tome aspirina, ibuprofeno ni otros antiinflamatorios no esteroideos (AINE) a menos que el  mdico lo autorice.  Use ropa holgada. No use nada apretado alrededor de la cintura.  Levante (eleve) la cabecera de la cama aproximadamente 6pulgadas (15cm). Para hacerlo, es posible que tenga que utilizar una cua.  Evite inclinarse si al hacerlo empeoran los sntomas.  Cumpla con todas las visitas de seguimiento. Comunquese con un mdico si:  Aparecen nuevos sntomas.  Adelgaza y no sabe por qu.  Tiene problemas para tragar o le duele cuando traga.  Tiene sibilancias o tos persistente.  Tiene la voz ronca.  Los sntomas no mejoran con Dispensing optician. Solicite ayuda de inmediato si:  Tree surgeon repentino [employer], el cuello, la [place] [address], los dientes o la espalda.  De repente se siente transpirado, mareado o aturdido.  Siente falta de aire o Tourist information centre manager.  Vomita y el vmito es de color verde, amarillo o negro, o tiene un aspecto similar a la sangre o a los posos de caf.  Se desmaya.  Las heces (deposiciones) son rojas, sanguinolentas o negras.  No puede tragar, beber o comer. Estos sntomas pueden representar un problema grave que constituye Engineer, maintenance (IT). No espere a ver si los sntomas desaparecen. Solicite atencin mdica de inmediato. Comunquese con el servicio de emergencias de su localidad (911 en los [place]). No conduzca por sus propios medios [employer]. Resumen  Si una persona tiene enfermedad de reflujo gastroesofgico (ERGE), los alimentos y el cido estomacal suben al esfago y causan sntomas o problemas tales como dao en el esfago.  El tratamiento depender de la gravedad de los sntomas.  Siga una dieta como se lo haya indicado el mdico.  Use todos los medicamentos solamente como se lo haya indicado el mdico. Esta informacin no tiene Marine scientist el consejo del mdico. Asegrese de hacerle al mdico cualquier pregunta que tenga. Document Revised: 01/11/2020 Document Reviewed: 01/11/2020 Elsevier Patient  Education  2021 Elsevier Inc.      Agustina Caroli, MD [facility]

## 2020-08-06 NOTE — Patient Instructions (Addendum)
If you have lab work done today you will be contacted with your lab results within the next 2 weeks.  If you have not heard from [place] then please contact us. The fastest way to get your results is to register for My Chart.   IF you received an x-ray today, you will receive an invoice from [facility]. Please contact [facility] at [phone] with questions or concerns regarding your invoice.   IF you received labwork today, you will receive an invoice from [place]. Please contact [facility] at [phone] with questions or concerns regarding your invoice.   Our billing staff will not be able to assist you with questions regarding bills from these companies.  You will be contacted with the lab results as soon as they are available. The fastest way to get your results is to activate your My Chart account. Instructions are located on the last page of this paperwork. If you have not heard from [place] regarding the results in 2 weeks, please contact this office.     Enfermedad de reflujo gastroesofgico en los adultos Gastroesophageal Reflux Disease, Adult  El reflujo gastroesofgico (RGE) ocurre cuando el cido del estmago sube por el tubo que conecta la boca con el estmago (esfago). Normalmente, la comida baja por el esfago y se mantiene en el estmago, donde se la digiere. Cuando una persona tiene RGE, los alimentos y el cido estomacal suelen volver al esfago. Usted puede tener una enfermedad llamada enfermedad de reflujo gastroesofgico (ERGE) si el reflujo:  Sucede a menudo.  Causa sntomas frecuentes o muy intensos.  Causa problemas tales como dao en el esfago. Cuando esto ocurre, el esfago duele y se hincha. Con el tiempo, la ERGE puede ocasionar pequeos agujeros (lceras) en el revestimiento del esfago. Cules son las causas? Esta afeccin se debe a un problema en el msculo que se encuentra entre el esfago y [place]. Cuando este msculo est dbil  o no es normal, no se cierra correctamente para impedir que los alimentos y el cido regresen del Paramedic. El msculo puede debilitarse debido a lo siguiente:  El consumo de [place].  [place].  Tener cierto tipo de hernia (hernia de hiato).  Consumo de alcohol.  Ciertos alimentos y bebidas, como caf, chocolate, cebollas y [place]. Qu incrementa el riesgo?  Tener sobrepeso.  Tener una enfermedad que afecta el tejido conjuntivo.  Tomar antiinflamatorios no esteroideos (AINE), como el ibuprofeno. Cules son los signos o sntomas?  Acidez estomacal.  Dificultad o dolor al tragar.  Sensacin de Best boy un bulto en la garganta.  Sabor amargo en la boca.  Mal aliento.  Tener una gran cantidad de saliva.  Estmago inflamado o con Tree surgeon.  Eructos.  Dolor en el pecho. El dolor de pecho puede deberse a distintas afecciones. Asegrese de Teacher, adult education a su mdico si tiene Tourist information centre manager.  Dificultad para respirar o sibilancias.  Ardelia Mems tos a largo plazo o tos nocturna.  Desgaste de la superficie de los dientes (esmalte dental).  Prdida de peso. Cmo se trata?  Realizar cambios en la dieta.  Tomar medicamentos.  Someterse a [place]. El tratamiento depender de la gravedad de los sntomas. Siga estas instrucciones en su casa: Comida y bebida  Siga una dieta como se lo haya indicado el mdico. Es posible que deba evitar alimentos y bebidas, por ejemplo: ? Caf y t negro, con o sin cafena. ? Bebidas que contengan alcohol. ? Bebidas energticas y deportivas. ? Bebidas gaseosas (carbonatadas)  y refrescos. ? Chocolate y cacao. ? Menta y [place]. ? Ajo y cebolla. ? Rbano picante. ? Alimentos cidos y condimentados. Estos incluyen todos los tipos de pimientos, [place] en polvo, curry en polvo, vinagre, salsas picantes y [employer]. ? Ctricos y sus jugos, por ejemplo, naranjas, limones y limas. ? Alimentos que [employer]. Estos incluyen salsa  roja, [place], salsa picante y pizza con salsa de [place]. ? Alimentos fritos y Radio broadcast assistant. Estos incluyen donas, papas fritas, papitas fritas de bolsa y aderezos con alto contenido de [place]. ? Carnes con alto contenido de [place]. Estas incluye los perros calientes, chuletas o costillas, embutidos, jamn y tocino. ? Productos lcteos ricos en grasas, como leche [place], [place] y [place] crema.  Consuma pequeas cantidades de comida con ms frecuencia. Evite consumir porciones abundantes.  Evite beber grandes cantidades de lquidos con las comidas.  Evite comer 2 o 3horas antes de acostarse.  Evite recostarse inmediatamente despus de comer.  No haga ejercicios enseguida despus de comer.   Estilo de vida  No fume ni consuma ningn producto que contenga nicotina o tabaco. Si necesita ayuda para dejar de consumir estos productos, consulte al mdico.  Intente reducir el nivel de estrs. Si necesita ayuda para hacer esto, consulte al mdico.  Si tiene sobrepeso, baje una cantidad de peso saludable para usted. Consulte a su mdico para bajar de peso de [employer].   Instrucciones generales  Est atento a cualquier cambio en los sntomas.  Tome los medicamentos de venta libre y los recetados solamente como se lo haya indicado el mdico.  No tome aspirina, ibuprofeno ni otros antiinflamatorios no esteroideos (AINE) a menos que el mdico lo autorice.  Use ropa holgada. No use nada apretado alrededor de la cintura.  Levante (eleve) la cabecera de la cama aproximadamente 6pulgadas (15cm). Para hacerlo, es posible que tenga que utilizar una cua.  Evite inclinarse si al hacerlo empeoran los sntomas.  Cumpla con todas las visitas de seguimiento. Comunquese con un mdico si:  Aparecen nuevos sntomas.  Adelgaza y no sabe por qu.  Tiene problemas para tragar o le duele cuando traga.  Tiene sibilancias o tos persistente.  Tiene la voz ronca.  Los sntomas no mejoran con Armed forces training and education officer. Solicite ayuda de inmediato si:  Tree surgeon repentino [employer], el cuello, la [place], los dientes o la espalda.  De repente se siente transpirado, mareado o aturdido.  Siente falta de aire o Tourist information centre manager.  Vomita y el vmito es de color verde, [place]ar[place] o negro, o tiene un aspecto similar a la sangre o a los posos de caf.  Se desmaya.  Las heces (deposiciones) son rojas, sanguinolentas o negras.  No puede tragar, beber o comer. Estos sntomas pueden representar un problema grave que constituye Engineer, maintenance (IT). No espere a ver si los sntomas desaparecen. Solicite atencin mdica de inmediato. Comunquese con el servicio de emergencias de su localidad (911 en los [place]). No conduzca por sus propios medios [employer]. Resumen  Si una persona tiene enfermedad de reflujo gastroesofgico (ERGE), los alimentos y el cido estomacal suben al esfago y causan sntomas o problemas tales como dao en el esfago.  El tratamiento depender de la gravedad de los sntomas.  Siga una dieta como se lo haya indicado el mdico.  Use todos los medicamentos solamente como se lo haya indicado el mdico. Esta informacin no tiene Marine scientist el consejo del mdico. Asegrese de hacerle al mdico cualquier  pregunta que tenga. Document Revised: 01/11/2020 Document Reviewed: 01/11/2020 Elsevier Patient Education  [place].

## 2020-08-07 LAB — CMP14+EGFR
ALT: 47 IU/L — ABNORMAL HIGH (ref 0–32)
AST: 32 IU/L (ref 0–40)
Albumin/Globulin Ratio: 1.6 (ref 1.2–2.2)
Albumin: 5.2 g/dL — ABNORMAL HIGH (ref 3.8–4.9)
Alkaline Phosphatase: 105 IU/L (ref 44–121)
BUN/Creatinine Ratio: 17 (ref 9–23)
BUN: 10 mg/dL (ref 6–24)
Bilirubin Total: 0.4 mg/dL (ref 0.0–1.2)
CO2: 21 mmol/L (ref 20–29)
Calcium: 9.9 mg/dL (ref 8.7–10.2)
Chloride: 99 mmol/L (ref 96–106)
Creatinine, Ser: 0.58 mg/dL (ref 0.57–1.00)
GFR calc Af Amer: 120 mL/min/{1.73_m2} (ref >59)
GFR calc non Af Amer: 104 mL/min/{1.73_m2} (ref >59)
Globulin, Total: 3.2 g/dL (ref 1.5–4.5)
Glucose: 86 mg/dL (ref 65–99)
Potassium: 4.7 mmol/L (ref 3.5–5.2)
Sodium: 139 mmol/L (ref 134–144)
Total Protein: 8.4 g/dL (ref 6.0–8.5)

## 2020-08-07 LAB — CBC WITH DIFFERENTIAL/PLATELET
Basophils Absolute: 0 10*3/uL (ref 0.0–0.2)
Basos: 0 %
EOS (ABSOLUTE): 0.2 10*3/uL (ref 0.0–0.4)
Eos: 2 %
Hematocrit: 41.6 % (ref 34.0–46.6)
Hemoglobin: 14 g/dL (ref 11.1–15.9)
Immature Grans (Abs): 0 10*3/uL (ref 0.0–0.1)
Immature Granulocytes: 0 %
Lymphocytes Absolute: 3.8 10*3/uL — ABNORMAL HIGH (ref 0.7–3.1)
Lymphs: 36 %
MCH: 30.6 pg (ref 26.6–33.0)
MCHC: 33.7 g/dL (ref 31.5–35.7)
MCV: 91 fL (ref 79–97)
Monocytes Absolute: 0.9 10*3/uL (ref 0.1–0.9)
Monocytes: 9 %
Neutrophils Absolute: 5.5 10*3/uL (ref 1.4–7.0)
Neutrophils: 53 %
Platelets: 371 10*3/uL (ref 150–450)
RBC: 4.58 x10E6/uL (ref 3.77–5.28)
RDW: 12.4 % (ref 11.7–15.4)
WBC: 10.5 10*3/uL (ref 3.4–10.8)

## 2020-08-07 LAB — LIPID PANEL
Chol/HDL Ratio: 3.4 ratio (ref 0.0–4.4)
Cholesterol, Total: 199 mg/dL (ref 100–199)
HDL: 59 mg/dL (ref >39)
LDL Chol Calc (NIH): 101 mg/dL — ABNORMAL HIGH (ref 0–99)
Triglycerides: 228 mg/dL — ABNORMAL HIGH (ref 0–149)
VLDL Cholesterol Cal: 39 mg/dL (ref 5–40)

## 2020-08-07 LAB — HEMOGLOBIN A1C
Est. average glucose Bld gHb Est-mCnc: 131 mg/dL
Hgb A1c MFr Bld: 6.2 % — ABNORMAL HIGH (ref 4.8–5.6)

## 2020-08-21 IMAGING — MR MR LUMBAR SPINE W/O CM
4 of 5 series · 25 of 48 positions shown · non-contrast
Comparison: MRI lumbar spine 04/12/2013.

CLINICAL DATA: Worsening low back and bilateral hip pain over the
past 2 years. Left leg pain, numbness and tingling. History of prior
lumbar surgery.

EXAM:
MRI LUMBAR SPINE WITHOUT CONTRAST
TECHNIQUE: Multiplanar, multisequence MR imaging of the lumbar spine was
performed. No intravenous contrast was administered.

[Series 3: T1 · sagittal · 4.0mm · 0.81mm/px · 6 of 14 slices shown (1 of 2)]
[im 1/14]
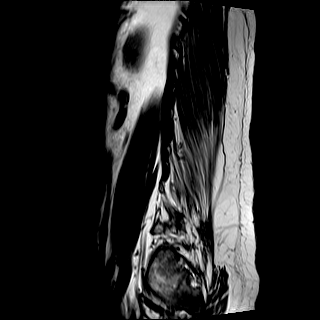
[im 3/14]
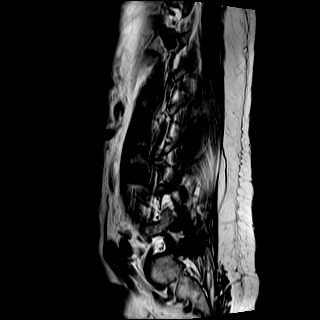
[im 6/14]
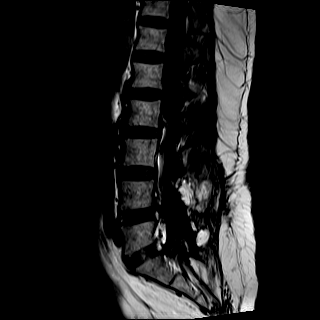
[im 8/14]
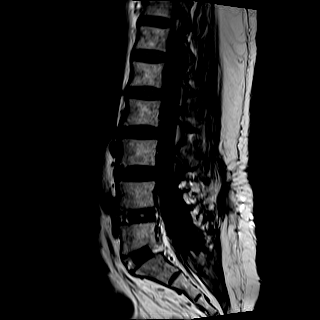
[im 11/14]
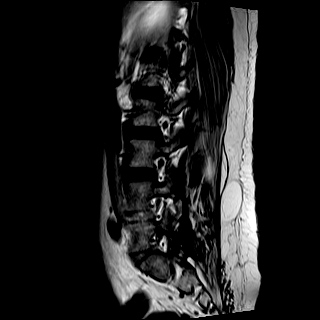
[im 14/14]
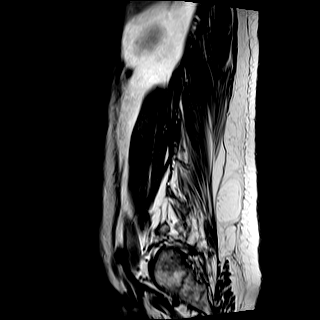

[Series 4: T2 · sagittal · 4.0mm · 0.81mm/px · 5 of 14 slices shown (1 of 2)]
[im 1/14]
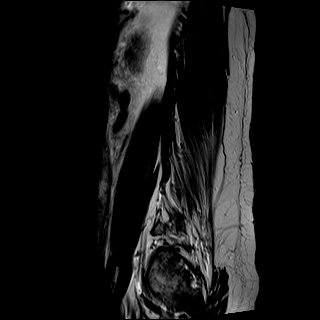
[im 4/14]
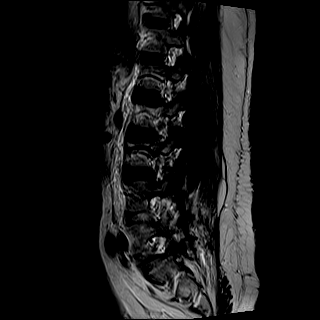
[im 7/14]
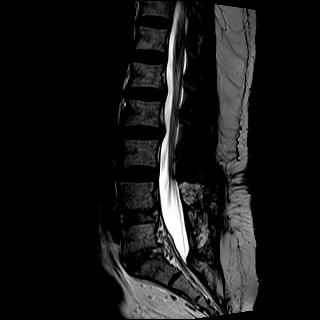
[im 10/14]
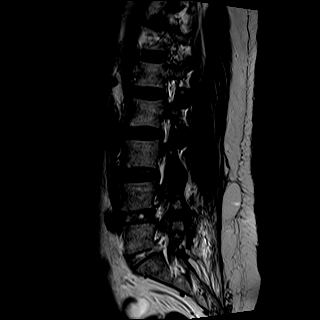
[im 14/14]
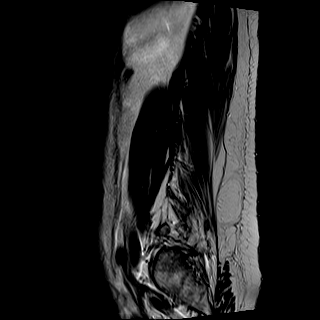

[Series 6: T2 · axial · 4.0mm · 0.39mm/px · z∈[-455,-261]mm · 10 of 42 slices shown (2 of 2)]
[im 3/42]
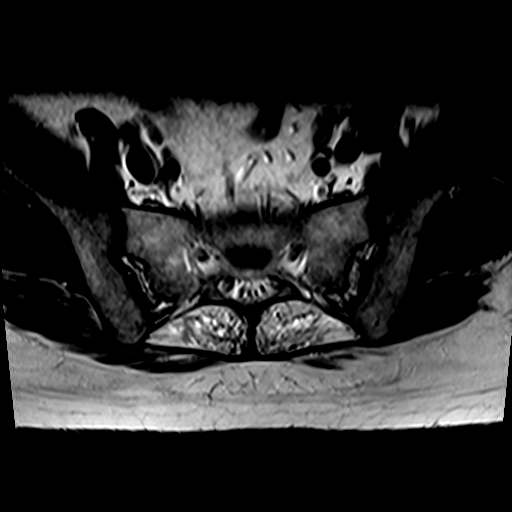
[im 6/42]
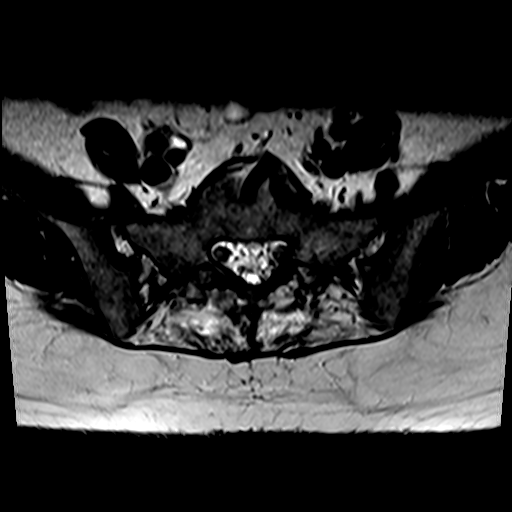
[im 9/42]
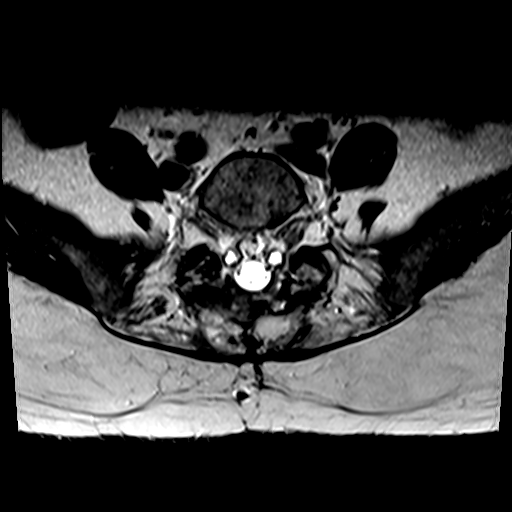
[im 14/42]
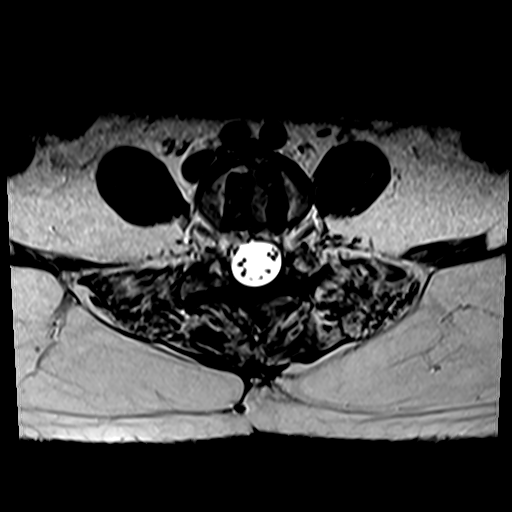
[im 20/42]
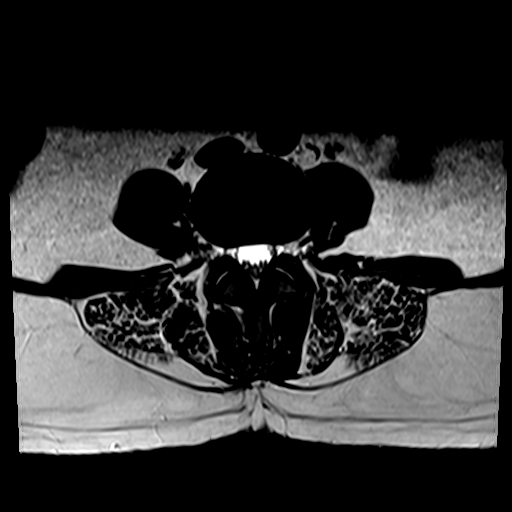
[im 22/42]
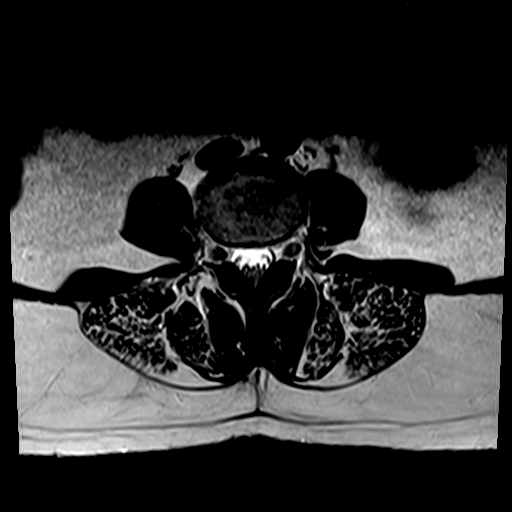
[im 25/42]
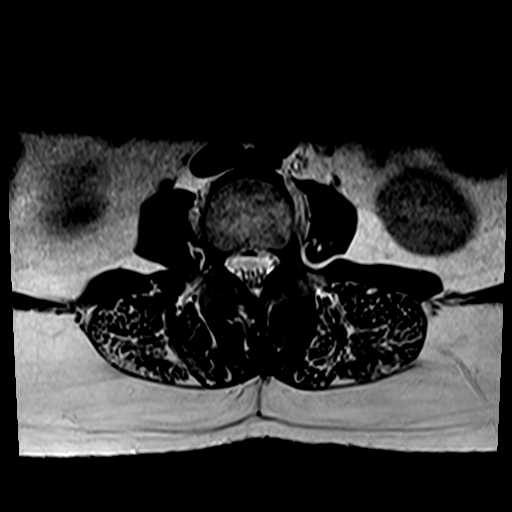
[im 31/42]
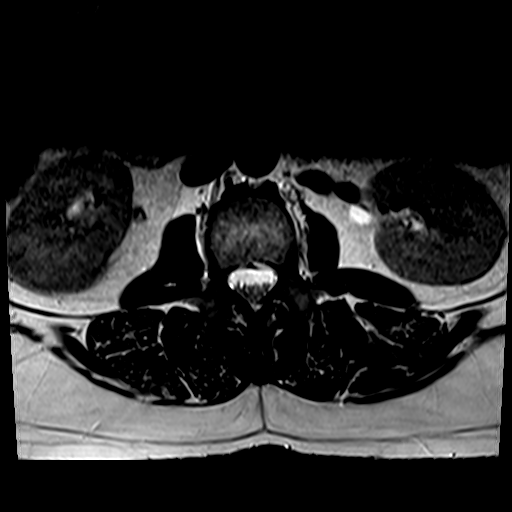
[im 36/42]
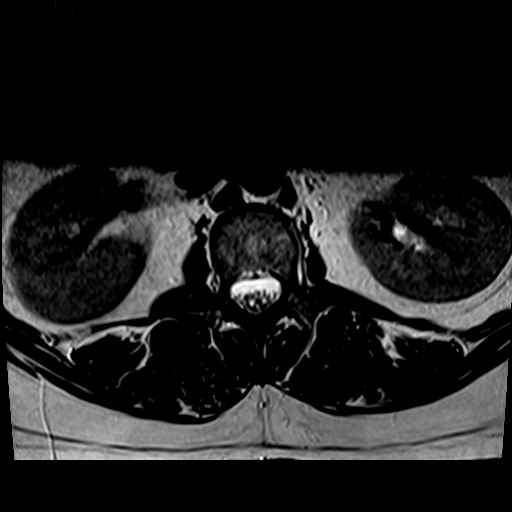
[im 42/42]
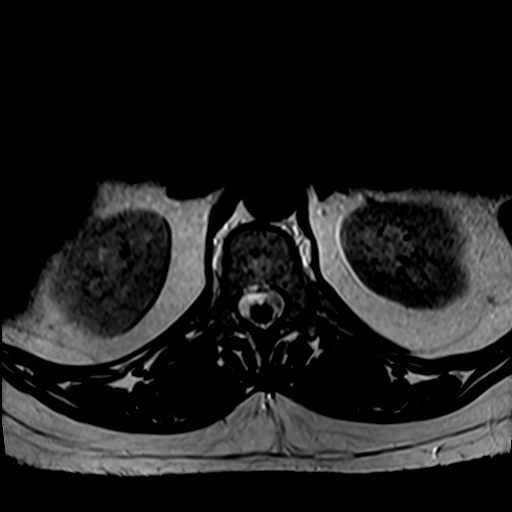

[Series 7: T1 · axial · 4.0mm · 0.39mm/px · z∈[-455,-291]mm · 4 of 42 slices shown (2 of 2)]
[im 3/42]
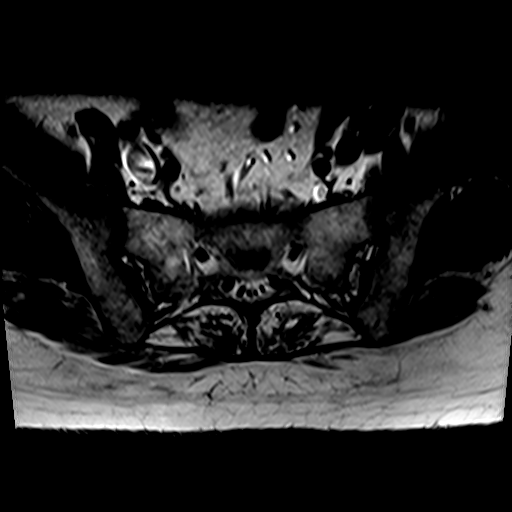
[im 6/42]
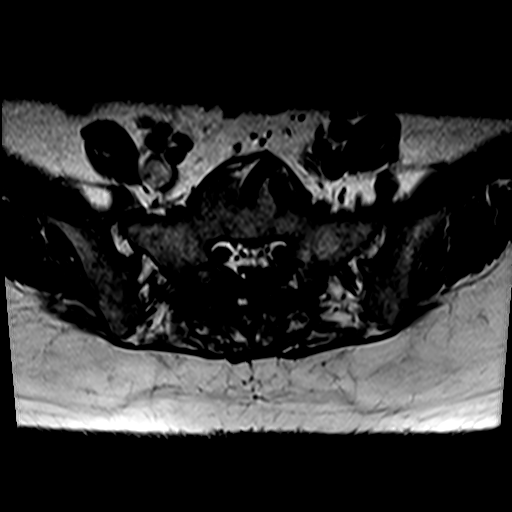
[im 22/42]
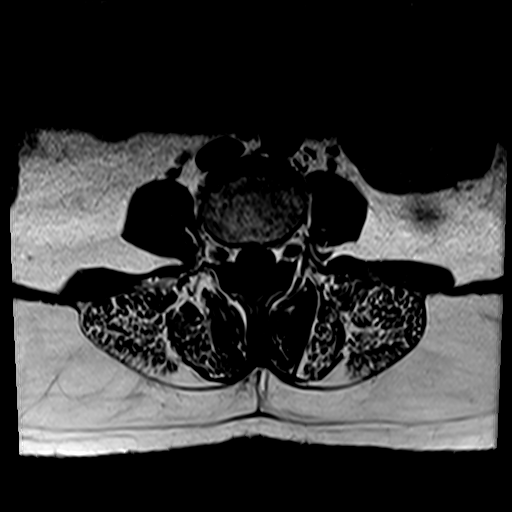
[im 36/42]
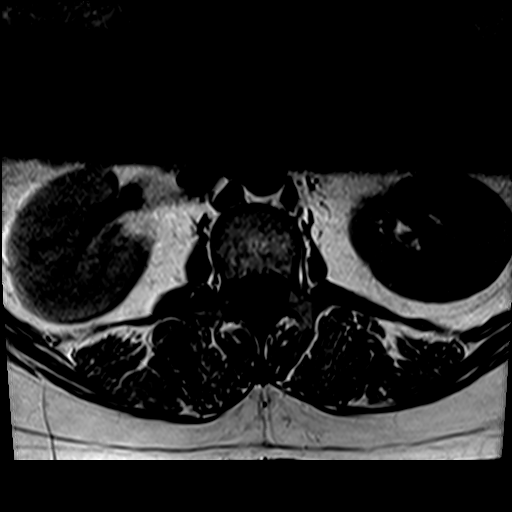

[25 of 48 positions shown; findings below may reference images not displayed]

FINDINGS: Segmentation:  Standard.

Alignment:  Maintained with straightening of lordosis noted.

Vertebrae: No fracture or worrisome lesion. The patient is status
post L4-S1 fusion.

Conus medullaris and cauda equina: Conus extends to the L1 level.
Conus and cauda equina appear normal.

Paraspinal and other soft tissues: Negative.

Disc levels:

T11-12 is imaged in the sagittal plane only and negative.

T12-L1: Negative.

L1-2: Very shallow broad-based right paracentral protrusion is new
since the prior examination. The central canal and foramina are
widely patent.

L2-3: A new left paracentral disc protrusion is identified with a
superimposed disc extrusion extending into the left foramen. Disc
encroaches on the descending left L3 and exiting left L2 roots. The
right foramen is open.

L3-4: Negative.

L4-5: Status post laminectomy and fusion. The central canal and
foramina are widely patent.

L5-S1: Status post laminectomy and fusion. The central canal and
foramina are widely patent.
IMPRESSION: New left subarticular disc protrusion with a superimposed extruded
disc fragment extending into the left foramen result in encroachment
on both the exiting left L2 and descending left L3 roots.

New very shallow right paracentral protrusion at L1-2 without
stenosis.

Status post L4-S1 fusion. The central canal and foramina are widely
patent.

## 2020-08-27 ENCOUNTER — Encounter: Payer: Self-pay | Admitting: Emergency Medicine

## 2020-08-27 ENCOUNTER — Ambulatory Visit (INDEPENDENT_AMBULATORY_CARE_PROVIDER_SITE_OTHER): Payer: 59 | Admitting: Emergency Medicine

## 2020-08-27 ENCOUNTER — Other Ambulatory Visit: Payer: Self-pay

## 2020-08-27 VITALS — BP 117/73 | HR 96 | Temp 97.6°F | Resp 16 | Ht 60.0 in | Wt 178.0 lb

## 2020-08-27 DIAGNOSIS — E785 Hyperlipidemia, unspecified: Secondary | ICD-10-CM

## 2020-08-27 DIAGNOSIS — K2100 Gastro-esophageal reflux disease with esophagitis, without bleeding: Secondary | ICD-10-CM | POA: Diagnosis not present

## 2020-08-27 DIAGNOSIS — Z7689 Persons encountering health services in other specified circumstances: Secondary | ICD-10-CM | POA: Diagnosis not present

## 2020-08-27 DIAGNOSIS — G894 Chronic pain syndrome: Secondary | ICD-10-CM | POA: Diagnosis not present

## 2020-08-27 DIAGNOSIS — R7303 Prediabetes: Secondary | ICD-10-CM | POA: Diagnosis not present

## 2020-08-27 DIAGNOSIS — Z8719 Personal history of other diseases of the digestive system: Secondary | ICD-10-CM | POA: Insufficient documentation

## 2020-08-27 DIAGNOSIS — Z8639 Personal history of other endocrine, nutritional and metabolic disease: Secondary | ICD-10-CM

## 2020-08-27 NOTE — Progress Notes (Signed)
[name] 55 y.o.   Chief Complaint  Patient presents with  . Transitions Of Care   ASSESSMENT   [name] is a 55 y.o. extremely pleasant former patient of Dr. Ellouise Newer female with History of Lumbar post-laminectomy syndrome (h/o L4-L5 PLIF in 2006, 2008, 2010; prior SCS therapy without any significant relief), panic attacks, cervical radicular symptoms s/p CESI, DDD, multiple steroid injections which have not helped (has Cushingoid facies) and currently on opiate therapy for chronic pain.  PLAN   Our recommendation for the management of pain is a multimodal approach that may involve medications, interventions, complementary therapies such as Physical Therapy, Cognitive Behavioral therapy and referral to other appropriate services as indicated.  Interventions/ Injections  [name] at this time given prior reactions to steroid injections (cushingoid Pathology)  Blood thinner: [name]  Medications  Increase Oxycodone to 10mg  q6hrs prn, we will stop at this dose for the foreseeable future  Cymbalta 60 mg - stopped due to side effects of dizziness and confusion, no plans to restart  The patient has previously taken the following analgesic medications: -Opioids: Oxycodone/acetaminophen -Anti-epileptic drugs: Gabapentin, Lyrica (previous poor reaction) -Muscle relaxants: Flexeril -SNRI and TCA: Cymbalta (did not tolerate) -NSAIDs: Multiple OTC -Topical compounds: [name] -Others: [name]     HISTORY OF PRESENT ILLNESS: This is a 55 y.o. female former patient of Dr. Pamella Pert here to establish care with me. Has the following chronic medical problems: 1.  Chronic pain syndrome: Sees pain management doctor on a regular basis.  Last office visit notes reviewed.  See above. On chronic opiate medication.  Side effects: Chronic constipation 2.  Dyslipidemia: On atorvastatin 20 mg daily. 3.  Prediabetes: On Metformin 500 mg daily 4.  History of Cushing's disease: Was  seen by endocrinologist about 1 year ago.  No treatment.  No clear reason for condition. Up-to-date with mammogram and colonoscopy.  Last colonoscopy results reviewed with patient.  It showed mild diverticulosis, one polyp, and no other significant finding. 5.  History of GERD: Responded well to pantoprazole Today has no complaints or medical concerns. Recent blood work results reviewed with patient.  It showed prediabetes with hemoglobin A1c of 6.2, and abnormal lipid profile with increase LDL.  1 liver enzyme slightly increased. 6.  History of fatty liver   HPI   Prior to Admission medications   Medication Sig Start Date End Date Taking? Authorizing Provider  atorvastatin (LIPITOR) 20 MG tablet TAKE 1 TABLET(20 MG) BY MOUTH DAILY 02/28/20   Jacelyn Pi, Lilia Argue, MD  Calcium Carbonate-Vitamin D (CALTRATE 600+D PO) Take by mouth.    [provider]  cholecalciferol (VITAMIN D3) 25 MCG (1000 UNIT) tablet Take 3 tablets (3,000 Units total) by mouth daily. 02/28/20   Daleen Squibb, MD  famotidine (PEPCID) 20 MG tablet Take 1 tablet (20 mg total) by mouth 2 (two) times daily. 08/28/19   Jacelyn Pi, Lilia Argue, MD  metFORMIN (GLUCOPHAGE) 500 MG tablet Take 1 tablet (500 mg total) by mouth daily with breakfast. 02/28/20   Jacelyn Pi, Lilia Argue, MD  Misc. Devices (BATH/SHOWER SEAT) MISC Use shower chair/seat while bathing 05/02/18   Jacelyn Pi, Lilia Argue, MD  ondansetron (ZOFRAN ODT) 4 MG disintegrating tablet Take 1 tablet (4 mg total) by mouth every 8 (eight) hours as needed for nausea or vomiting. 06/25/20   Horald Pollen, MD  oxyCODONE (OXY IR/ROXICODONE) 5 MG immediate release tablet Take 1 tablet (5 mg total) by mouth every 6 (six) hours as  needed for severe pain. 02/08/18   Daleen Squibb, MD  pantoprazole (PROTONIX) 40 MG tablet Take 1 tablet (40 mg total) by mouth daily. 08/06/20   Horald Pollen, MD  tiZANidine (ZANAFLEX) 2 MG tablet Take by mouth 3 (three)  times daily.    [provider]    Allergies  Allergen Reactions  . Latex Swelling  . Other     LATEX, kiwi, papaya, fish  . Macrodantin [Nitrofurantoin] Rash and Other (See Comments)    Rash, pain behind eyes    Patient Active Problem List   Diagnosis Date Noted  . Fatty liver 01/16/2019  . Cushingoid facies 10/17/2018  . Acanthosis nigricans 10/17/2018  . Hypertriglyceridemia 10/17/2018  . Adenomatous colon polyp   . Degenerative disc disease, cervical   . Fatty liver disease, nonalcoholic   . Prediabetes   . Vitamin D deficiency   . Lumbosacral radiculopathy 06/04/2012  . Pain, chronic due to trauma 06/04/2012  . History of colonic polyps 09/29/2011  . Fibromyalgia   . Hypercholesterolemia   . DYSPEPSIA, CHRONIC 11/06/2008  . ALLERGIC RHINITIS 10/22/2008  . Hyperlipidemia 09/15/2007    Past Medical History:  Diagnosis Date  . Acid reflux   . Adenomatous colon polyp    2012  . Anxiety   . Constipation, chronic   . Degenerative disc disease, cervical    s/p epidural injections  . Depression   . Fatty liver disease, nonalcoholic    per ultrasound  . Fibromyalgia   . Hypercholesterolemia   . NSVD (normal spontaneous vaginal delivery)    X2  . Prediabetes    a1c 6.1  . Seizures ([place])    with dental procedure  . Vitamin D deficiency     Past Surgical History:  Procedure Laterality Date  . BACK SURGERY  2008  . BREAST SURGERY     REDUCTIVE MAMMOPLASTY  . herniated disc    . LAPAROSCOPIC CHOLECYSTECTOMY    . LUMBAR DISC SURGERY     L-4 AND L-5   . PELVIC LAPAROSCOPY     BTSP  . TONSILLECTOMY AND ADENOIDECTOMY    . VAGINAL HYSTERECTOMY     Post Repair/TVT Sling    Social History   Socioeconomic History  . Marital status: Married    Spouse name: Not on file  . Number of children: 2  . Years of education: Not on file  . Highest education level: Not on file  Occupational History    Employer: [employer]  Tobacco Use  . Smoking  status: Never Smoker  . Smokeless tobacco: Never Used  Vaping Use  . Vaping Use: Never used  Substance and Sexual Activity  . Alcohol use: No  . Drug use: No  . Sexual activity: Not Currently    Partners: Male    Comment: HYST  Other Topics Concern  . Not on file  Social History Narrative  . Not on file   Social Determinants of Health   Financial Resource Strain: Not on file  Food Insecurity: Not on file  Transportation Needs: Not on file  Physical Activity: Not on file  Stress: Not on file  Social Connections: Not on file  Intimate Partner Violence: Not on file    Family History  Problem Relation Age of Onset  . Hypertension Sister   . Cancer Sister        colon ca... stepsister  . Colon cancer Sister      Review of Systems  Constitutional: Negative.  Negative for chills and fever.  HENT: Negative.  Negative for congestion and sore throat.   Respiratory: Negative.  Negative for cough and shortness of breath.   Cardiovascular: Negative.  Negative for chest pain and palpitations.  Gastrointestinal: Positive for constipation and heartburn. Negative for abdominal pain, blood in stool, diarrhea, melena, nausea and vomiting.  Genitourinary: Negative.  Negative for dysuria and hematuria.  Musculoskeletal: Positive for back pain and joint pain. Negative for myalgias and neck pain.  Skin: Negative.  Negative for rash.  Neurological: Negative.  Negative for dizziness and headaches.  All other systems reviewed and are negative.  Today's Vitals   08/27/20 0808  BP: 117/73  Pulse: 96  Resp: 16  Temp: 97.6 F (36.4 C)  TempSrc: Temporal  SpO2: 98%  Weight: 178 lb (80.7 kg)  Height: 5' (1.524 m)   Body mass index is 34.76 kg/m. Wt Readings from Last 3 Encounters:  08/27/20 178 lb (80.7 kg)  08/06/20 177 lb (80.3 kg)  06/25/20 177 lb (80.3 kg)     Physical Exam Vitals reviewed.  Constitutional:      Comments: Cushingoid appearance with central obesity  HENT:      Head: Normocephalic.  Eyes:     Extraocular Movements: Extraocular movements intact.     Conjunctiva/sclera: Conjunctivae normal.     Pupils: Pupils are equal, round, and reactive to light.  Cardiovascular:     Rate and Rhythm: Normal rate and regular rhythm.     Pulses: Normal pulses.     Heart sounds: Normal heart sounds.  Pulmonary:     Effort: Pulmonary effort is normal.     Breath sounds: Normal breath sounds.  Abdominal:     Palpations: Abdomen is soft.     Tenderness: There is no abdominal tenderness.  Musculoskeletal:        General: Normal range of motion.     Cervical back: Normal range of motion and neck supple.  Skin:    General: Skin is warm and dry.     Capillary Refill: Capillary refill takes less than 2 seconds.  Neurological:     General: No focal deficit present.     Mental Status: She is alert and oriented to person, place, and time.  Psychiatric:        Mood and Affect: Mood normal.        Behavior: Behavior normal.     A total of 40 minutes was spent with the patient, greater than 50% of which was in counseling/coordination of care regarding establishing care with me, review of multiple chronic medical problems, treatment and management, review of all medications, review of most recent blood work results, review of most recent colonoscopy report, review of most recent pain management office visit notes, education on nutrition, health maintenance items, need for follow-up with endocrinologist for evaluation of Cushing's disease, prognosis, documentation and need for follow-up.   ASSESSMENT & PLAN: Prediabetes Most recent hemoglobin A1c is 6.2.  Increase Metformin to 500 mg twice a day. Diet and nutrition discussed.  History of Cushing disease No treatment.  Needs evaluation by endocrinologist.  History of fatty infiltration of liver Diet and nutrition discussed.  Gastroesophageal reflux disease with esophagitis without hemorrhage Well-controlled.   Responded well to pantoprazole.  Shawnae was seen today for transitions of care.  Diagnoses and all orders for this visit:  Encounter to establish care  Prediabetes  Chronic pain syndrome  Gastroesophageal reflux disease with esophagitis without hemorrhage  History of Cushing disease -  Ambulatory referral to Endocrinology  History of fatty infiltration of liver  Dyslipidemia    Patient Instructions   Increase Metformin to 500 mg twice a day. Continue all other medications. Follow-up with endocrinologist. Follow-up with me in 6 months.    If you have lab work done today you will be contacted with your lab results within the next 2 weeks.  If you have not heard from [place] then please contact us. The fastest way to get your results is to register for My Chart.   IF you received an x-ray today, you will receive an invoice from [facility]. Please contact [facility] at [phone] with questions or concerns regarding your invoice.   IF you received labwork today, you will receive an invoice from [place]. Please contact [facility] at [phone] with questions or concerns regarding your invoice.   Our billing staff will not be able to assist you with questions regarding bills from these companies.  You will be contacted with the lab results as soon as they are available. The fastest way to get your results is to activate your My Chart account. Instructions are located on the last page of this paperwork. If you have not heard from [place] regarding the results in 2 weeks, please contact this office.     Mantenimiento de Technical sales engineer en [place] Maintenance, Female Adoptar un estilo de vida saludable y recibir atencin preventiva son importantes para promover la salud y Musician. Consulte al mdico sobre:  El esquema adecuado para hacerse pruebas y exmenes peridicos.  Cosas que puede hacer por su cuenta para prevenir enfermedades y [employer]. Qu debo saber sobre la dieta, el peso y el ejercicio? Consuma una dieta saludable  Consuma una dieta que incluya muchas verduras, frutas, productos lcteos con bajo contenido de [place] y Advertising account planner.  No consuma muchos alimentos ricos en grasas slidas, azcares agregados o sodio.   Mantenga un peso saludable El ndice de masa muscular [facility]) se [place] para identificar problemas de [place]. Proporciona una estimacin de la grasa corporal basndose en el peso y la altura. Su mdico puede ayudarle a Radiation protection practitioner [place] y a Scientist, forensic o Theatre manager un peso saludable. Haga ejercicio con regularidad Haga ejercicio con regularidad. Esta es una de las prcticas ms importantes que puede hacer por su salud. La mayora de los adultos deben seguir estas pautas:  Optometrist, al menos, 144minutos de actividad fsica por semana. El ejercicio debe aumentar la frecuencia cardaca y Nature conservation officer transpirar (ejercicio de intensidad moderada).  Hacer ejercicios de fortalecimiento por lo [employer] por semana. Agregue esto a su plan de ejercicio de intensidad moderada.  Pasar menos tiempo sentados. Incluso la actividad fsica ligera puede ser beneficiosa. Controle sus niveles de colesterol y lpidos en la sangre Comience a realizarse anlisis de lpidos y Research officer, trade union en la sangre a los 20aos y luego reptalos cada 5aos. Hgase controlar los niveles de colesterol con mayor frecuencia si:  Sus niveles de lpidos y colesterol son altos.  Es mayor de 40aos.  Presenta un alto riesgo de padecer enfermedades cardacas. Qu debo saber sobre las pruebas de deteccin del cncer? Segn su historia clnica y sus antecedentes familiares, es posible que deba realizarse pruebas de deteccin del cncer en diferentes edades. Esto puede incluir pruebas de deteccin de lo siguiente:  Cncer de mama.  Cncer de cuello uterino.  Cncer colorrectal.  Cncer de piel.  Cncer de pulmn. Qu debo saber sobre la  enfermedad cardaca, la diabetes y  la hipertensin arterial? Presin arterial y enfermedad cardaca  La hipertensin arterial causa enfermedades cardacas y [place] el riesgo de accidente cerebrovascular. Es ms probable que esto se manifieste en las personas que tienen lecturas de presin arterial alta, tienen ascendencia africana o tienen sobrepeso.  Hgase controlar la presin arterial: ? Cada 3 a 5 aos si tiene entre 18 y 37 aos. ? Todos los aos si es mayor de [place]. Diabetes Realcese exmenes de deteccin de la diabetes con regularidad. Este anlisis revisa el nivel de azcar en la sangre en [place]. Hgase las pruebas de deteccin:  Cada tresaos despus de los 82aos de edad si tiene un peso normal y un bajo riesgo de padecer diabetes.  Con ms frecuencia y a partir de [place] edad inferior si tiene sobrepeso o un alto riesgo de padecer diabetes. Qu debo saber sobre la prevencin de infecciones? Hepatitis B Si tiene un riesgo ms alto de contraer hepatitis B, debe someterse a un examen de deteccin de este virus. Hable con el mdico para averiguar si tiene riesgo de contraer la infeccin por hepatitis B. Hepatitis C Se recomienda el anlisis a:  [employer] 1945 y 1965.  Todas las personas que tengan un riesgo de haber contrado hepatitis C. Enfermedades de transmisin sexual (ETS)  Hgase las pruebas de Programme researcher, broadcasting/film/video de ITS, incluidas la gonorrea y la clamidia, si: ? Es sexualmente activa y es menor de [place]. ? Es mayor de 24aos, y Investment banker, operational informa que corre riesgo de tener este tipo de infecciones. ? La actividad sexual ha cambiado desde que le hicieron la ltima prueba de deteccin y tiene un riesgo mayor de Best boy clamidia o Radio broadcast assistant. Pregntele al mdico si usted tiene riesgo.  Pregntele al mdico si usted tiene un alto riesgo de Museum/gallery curator VIH. El mdico tambin puede recomendarle un medicamento recetado para ayudar a evitar la infeccin por el VIH.  Si elige tomar medicamentos para prevenir el VIH, primero debe [employer] de deteccin del VIH. Luego debe hacerse anlisis cada 20meses mientras est tomando los medicamentos. Embarazo  Si est por dejar de Librarian, academic (fase premenopusica) y usted puede quedar [place], busque asesoramiento antes de [place].  Tome de 400 a [id number] (mcg) de cido [employer] si [place].  Pida mtodos de control de la natalidad (anticonceptivos) si desea evitar un embarazo no deseado. Osteoporosis y [place] La osteoporosis es una enfermedad en la que los huesos pierden los minerales y la fuerza por el avance de la edad. El resultado pueden ser fracturas en los [place]. Si tiene 65aos o ms, o si est en riesgo de sufrir osteoporosis y fracturas, pregunte a su mdico si debe:  Hacerse pruebas de deteccin de prdida sea.  Tomar un suplemento de calcio o de vitamina D para reducir el riesgo de fracturas.  Recibir terapia de reemplazo hormonal (TRH) para tratar los sntomas de la menopausia. Siga estas instrucciones en su casa: Estilo de vida  No consuma ningn producto que contenga nicotina o tabaco, como cigarrillos, cigarrillos electrnicos y tabaco de Higher education careers adviser. Si necesita ayuda para dejar de fumar, consulte al mdico.  No consuma drogas.  No comparta agujas.  Solicite ayuda a su mdico si necesita apoyo o informacin para abandonar las drogas. Consumo de alcohol  No beba alcohol si: ? Su mdico le indica no hacerlo. ? Est embarazada, puede estar embarazada o est tratando de quedar embarazada.  Si bebe alcohol: ? Limite la cantidad que consume de 0  a 1 medida por da. ? Limite la ingesta si est amamantando.  Est atento a la cantidad de alcohol que hay en las bebidas que toma. En los [place], una medida equivale a una botella de cerveza de 12oz (315ml), un vaso de vino de 5oz (154ml) o un vaso de una bebida alcohlica de alta  graduacin de 1oz (18ml). Instrucciones generales  Realcese los estudios de rutina de la salud, dentales y de Public librarian.  [place].  Infrmele a su mdico si: ? Se siente deprimida con frecuencia. ? Alguna vez ha sido vctima de [place] o no se siente segura en su casa. Resumen  Adoptar un estilo de vida saludable y recibir atencin preventiva son importantes para promover la salud y Musician.  Siga las instrucciones del mdico acerca de una dieta saludable, el ejercicio y la realizacin de pruebas o exmenes para Engineer, building services.  Siga las instrucciones del mdico con respecto al control del colesterol y la presin arterial. Esta informacin no tiene Marine scientist el consejo del mdico. Asegrese de hacerle al mdico cualquier pregunta que tenga. Document Revised: 06/21/2018 Document Reviewed: 06/21/2018 Elsevier Patient Education  2021 Elsevier Inc.      Agustina Caroli, MD [facility]

## 2020-08-27 NOTE — Assessment & Plan Note (Signed)
Most recent hemoglobin A1c is 6.2.  Increase Metformin to 500 mg twice a day. Diet and nutrition discussed.

## 2020-08-27 NOTE — Assessment & Plan Note (Signed)
Well-controlled.  Responded well to pantoprazole.

## 2020-08-27 NOTE — Assessment & Plan Note (Signed)
Diet and nutrition discussed. 

## 2020-08-27 NOTE — Patient Instructions (Addendum)
Increase Metformin to 500 mg twice a day. Continue all other medications. Follow-up with endocrinologist. Follow-up with me in 6 months.    If you have lab work done today you will be contacted with your lab results within the next 2 weeks.  If you have not heard from [place] then please contact us. The fastest way to get your results is to register for My Chart.   IF you received an x-ray today, you will receive an invoice from [facility]. Please contact [facility] at [phone] with questions or concerns regarding your invoice.   IF you received labwork today, you will receive an invoice from [facility]. Please contact [facility] at [phone] with questions or concerns regarding your invoice.   Our billing staff will not be able to assist you with questions regarding bills from these companies.  You will be contacted with the lab results as soon as they are available. The fastest way to get your results is to activate your My Chart account. Instructions are located on the last page of this paperwork. If you have not heard from [place] regarding the results in 2 weeks, please contact this office.     Mantenimiento de Technical sales engineer en [place] Maintenance, Female Adoptar un estilo de vida saludable y recibir atencin preventiva son importantes para promover la salud y Musician. Consulte al mdico sobre:  El esquema adecuado para hacerse pruebas y exmenes peridicos.  Cosas que puede hacer por su cuenta para prevenir enfermedades y [employer]. Qu debo saber sobre la dieta, el peso y el ejercicio? Consuma una dieta saludable  Consuma una dieta que incluya muchas verduras, frutas, productos lcteos con bajo contenido de [place] y Advertising account planner.  No consuma muchos alimentos ricos en grasas slidas, azcares agregados o sodio.   Mantenga un peso saludable El ndice de masa muscular [facility] se [place] para identificar problemas de [place]. Proporciona una  estimacin de la grasa corporal basndose en el peso y la altura. Su mdico puede ayudarle a Radiation protection practitioner [place] y a Scientist, forensic o Theatre manager un peso saludable. Haga ejercicio con regularidad Haga ejercicio con regularidad. Esta es una de las prcticas ms importantes que puede hacer por su salud. La mayora de los adultos deben seguir estas pautas:  Optometrist, al menos, 150minutos de actividad fsica por semana. El ejercicio debe aumentar la frecuencia cardaca y Nature conservation officer transpirar (ejercicio de intensidad moderada).  Hacer ejercicios de fortalecimiento por lo [employer] por semana. Agregue esto a su plan de ejercicio de intensidad moderada.  Pasar menos tiempo sentados. Incluso la actividad fsica ligera puede ser beneficiosa. Controle sus niveles de colesterol y lpidos en la sangre Comience a realizarse anlisis de lpidos y Research officer, trade union en la sangre a los 20aos y luego reptalos cada 5aos. Hgase controlar los niveles de colesterol con mayor frecuencia si:  Sus niveles de lpidos y colesterol son altos.  Es mayor de 40aos.  Presenta un alto riesgo de padecer enfermedades cardacas. Qu debo saber sobre las pruebas de deteccin del cncer? Segn su historia clnica y sus antecedentes familiares, es posible que deba realizarse pruebas de deteccin del cncer en diferentes edades. Esto puede incluir pruebas de deteccin de lo siguiente:  Cncer de mama.  Cncer de cuello uterino.  Cncer colorrectal.  Cncer de piel.  Cncer de pulmn. Qu debo saber sobre la enfermedad cardaca, la diabetes y la hipertensin arterial? Presin arterial y enfermedad cardaca  La hipertensin arterial causa enfermedades cardacas y [place] el riesgo de accidente  cerebrovascular. Es ms probable que esto se manifieste en las personas que tienen lecturas de presin arterial alta, tienen ascendencia africana o tienen sobrepeso.  Hgase controlar la presin arterial: ? Cada 3 a 5 aos si tiene entre  18 y 1 aos. ? Todos los aos si es mayor de [place]. Diabetes Realcese exmenes de deteccin de la diabetes con regularidad. Este anlisis revisa el nivel de azcar en la sangre en [place]. Hgase las pruebas de deteccin:  Cada tresaos despus de los 39aos de edad si tiene un peso normal y un bajo riesgo de padecer diabetes.  Con ms frecuencia y a partir de [place] edad inferior si tiene sobrepeso o un alto riesgo de padecer diabetes. Qu debo saber sobre la prevencin de infecciones? Hepatitis B Si tiene un riesgo ms alto de contraer hepatitis B, debe someterse a un examen de deteccin de este virus. Hable con el mdico para averiguar si tiene riesgo de contraer la infeccin por hepatitis B. Hepatitis C Se recomienda el anlisis a:  [employer] 1945 y 1965.  Todas las personas que tengan un riesgo de haber contrado hepatitis C. Enfermedades de transmisin sexual (ETS)  Hgase las pruebas de Programme researcher, broadcasting/film/video de ITS, incluidas la gonorrea y la clamidia, si: ? Es sexualmente activa y es menor de [place]. ? Es mayor de 24aos, y Investment banker, operational informa que corre riesgo de tener este tipo de infecciones. ? La actividad sexual ha cambiado desde que le hicieron la ltima prueba de deteccin y tiene un riesgo mayor de Best boy clamidia o Radio broadcast assistant. Pregntele al mdico si usted tiene riesgo.  Pregntele al mdico si usted tiene un alto riesgo de Museum/gallery curator VIH. El mdico tambin puede recomendarle un medicamento recetado para ayudar a evitar la infeccin por el VIH. Si elige tomar medicamentos para prevenir el VIH, primero debe [employer] de deteccin del VIH. Luego debe hacerse anlisis cada 72meses mientras est tomando los medicamentos. Embarazo  Si est por dejar de Librarian, academic (fase premenopusica) y usted puede quedar [place], busque asesoramiento antes de [place].  Tome de 400 a [id number] (mcg) de cido [employer] si [place].  Pida mtodos de control de la natalidad (anticonceptivos) si desea evitar un embarazo no deseado. Osteoporosis y [place] La osteoporosis es una enfermedad en la que los huesos pierden los minerales y la fuerza por el avance de la edad. El resultado pueden ser fracturas en los [place]. Si tiene 65aos o ms, o si est en riesgo de sufrir osteoporosis y fracturas, pregunte a su mdico si debe:  Hacerse pruebas de deteccin de prdida sea.  Tomar un suplemento de calcio o de vitamina D para reducir el riesgo de fracturas.  Recibir terapia de reemplazo hormonal (TRH) para tratar los sntomas de la menopausia. Siga estas instrucciones en su casa: Estilo de vida  No consuma ningn producto que contenga nicotina o tabaco, como cigarrillos, cigarrillos electrnicos y tabaco de Higher education careers adviser. Si necesita ayuda para dejar de fumar, consulte al mdico.  No consuma drogas.  No comparta agujas.  Solicite ayuda a su mdico si necesita apoyo o informacin para abandonar las drogas. Consumo de alcohol  No beba alcohol si: ? Su mdico le indica no hacerlo. ? Est embarazada, puede estar embarazada o est tratando de quedar embarazada.  Si bebe alcohol: ? Limite la cantidad que consume de 0 a 1 medida por da. ? Limite la ingesta si est amamantando.  Est atento a la cantidad de alcohol  que hay en las bebidas que toma. En los [place], una medida equivale a una botella de cerveza de 12oz (359ml), un vaso de vino de 5oz (160ml) o un vaso de una bebida alcohlica de alta graduacin de 1oz (64ml). Instrucciones generales  Realcese los estudios de rutina de la salud, dentales y de Public librarian.  [place].  Infrmele a su mdico si: ? Se siente deprimida con frecuencia. ? Alguna vez ha sido vctima de Scissors o no se siente segura en su casa. Resumen  Adoptar un estilo de vida saludable y recibir atencin preventiva son importantes para promover la  salud y Musician.  Siga las instrucciones del mdico acerca de una dieta saludable, el ejercicio y la realizacin de pruebas o exmenes para Engineer, building services.  Siga las instrucciones del mdico con respecto al control del colesterol y la presin arterial. Esta informacin no tiene Marine scientist el consejo del mdico. Asegrese de hacerle al mdico cualquier pregunta que tenga. Document Revised: 06/21/2018 Document Reviewed: 06/21/2018 Elsevier Patient Education  [place].

## 2020-08-27 NOTE — Assessment & Plan Note (Signed)
No treatment.  Needs evaluation by endocrinologist.

## 2020-09-10 ENCOUNTER — Encounter: Payer: Self-pay | Admitting: Emergency Medicine

## 2020-09-10 MED ORDER — METFORMIN HCL 500 MG PO TABS: 500.0000 mg | ORAL_TABLET | Freq: Every day | ORAL | 0 refills | Status: DC

## 2020-10-01 ENCOUNTER — Encounter: Payer: Self-pay | Admitting: Internal Medicine

## 2020-10-01 ENCOUNTER — Other Ambulatory Visit: Payer: Self-pay

## 2020-10-01 ENCOUNTER — Ambulatory Visit (INDEPENDENT_AMBULATORY_CARE_PROVIDER_SITE_OTHER): Payer: 59 | Admitting: Internal Medicine

## 2020-10-01 VITALS — BP 140/82 | HR 95 | Ht 60.0 in | Wt 178.1 lb

## 2020-10-01 DIAGNOSIS — R7989 Other specified abnormal findings of blood chemistry: Secondary | ICD-10-CM

## 2020-10-01 LAB — CORTISOL: Cortisol, Plasma: 8.3 ug/dL

## 2020-10-01 NOTE — Patient Instructions (Signed)
SALIVARY CORTISOL COLLECTION INSTRUCTIONS    Precautions:  1. Please collect sample at [place] . You will need to do this on 2 nights  2. No food or fluids 30 minutes prior to collection.  3. Do not use any creams, lotions on hands, or use steroid inhalers 24- hours prior to collection.  4. Wash hands carefully.  5. Avoid any activity that could cause your gums to bleed: including flushing of brushing your teeth.  6. Kit must not be used in children less than 84 years of age, or a person that is at risk for choking on collection kit.  Instructions for saliva collection:   1. Rinse mouth thoroughly with water and discard. Do not swallow.  2. Hold the Salivette at the rim of the suspended insert and remove the stopper.  3. Remove the swab.  4. Place swab under tongue until well saturated, approximately 1 minute.   5. Return the saturated swab to the suspended insert and close the Salivette firmly with the stopper.  6. Do not remove the tube holding the insert. The Salivette should be sent to the lab with the swab.   7. Come to the lab and leave the Salivette kit for labeling with your identifying information.   8. Make sure you refrigerate sample if not bringing to the lab immediately. Try to use cold packs for transportation if available.       24-Hour Urine Collection   You will be collecting your urine for a 24-hour period of time.  Your timer starts with your first urine of the morning (For example - If you first pee at [place], your timer will start at [place])  [place] away your first urine of the morning  Collect your urine every time you pee for the next 24 hours STOP your urine collection 24 hours after you started the collection (For example - You would stop at 9AM the day after you started)

## 2020-10-01 NOTE — Progress Notes (Signed)
Name: [name]  Age/ Sex: 55 y.o., female   MRN/ DOB: [id number], 07-Aug-1965     PCP: Horald Pollen, MD   Reason for Endocrinology Evaluation: Type 2 Diabetes Mellitus  Initial Endocrine Consultative Visit: 10/17/18    PATIENT IDENTIFIER: [name] is a 55 y.o. female with a past medical history of obesity, dyslipidemia, fatty liver , chronic pain syndrome and prediabetes. . The patient has followed with Endocrinology clinic since 10/17/2018 for consultative assistance with management of her diabetes.    She was initially referred to me for concerns of adrenal insufficiency due to hyperpigmentation of the skin.  Patient has noted darkening of the skin around her chin area ~ a year ago. Since then its speading to the rest of her face, neck , inner elbows and abdominal area. No itching or burning .    She has been diagnosed with prediabetes for a few years, she is not following a certain diet nor is she able to exercise due to back pain. She also has fatty liver since at least 2009 but LFT's have been trending down  She also  Hypertriglyceridemia since 2009, currently on Pravastatin. Confirms compliance with no side effects.   Due to her chronic pain syndrome she received multiple steroid injections over the years, in 2019 she estimates received somewhere between 4-5 glucocortoid injections a year  Her salivary cortisol was high and was thought to be due to contamination. We proceeded with 24- urine cortisol was normal at  < 50 mcg ( 41.4 ) .  Another abnormal salivary cortisol test 02/2019 but turns out the pt had spinal glucocorticoid injection just before that test     SUBJECTIVE:    Today (10/01/2020): [name] is here for follow up abnormal salivary cortisol.      Her weight has been stable  She is limited in her physical activity due to chronic pain.    Her last steroid injection was last year    Sees Dermatology for  hyperpigmentation of the face     HISTORY:  Past Medical History:  Past Medical History:  Diagnosis Date  . Acid reflux   . Adenomatous colon polyp    2012  . Anxiety   . Constipation, chronic   . Degenerative disc disease, cervical    s/p epidural injections  . Depression   . Fatty liver disease, nonalcoholic    per ultrasound  . Fibromyalgia   . Hypercholesterolemia   . NSVD (normal spontaneous vaginal delivery)    X2  . Prediabetes    a1c 6.1  . Seizures ([place])    with dental procedure  . Vitamin D deficiency    Past Surgical History:  Past Surgical History:  Procedure Laterality Date  . BACK SURGERY  2008  . BREAST SURGERY     REDUCTIVE MAMMOPLASTY  . herniated disc    . LAPAROSCOPIC CHOLECYSTECTOMY    . LUMBAR DISC SURGERY     L-4 AND L-5   . PELVIC LAPAROSCOPY     BTSP  . TONSILLECTOMY AND ADENOIDECTOMY    . VAGINAL HYSTERECTOMY     Post Repair/TVT Sling    Social History:  reports that she has never smoked. She has never used smokeless tobacco. She reports that she does not drink alcohol and does not use drugs. Family History:  Family History  Problem Relation Age of Onset  . Hypertension Sister   . Cancer Sister        colon  ca... stepsister  . Colon cancer Sister      HOME MEDICATIONS: Allergies as of 10/01/2020      Reactions   Latex Swelling   Other    LATEX, kiwi, papaya, fish   Macrodantin [nitrofurantoin] Rash, Other (See Comments)   Rash, pain behind eyes      Medication List       Accurate as of October 01, 2020  3:36 PM. If you have any questions, ask your nurse or doctor.        atorvastatin 20 MG tablet Commonly known as: LIPITOR TAKE 1 TABLET(20 MG) BY MOUTH DAILY   Bath/Shower Seat Misc Use shower chair/seat while bathing   CALTRATE 600+D PO Take by mouth.   cholecalciferol 25 MCG (1000 UNIT) tablet Commonly known as: VITAMIN D3 Take 3 tablets (3,000 Units total) by mouth daily.   famotidine 20 MG tablet Commonly  known as: PEPCID Take 1 tablet (20 mg total) by mouth 2 (two) times daily.   metFORMIN 500 MG tablet Commonly known as: GLUCOPHAGE Take 1 tablet (500 mg total) by mouth daily with breakfast. What changed: when to take this   ondansetron 4 MG disintegrating tablet Commonly known as: Zofran ODT Take 1 tablet (4 mg total) by mouth every 8 (eight) hours as needed for nausea or vomiting.   oxyCODONE 5 MG immediate release tablet Commonly known as: Oxy IR/ROXICODONE Take 1 tablet (5 mg total) by mouth every 6 (six) hours as needed for severe pain.   pantoprazole 40 MG tablet Commonly known as: PROTONIX Take 1 tablet (40 mg total) by mouth daily.   tiZANidine 2 MG tablet Commonly known as: ZANAFLEX Take by mouth 3 (three) times daily.        OBJECTIVE:   Vital Signs: BP 140/82   Pulse 95   Ht 5' (1.524 m)   Wt 178 lb 2 oz (80.8 kg)   SpO2 98%   BMI 34.79 kg/m   Wt Readings from Last 3 Encounters:  10/01/20 178 lb 2 oz (80.8 kg)  08/27/20 178 lb (80.7 kg)  08/06/20 177 lb (80.3 kg)     Exam: General: Pt appears well and is in NAD  Lungs: Clear with good BS bilat with no rales, rhonchi, or wheezes  Heart: RRR with normal S1 and S2 and no gallops; no murmurs; no rub  Abdomen: Normoactive bowel sounds, soft, nontender, without masses or organomegaly palpable  Extremities: No pretibial edema.  Neuro: MS is good with appropriate affect, pt is alert and Ox3     DATA REVIEWED:  [name], [name] (MRN [id number]) as of 10/02/2020 12:05  Ref. Range 10/01/2020 15:48  Cortisol, Plasma Latest Units: ug/dL 8.3    ASSESSMENT / PLAN / RECOMMENDATIONS:   1) Abnormal Salivary Cortisol Testing:   - Despite her phenotype I am unable to say with certainty that she had cushing syndrome.  - We have attempted to do salivary cortisol testing twice, the first time it was contaminated and the second time it was normal because she did it shortly after receiving a  steroid intra-articular injection  - Causes of false positives should be kept in mind  - She typically goes to be at midnight and this could be one of the causes of abnormal salivary cortisol due to alteration in the circadian rhythm  - Will proceed with 24-hr collection as well as repeat salivary cortisol  -Serum cortisol is normal   F/U pending results    Signed electronically by:  Abby Nena Jordan, MD  [facility]  [facility] [address] [place], [place] [address] Phone: [phone] FAX: [phone]   CC: Horald Pollen, MD [place] [address] Phone: [phone]  Fax: [phone]  Return to Endocrinology clinic as below: Future Appointments  Date Time Provider [place]  10/08/2020 11:00 AM Princess Bruins, MD [facility] None  11/13/2020 11:00 AM Cindra Presume, MD [facility] None  02/26/2021 10:20 AM Horald Pollen, MD [facility] None

## 2020-10-06 LAB — ACTH: C206 ACTH: 6 pg/mL (ref 6–50)

## 2020-10-07 ENCOUNTER — Other Ambulatory Visit: Payer: Self-pay

## 2020-10-07 ENCOUNTER — Other Ambulatory Visit (INDEPENDENT_AMBULATORY_CARE_PROVIDER_SITE_OTHER): Payer: 59

## 2020-10-07 DIAGNOSIS — R7989 Other specified abnormal findings of blood chemistry: Secondary | ICD-10-CM | POA: Diagnosis not present

## 2020-10-08 ENCOUNTER — Other Ambulatory Visit: Payer: Self-pay

## 2020-10-08 ENCOUNTER — Encounter: Payer: Self-pay | Admitting: Obstetrics & Gynecology

## 2020-10-08 ENCOUNTER — Ambulatory Visit (INDEPENDENT_AMBULATORY_CARE_PROVIDER_SITE_OTHER): Payer: 59 | Admitting: Obstetrics & Gynecology

## 2020-10-08 VITALS — BP 134/78 | Ht 59.5 in | Wt 178.4 lb

## 2020-10-08 DIAGNOSIS — Z780 Asymptomatic menopausal state: Secondary | ICD-10-CM

## 2020-10-08 DIAGNOSIS — R300 Dysuria: Secondary | ICD-10-CM

## 2020-10-08 DIAGNOSIS — M85851 Other specified disorders of bone density and structure, right thigh: Secondary | ICD-10-CM

## 2020-10-08 DIAGNOSIS — Z6835 Body mass index (BMI) 35.0-35.9, adult: Secondary | ICD-10-CM

## 2020-10-08 DIAGNOSIS — Z01419 Encounter for gynecological examination (general) (routine) without abnormal findings: Secondary | ICD-10-CM | POA: Diagnosis not present

## 2020-10-08 LAB — URINALYSIS, COMPLETE W/RFL CULTURE
Bacteria, UA: NONE SEEN /HPF
Bilirubin Urine: NEGATIVE
Glucose, UA: NEGATIVE
Hyaline Cast: NONE SEEN /LPF
Ketones, ur: NEGATIVE
Leukocyte Esterase: NEGATIVE
Nitrites, Initial: NEGATIVE
Protein, ur: NEGATIVE
RBC / HPF: NONE SEEN /HPF (ref 0–2)
Specific Gravity, Urine: 1.02 (ref 1.001–1.035)
pH: 5.5 (ref 5.0–8.0)

## 2020-10-08 NOTE — Progress Notes (Signed)
[name] [name] 1966/04/12 [id number]   History:    55 y.o. G2P2L2  Married.  Oldest is 55 yo.  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  Has grand-children.  RP:  Established patient presenting for annual gyn exam   HPI: Last annual/gyn exam with [name] in 07/2019.  2005 TVH for fibroids normal Pap history.  2008 breast reduction normal mammogram history, normal mammogram 07/2020 at [facility] report not in chart, patient reports. 2012 adenomatous polyp 2017 negative colonoscopy, 5-year follow-up.  Biggest problem is degenerative disc disease has had 3 surgeries in the last 3 years and has continued chronic back pain is now on disability.  Also has fibromyalgia.  Husband prostate cancer/ not sexually active. Primary care manages diabetes and hypercholesterolemia.  BMI 35.43.                                                                                                                                                                           Past medical history,surgical history, family history and social history were all reviewed and documented in the EPIC chart.  Gynecologic History No LMP recorded. Patient has had a hysterectomy.  Obstetric History OB History  Gravida Para Term Preterm AB Living  2 2 2    0 2  SAB IAB Ectopic Multiple Live Births      0   2    # Outcome Date GA Lbr Len/2nd Weight Sex Delivery Anes PTL Lv  2 Term     M Vag-Spont  N LIV  1 Term     F Vag-Spont  N LIV     ROS: A ROS was performed and pertinent positives and negatives are included in the history.  GENERAL: No fevers or chills. HEENT: No change in vision, no earache, sore throat or sinus congestion. NECK: No pain or stiffness. CARDIOVASCULAR: No chest pain or pressure. No palpitations. PULMONARY: No shortness of breath, cough or wheeze. GASTROINTESTINAL: No abdominal pain, nausea, vomiting or diarrhea, melena or bright red blood per rectum. GENITOURINARY: No urinary frequency, urgency, hesitancy or  dysuria. MUSCULOSKELETAL: No joint or muscle pain, no back pain, no recent trauma. DERMATOLOGIC: No rash, no itching, no lesions. ENDOCRINE: No polyuria, polydipsia, no heat or cold intolerance. No recent change in weight. HEMATOLOGICAL: No anemia or easy bruising or bleeding. NEUROLOGIC: No headache, seizures, numbness, tingling or weakness. PSYCHIATRIC: No depression, no loss of interest in normal activity or change in sleep pattern.     Exam:   BP 134/78   Ht 4' 11.5" (1.511 m)   Wt 178 lb 6.4 oz (80.9 kg)   BMI 35.43 kg/m   Body mass index is 35.43 kg/m.  General appearance : Well developed well  nourished female. No acute distress HEENT: Eyes: no retinal hemorrhage or exudates,  Neck supple, trachea midline, no carotid bruits, no thyroidmegaly Lungs: Clear to auscultation, no rhonchi or wheezes, or rib retractions  Heart: Regular rate and rhythm, no murmurs or gallops Breast:Examined in sitting and supine position were symmetrical in appearance, no palpable masses or tenderness,  no skin retraction, no nipple inversion, no nipple discharge, no skin discoloration, no axillary or supraclavicular lymphadenopathy Abdomen: no palpable masses or tenderness, no rebound or guarding Extremities: no edema or skin discoloration or tenderness  Pelvic: Vulva: Normal             Vagina: No gross lesions or discharge  Cervix/Uterus absent  Adnexa  Without masses or tenderness  Anus: Normal  U/A Completely negative   Assessment/Plan:  55 y.o. female for annual exam   1. Well female exam with routine gynecological exam Gynecologic exam status post total hysterectomy and menopause.  No indication for Pap test at this time.  Breast exam normal.  Screening mammogram February 2022 was negative.  We will repeat a colonoscopy this year.  Health labs with family physician.  2. Postmenopause Well on no hormone replacement therapy.  3. Dysuria Urine analysis completely negative, patient  reassured. - Urinalysis,Complete w/RFL Culture  4. Osteopenia of neck of right femur Very mild osteopenia localized at the right femoral neck with a T score of -1.1 in March 2021.  5. Class 2 severe obesity due to excess calories with serious comorbidity and body mass index (BMI) of 35.0 to 35.9 in adult [facility]) Recommend a lower calorie/carb diet.  Aerobic activities 5 times a week and light weightlifting every 2 days.  [name] Bruins MD, 11:29 AM 10/08/2020

## 2020-10-10 LAB — CORTISOL, URINE, 24 HOUR
24 Hour urine volume (VMAHVA): 2100 mL
CREATININE, URINE: 1.02 g/(24.h) (ref 0.50–2.15)
Cortisol (Ur), Free: 7.7 mcg/24 h (ref 4.0–50.0)

## 2020-10-11 ENCOUNTER — Encounter: Payer: Self-pay | Admitting: Obstetrics & Gynecology

## 2020-10-13 ENCOUNTER — Encounter: Payer: Self-pay | Admitting: Endocrinology

## 2020-10-20 LAB — SALIVARY CORTISOL X2, TIMED
Salivary Cortisol 2nd Specimen: 0.011 ug/dL
Salivary Cortisol Baseline: <0.01 ug/dL

## 2020-11-03 ENCOUNTER — Other Ambulatory Visit: Payer: Self-pay | Admitting: Emergency Medicine

## 2020-11-13 ENCOUNTER — Institutional Professional Consult (permissible substitution): Payer: 59 | Admitting: Plastic Surgery

## 2020-12-23 ENCOUNTER — Encounter: Payer: Self-pay | Admitting: Internal Medicine

## 2020-12-23 ENCOUNTER — Ambulatory Visit: Payer: 59 | Admitting: Internal Medicine

## 2020-12-23 VITALS — BP 128/80 | HR 86 | Ht 60.0 in | Wt 174.8 lb

## 2020-12-23 DIAGNOSIS — Z1211 Encounter for screening for malignant neoplasm of colon: Secondary | ICD-10-CM | POA: Diagnosis not present

## 2020-12-23 DIAGNOSIS — K5900 Constipation, unspecified: Secondary | ICD-10-CM

## 2020-12-23 DIAGNOSIS — R1013 Epigastric pain: Secondary | ICD-10-CM

## 2020-12-23 DIAGNOSIS — R140 Abdominal distension (gaseous): Secondary | ICD-10-CM

## 2020-12-23 DIAGNOSIS — K219 Gastro-esophageal reflux disease without esophagitis: Secondary | ICD-10-CM | POA: Diagnosis not present

## 2020-12-23 MED ORDER — NA SULFATE-K SULFATE-MG SULF 17.5-3.13-1.6 GM/177ML PO SOLN: ORAL | 0 refills | Status: DC

## 2020-12-23 NOTE — Progress Notes (Signed)
HISTORY OF PRESENT ILLNESS:  [name] is a 55 y.o. female, disabled secondary to chronic back pain for which she is on narcotics, who was last seen in this office November 18, 2019 regarding family history of colon cancer, personal history of adenomatous colon polyps due for surveillance, functional constipation, chronic GERD, epigastric pain postcholecystectomy, obesity, and mild elevation of ALT felt likely secondary to fatty liver.  See that dictation.  She was scheduled for colonoscopy and upper endoscopy.  However, she canceled her case is due to COVID infection.  She presents now with similar GI complaints and interested in following through with her procedures as previously recommended.  She is accompanied by a professional interpreter.  She tells me that she had continues with epigastric pain.  Her PCP diagnosed GERD and placed her on pantoprazole.  This is helped.  She still has some epigastric fullness and bloating.  No dysphagia.  Her last upper endoscopy was performed in 2012 to evaluate Hemoccult-positive stool, GERD, and vague dysphagia.  The exam was normal.  She also complains of ongoing constipation.  She takes 2 doses of MiraLAX daily.  She has a bowel movement about once every third day.  No bleeding.  Her family history is significant for a half sibling with colon cancer in their 10s.  The patient had a negative index exam in 2010.  In 2012 she underwent repeat colonoscopy for heme positive stool.  The terminal ileum was normal.  Mild sigmoid diverticulosis.  Small tubular adenoma removed.  Follow-up in 5 years recommended.  She has not followed up since.  Review of outside blood work from August 06, 2020 shows mild elevation of ALT at 47.  Otherwise normal liver test.  Otherwise normal comprehensive metabolic panel.  Normal CBC with hemoglobin 14.0.  CT scan from 2009 was unremarkable.  Patient's GI review of systems is otherwise negative  REVIEW OF SYSTEMS:  All non-GI ROS  negative unless otherwise stated in the HPI except for anxiety, arthritis, back pain, visual change, depression, fatigue, sleeping problems  Past Medical History:  Diagnosis Date   Acid reflux    Adenomatous colon polyp    2012   Anxiety    Constipation, chronic    Degenerative disc disease, cervical    s/p epidural injections   Depression    Fatty liver disease, nonalcoholic    per ultrasound   Fibromyalgia    Hypercholesterolemia    NSVD (normal spontaneous vaginal delivery)    X2   Prediabetes    a1c 6.1   Seizures ([place])    with dental procedure   Vitamin D deficiency     Past Surgical History:  Procedure Laterality Date   BACK SURGERY  2008   BREAST SURGERY     REDUCTIVE MAMMOPLASTY   herniated disc     LAPAROSCOPIC CHOLECYSTECTOMY     LUMBAR DISC SURGERY     L-4 AND L-5    PELVIC LAPAROSCOPY     BTSP   TONSILLECTOMY AND ADENOIDECTOMY     VAGINAL HYSTERECTOMY     Post Repair/TVT Sling    Social History [name]  reports that she has never smoked. She has never used smokeless tobacco. She reports that she does not drink alcohol and does not use drugs.  family history includes Cancer in her sister; Colon cancer in her sister; Hypertension in her sister.  Allergies  Allergen Reactions   Latex Swelling   Other     LATEX, kiwi, papaya, fish  Macrodantin [Nitrofurantoin] Rash and Other (See Comments)    Rash, pain behind eyes       PHYSICAL EXAMINATION: Vital signs: BP 128/80   Pulse 86   Ht 5' (1.524 m)   Wt 174 lb 12.8 oz (79.3 kg)   SpO2 99%   BMI 34.14 kg/m   Constitutional: generally well-appearing, no acute distress but moves gingerly due to back pain Psychiatric: alert and oriented x3, cooperative Eyes: extraocular movements intact, anicteric, conjunctiva pink Mouth: [name] pharynx moist, no lesions Neck: supple no lymphadenopathy Cardiovascular: heart regular rate and rhythm, no murmur Lungs: clear to auscultation  bilaterally Abdomen: soft, obese, nontender, nondistended, no obvious ascites, no peritoneal signs, normal bowel sounds, no organomegaly Rectal: Deferred until colonoscopy Extremities: no clubbing, cyanosis, or lower extremity edema bilaterally Skin: no lesions on visible extremities Neuro: No focal deficits.  Cranial nerves intact  ASSESSMENT:  1.  GERD.  Improvement in dyspeptic symptoms on PPI. 2.  Vague epigastric discomfort and bloating. 3.  Status post cholecystectomy 4.  Functional constipation.  Exacerbated by narcotics 5.  History of adenomatous colon polyp and family history of colon cancer.  Overdue for surveillance.  Last examination 2012   PLAN:  1.  Reflux precautions with attention to weight loss 2.  Continue PPI daily 3.  Schedule abdominal ultrasound to evaluate epigastric pain postcholecystectomy.   4.  Schedule upper endoscopy to evaluate epigastric pain.The nature of the procedure, as well as the risks, benefits, and alternatives were carefully and thoroughly reviewed with the patient. Ample time for discussion and questions allowed. The patient understood, was satisfied, and agreed to proceed.  5.  Schedule surveillance colonoscopy.The nature of the procedure, as well as the risks, benefits, and alternatives were carefully and thoroughly reviewed with the patient. Ample time for discussion and questions allowed. The patient understood, was satisfied, and agreed to proceed.  6.  Advised to take additional dosages of MiraLAX.  Titrate to achieve desired result.

## 2020-12-23 NOTE — Patient Instructions (Addendum)
You have been scheduled for an endoscopy and colonoscopy. Please follow the written instructions given to you at your visit today. Please pick up your prep supplies at the pharmacy within the next 1-3 days. If you use inhalers (even only as needed), please bring them with you on the day of your procedure.  You have been scheduled for an abdominal ultrasound at [facility] (1st floor of hospital) on Monday 12/29/20 at 11:00 am. Please arrive 15 minutes prior to your appointment for registration. Make certain not to have anything to eat or drink 6 hours prior to your appointment. Should you need to reschedule your appointment, please contact radiology at [phone]. This test typically takes about 30 minutes to perform.  If you are age 18 or younger, your body mass index should be between 19-25. Your Body mass index is 34.14 kg/m. If this is out of the aformentioned range listed, please consider follow up with your Primary Care Provider.  _________________________________________________________  The [facility] providers would like to encourage you to use [facility] to communicate with providers for non-urgent requests or questions.  Due to long hold times on the telephone, sending your provider a message by [facility] may be a faster and more efficient way to get a response.  Please allow 48 business hours for a response.  Please remember that this is for non-urgent requests.   Due to recent changes in healthcare laws, you may see the results of your imaging and laboratory studies on MyChart before your provider has had a chance to review them.  We understand that in some cases there may be results that are confusing or concerning to you. Not all laboratory results come back in the same time frame and the provider may be waiting for multiple results in order to interpret others.  Please give [place] 48 hours in order for your provider to thoroughly review all the results before contacting the office for  clarification of your results.  _______________________________________________________  Se le ha programado una endoscopia y [place] [facility]. Siga las instrucciones escritas que se le dieron en su visita de hoy. Recoja sus suministros de preparacin en la farmacia dentro de los prximos 1 a 3 das. Si [place] inhaladores (aunque solo sea necesario), trigalos el da de su procedimiento.  Ha sido programado para una ecografa abdominal en [facility] (primer piso del hospital) el lunes 18/7/22 a las 11:00 am. Llegue 15 minutos antes de su cita para registrarse. Asegrese de no comer ni beber nada 6 horas antes de su cita. Si necesita reprogramar su cita, comunquese con radiologa al [phone]. Esta prueba suele tardar unos 30 minutos en realizarse.   Si tiene 55 aos o menos, su ndice de [employer] corporal debe estar entre 28 y 55. Su ndice de masa corporal es de 34,14 kg/m. Si esto est fuera del rango mencionado anteriormente, considere hacer un seguimiento con su proveedor de Midwife. _________________________________________________________  [employer] de [facility] desean alentarlo a que use MYCHART para comunicarse con los proveedores para solicitudes o preguntas que no sean urgentes. Debido a los Astronomer de espera en el telfono, enviar un mensaje a su proveedor por [employer] puede ser una forma ms rpida y eficiente de obtener una respuesta. Espere 48 horas hbiles para obtener [employer]. Recuerde que esto es para solicitudes no urgentes.  Debido a los [employer] de atencin mdica, es posible que vea los [place] de sus estudios de imgenes y de laboratorio en  MyChart antes de que su proveedor haya tenido la oportunidad de revisarlos. Entendemos que en algunos casos puede haber resultados confusos o preocupantes para usted. No todos los resultados de laboratorio regresan en el mismo perodo de tiempo y el proveedor puede estar esperando  mltiples resultados para Primary school teacher. Denos 31 horas para que su proveedor revise minuciosamente todos los resultados antes de comunicarse con la oficina para Audiological scientist.

## 2020-12-24 ENCOUNTER — Encounter: Payer: Self-pay | Admitting: Plastic Surgery

## 2020-12-24 ENCOUNTER — Ambulatory Visit (INDEPENDENT_AMBULATORY_CARE_PROVIDER_SITE_OTHER): Payer: 59 | Admitting: Plastic Surgery

## 2020-12-24 ENCOUNTER — Other Ambulatory Visit: Payer: Self-pay

## 2020-12-24 VITALS — BP 126/80 | HR 84 | Ht 60.0 in | Wt 176.0 lb

## 2020-12-24 DIAGNOSIS — D170 Benign lipomatous neoplasm of skin and subcutaneous tissue of head, face and neck: Secondary | ICD-10-CM

## 2020-12-24 NOTE — Progress Notes (Signed)
Referring Provider Ashley Royalty, [address] [place],  [place] [id number]   CC:  Chief Complaint  Patient presents with   Advice Only      [name] is an 55 y.o. female.  HPI: Patient presents to discuss a mass on the right side of her neck.  Is been present for at least 7 years and seems to be growing in size.  She wants to make sure there is nothing worrisome and would like it to be removed if possible.  Allergies  Allergen Reactions   Latex Swelling   Other     LATEX, kiwi, papaya, fish   Macrodantin [Nitrofurantoin] Rash and Other (See Comments)    Rash, pain behind eyes    Outpatient Encounter Medications as of 12/24/2020  Medication Sig Note   atorvastatin (LIPITOR) 20 MG tablet TAKE 1 TABLET(20 MG) BY MOUTH DAILY    Calcium Carbonate-Vitamin D (CALTRATE 600+D PO) Take by mouth.    cholecalciferol (VITAMIN D3) 25 MCG (1000 UNIT) tablet Take 3 tablets (3,000 Units total) by mouth daily.    metFORMIN (GLUCOPHAGE) 500 MG tablet Take 1 tablet (500 mg total) by mouth 2 (two) times daily with a meal.    Misc. Devices (BATH/SHOWER SEAT) MISC Use shower chair/seat while bathing 08/27/2020: use   Na Sulfate-K Sulfate-Mg Sulf 17.5-3.13-1.6 GM/177ML SOLN 1 colonoscopy prep per instructions    oxyCODONE (OXY IR/ROXICODONE) 5 MG immediate release tablet Take 1 tablet (5 mg total) by mouth every 6 (six) hours as needed for severe pain. 08/06/2020: Take 10 mg as needed   pantoprazole (PROTONIX) 40 MG tablet Take 1 tablet (40 mg total) by mouth daily.    pregabalin (LYRICA) 50 MG capsule Take 50 mg by mouth 2 (two) times daily.    [DISCONTINUED] ondansetron (ZOFRAN ODT) 4 MG disintegrating tablet Take 1 tablet (4 mg total) by mouth every 8 (eight) hours as needed for nausea or vomiting.    [DISCONTINUED] tiZANidine (ZANAFLEX) 2 MG tablet Take by mouth 3 (three) times daily.    No facility-administered encounter medications on file as of 12/24/2020.      Past Medical History:  Diagnosis Date   Acid reflux    Adenomatous colon polyp    2012   Anxiety    Constipation, chronic    Degenerative disc disease, cervical    s/p epidural injections   Depression    Fatty liver disease, nonalcoholic    per ultrasound   Fibromyalgia    Hypercholesterolemia    NSVD (normal spontaneous vaginal delivery)    X2   Prediabetes    a1c 6.1   Seizures ([facility])    with dental procedure   Vitamin D deficiency     Past Surgical History:  Procedure Laterality Date   BACK SURGERY  2008   BREAST SURGERY     REDUCTIVE MAMMOPLASTY   herniated disc     LAPAROSCOPIC CHOLECYSTECTOMY     LUMBAR DISC SURGERY     L-4 AND L-5    PELVIC LAPAROSCOPY     BTSP   TONSILLECTOMY AND ADENOIDECTOMY     VAGINAL HYSTERECTOMY     Post Repair/TVT Sling    Family History  Problem Relation Age of Onset   Hypertension Sister    Cancer Sister        colon ca... stepsister   Colon cancer Sister     Social History   Social History Narrative   Not on file  Review of Systems General: Denies fevers, chills, weight loss CV: Denies chest pain, shortness of breath, palpitations  Physical Exam Vitals with BMI 12/24/2020 12/23/2020 10/08/2020  Height 5\' 0"  5\' 0"  4' 11.5"  Weight 176 lbs 174 lbs 13 oz 178 lbs 6 oz  BMI 34.37 03.01 49.96  Systolic 924 932 419  Diastolic 80 80 78  Pulse 84 86 -    General:  No acute distress,  Alert and oriented, Non-Toxic, Normal speech and affect Examination of the right neck shows a 3 to 4 cm transversely oriented soft tissue mass that seems to be freely mobile and subcutaneous.  No overlying skin changes.  Cranial nerves intact.  Assessment/Plan Patient presents with what I suspect is a subcutaneous lipoma in the right side of her neck.  I would however like to get an ultrasound of this before excising it to look for any other abnormalities and potentially get some more diagnostic information.  We discussed potential local  excision after this along with the risks and benefits that include bleeding, infection, damage to surrounding structures and need for additional procedures.  All of her questions were answered we will plan to get the ultrasound and proceed from there.  Cindra Presume 12/24/2020, 12:53 PM

## 2020-12-27 ENCOUNTER — Other Ambulatory Visit: Payer: Self-pay | Admitting: Emergency Medicine

## 2020-12-27 DIAGNOSIS — K2100 Gastro-esophageal reflux disease with esophagitis, without bleeding: Secondary | ICD-10-CM

## 2020-12-29 ENCOUNTER — Ambulatory Visit (HOSPITAL_COMMUNITY)
Admission: RE | Admit: 2020-12-29 | Discharge: 2020-12-29 | Disposition: A | Discharge status: Home / Self Care | Payer: 59 | Source: Ambulatory Visit | Attending: Internal Medicine | Admitting: Internal Medicine

## 2020-12-29 ENCOUNTER — Other Ambulatory Visit: Payer: Self-pay

## 2020-12-29 DIAGNOSIS — R1013 Epigastric pain: Secondary | ICD-10-CM | POA: Diagnosis present

## 2020-12-29 DIAGNOSIS — R140 Abdominal distension (gaseous): Secondary | ICD-10-CM | POA: Diagnosis present

## 2020-12-30 ENCOUNTER — Other Ambulatory Visit: Payer: Self-pay

## 2020-12-30 DIAGNOSIS — N2889 Other specified disorders of kidney and ureter: Secondary | ICD-10-CM

## 2021-01-01 ENCOUNTER — Telehealth: Payer: Self-pay | Admitting: Internal Medicine

## 2021-01-01 NOTE — Telephone Encounter (Signed)
Pt notified via mychart, order in epic and rad scheduling to contact pt regarding appt.

## 2021-01-01 NOTE — Telephone Encounter (Signed)
Inbound call from patient returning nurse's call in regards to ultrasound results.

## 2021-01-07 ENCOUNTER — Ambulatory Visit (HOSPITAL_COMMUNITY): Payer: 59

## 2021-01-07 LAB — HM DIABETES EYE EXAM

## 2021-01-14 ENCOUNTER — Encounter: Payer: Self-pay | Admitting: Emergency Medicine

## 2021-01-15 ENCOUNTER — Ambulatory Visit (HOSPITAL_COMMUNITY)
Admission: RE | Admit: 2021-01-15 | Discharge: 2021-01-15 | Disposition: A | Discharge status: Home / Self Care | Payer: 59 | Source: Ambulatory Visit | Attending: Internal Medicine | Admitting: Internal Medicine

## 2021-01-15 ENCOUNTER — Other Ambulatory Visit: Payer: Self-pay

## 2021-01-15 ENCOUNTER — Other Ambulatory Visit: Payer: Self-pay | Admitting: Internal Medicine

## 2021-01-15 DIAGNOSIS — N2889 Other specified disorders of kidney and ureter: Secondary | ICD-10-CM | POA: Insufficient documentation

## 2021-01-15 LAB — POCT I-STAT CREATININE: Creatinine, Ser: 0.6 mg/dL (ref 0.44–1.00)

## 2021-01-15 MED ORDER — IOHEXOL 350 MG/ML SOLN
100.0000 mL | Freq: Once | INTRAVENOUS | Status: AC | PRN
  Administered 2021-01-15: 100 mL via INTRAVENOUS

## 2021-02-26 ENCOUNTER — Ambulatory Visit: Payer: 59 | Admitting: Emergency Medicine

## 2021-03-02 ENCOUNTER — Ambulatory Visit: Payer: 59 | Admitting: Emergency Medicine

## 2021-03-10 ENCOUNTER — Encounter: Payer: Self-pay | Admitting: Internal Medicine

## 2021-03-10 ENCOUNTER — Ambulatory Visit (AMBULATORY_SURGERY_CENTER): Payer: 59 | Admitting: Internal Medicine

## 2021-03-10 ENCOUNTER — Other Ambulatory Visit: Payer: Self-pay

## 2021-03-10 VITALS — BP 101/58 | HR 88 | Temp 97.8°F | Resp 20 | Ht 60.0 in | Wt 174.0 lb

## 2021-03-10 DIAGNOSIS — K5730 Diverticulosis of large intestine without perforation or abscess without bleeding: Secondary | ICD-10-CM | POA: Diagnosis not present

## 2021-03-10 DIAGNOSIS — R1084 Generalized abdominal pain: Secondary | ICD-10-CM

## 2021-03-10 DIAGNOSIS — R1010 Upper abdominal pain, unspecified: Secondary | ICD-10-CM

## 2021-03-10 DIAGNOSIS — K219 Gastro-esophageal reflux disease without esophagitis: Secondary | ICD-10-CM

## 2021-03-10 DIAGNOSIS — Z86010 Personal history of colonic polyps: Secondary | ICD-10-CM

## 2021-03-10 DIAGNOSIS — K648 Other hemorrhoids: Secondary | ICD-10-CM

## 2021-03-10 MED ORDER — DEXTROSE 5 % IV SOLN: INTRAVENOUS | Status: DC

## 2021-03-10 MED ORDER — SODIUM CHLORIDE 0.9 % IV SOLN: 500.0000 mL | Freq: Once | INTRAVENOUS | Status: DC

## 2021-03-10 NOTE — Progress Notes (Signed)
HISTORY OF PRESENT ILLNESS:   [name] is a 55 y.o. female, disabled secondary to chronic back pain for which she is on narcotics, who was last seen in this office November 18, 2019 regarding family history of colon cancer, personal history of adenomatous colon polyps due for surveillance, functional constipation, chronic GERD, epigastric pain postcholecystectomy, obesity, and mild elevation of ALT felt likely secondary to fatty liver.  See that dictation.  She was scheduled for colonoscopy and upper endoscopy.  However, she canceled her case is due to COVID infection.  She presents now with similar GI complaints and interested in following through with her procedures as previously recommended.  She is accompanied by a professional interpreter.  She tells me that she had continues with epigastric pain.  Her PCP diagnosed GERD and placed her on pantoprazole.  This is helped.  She still has some epigastric fullness and bloating.  No dysphagia.  Her last upper endoscopy was performed in 2012 to evaluate Hemoccult-positive stool, GERD, and vague dysphagia.  The exam was normal.  She also complains of ongoing constipation.  She takes 2 doses of MiraLAX daily.  She has a bowel movement about once every third day.  No bleeding.  Her family history is significant for a half sibling with colon cancer in their 18s.  The patient had a negative index exam in 2010.  In 2012 she underwent repeat colonoscopy for heme positive stool.  The terminal ileum was normal.  Mild sigmoid diverticulosis.  Small tubular adenoma removed.  Follow-up in 5 years recommended.  She has not followed up since.  Review of outside blood work from August 06, 2020 shows mild elevation of ALT at 47.  Otherwise normal liver test.  Otherwise normal comprehensive metabolic panel.  Normal CBC with hemoglobin 14.0.  CT scan from 2009 was unremarkable.  Patient's GI review of systems is otherwise negative   REVIEW OF SYSTEMS:   All non-GI  ROS negative unless otherwise stated in the HPI except for anxiety, arthritis, back pain, visual change, depression, fatigue, sleeping problems       Past Medical History:  Diagnosis Date   Acid reflux     Adenomatous colon polyp      2012   Anxiety     Constipation, chronic     Degenerative disc disease, cervical      s/p epidural injections   Depression     Fatty liver disease, nonalcoholic      per ultrasound   Fibromyalgia     Hypercholesterolemia     NSVD (normal spontaneous vaginal delivery)      X2   Prediabetes      a1c 6.1   Seizures ([place])      with dental procedure   Vitamin D deficiency             Past Surgical History:  Procedure Laterality Date   BACK SURGERY   2008   BREAST SURGERY        REDUCTIVE MAMMOPLASTY   herniated disc       LAPAROSCOPIC CHOLECYSTECTOMY       LUMBAR DISC SURGERY        L-4 AND L-5    PELVIC LAPAROSCOPY        BTSP   TONSILLECTOMY AND ADENOIDECTOMY       VAGINAL HYSTERECTOMY        Post Repair/TVT Sling      Social History [name]  reports that she has never smoked. She  has never used smokeless tobacco. She reports that she does not drink alcohol and does not use drugs.   family history includes Cancer in her sister; Colon cancer in her sister; Hypertension in her sister.        Allergies  Allergen Reactions   Latex Swelling   Other        LATEX, kiwi, papaya, fish   Macrodantin [Nitrofurantoin] Rash and Other (See Comments)      Rash, pain behind eyes          PHYSICAL EXAMINATION: Vital signs: BP 128/80   Pulse 86   Ht 5' (1.524 m)   Wt 174 lb 12.8 oz (79.3 kg)   SpO2 99%   BMI 34.14 kg/m   Constitutional: generally well-appearing, no acute distress but moves gingerly due to back pain Psychiatric: alert and oriented x3, cooperative Eyes: extraocular movements intact, anicteric, conjunctiva pink Mouth: oral pharynx moist, no lesions Neck: supple no lymphadenopathy Cardiovascular: heart  regular rate and rhythm, no murmur Lungs: clear to auscultation bilaterally Abdomen: soft, obese, nontender, nondistended, no obvious ascites, no peritoneal signs, normal bowel sounds, no organomegaly Rectal: Deferred until colonoscopy Extremities: no clubbing, cyanosis, or lower extremity edema bilaterally Skin: no lesions on visible extremities Neuro: No focal deficits.  Cranial nerves intact   ASSESSMENT:   1.  GERD.  Improvement in dyspeptic symptoms on PPI. 2.  Vague epigastric discomfort and bloating. 3.  Status post cholecystectomy 4.  Functional constipation.  Exacerbated by narcotics 5.  History of adenomatous colon polyp and family history of colon cancer.  Overdue for surveillance.  Last examination 2012     PLAN:   1.  Reflux precautions with attention to weight loss 2.  Continue PPI daily 3.  Schedule abdominal ultrasound to evaluate epigastric pain postcholecystectomy.   4.  Schedule upper endoscopy to evaluate epigastric pain.The nature of the procedure, as well as the risks, benefits, and alternatives were carefully and thoroughly reviewed with the patient. Ample time for discussion and questions allowed. The patient understood, was satisfied, and agreed to proceed.  5.  Schedule surveillance colonoscopy.The nature of the procedure, as well as the risks, benefits, and alternatives were carefully and thoroughly reviewed with the patient. Ample time for discussion and questions allowed. The patient understood, was satisfied, and agreed to proceed.  6.  Advised to take additional dosages of MiraLAX.  Titrate to achieve desired result.  GI INTERVAL NOTE  The patient was fully evaluated in the office December 23, 2020.  Please see complete history and physical examination as above.  I have been no significant interval clinical changes.  She is now for her colonoscopy and upper endoscopy.  Docia Chuck. Geri Seminole., M.D. [facility] Division of Gastroenterology

## 2021-03-10 NOTE — Op Note (Signed)
[place] Patient Name: [name] Procedure Date: 03/10/2021 3:22 PM MRN: [id number] Endoscopist: Docia Chuck. Henrene Pastor , MD Age: 55 Referring MD:  Date of Birth: [birth year] Gender: Female Account #: [id number] Procedure:                Colonoscopy Indications:              High risk colon cancer surveillance: Personal                            history of non-advanced adenoma. Previous                            examinations 2010, 2012.. Other complaints include                            constipation and abdominal discomfort. Medicines:                Monitored Anesthesia Care Procedure:                Pre-Anesthesia Assessment:                           - Prior to the procedure, a History and Physical                            was performed, and patient medications and                            allergies were reviewed. The patient's tolerance of                            previous anesthesia was also reviewed. The risks                            and benefits of the procedure and the sedation                            options and risks were discussed with the patient.                            All questions were answered, and informed consent                            was obtained. Prior Anticoagulants: The patient has                            taken no previous anticoagulant or antiplatelet                            agents. ASA Grade Assessment: II - A patient with                            mild systemic disease. After reviewing the risks  and benefits, the patient was deemed in                            satisfactory condition to undergo the procedure.                           After obtaining informed consent, the colonoscope                            was passed under direct vision. Throughout the                            procedure, the patient's blood pressure, pulse, and                            oxygen saturations were  monitored continuously. The                            Olympus CF-HQ190L (Serial# [id number]) Colonoscope was                            introduced through the anus and advanced to the the                            cecum, identified by appendiceal orifice and                            ileocecal valve. The ileocecal valve, appendiceal                            orifice, and rectum were photographed. The quality                            of the bowel preparation was excellent. The                            colonoscopy was performed without difficulty. The                            patient tolerated the procedure well. The bowel                            preparation used was SUPREP via split dose                            instruction. Scope In: 3:36:51 PM Scope Out: 3:49:17 PM Scope Withdrawal Time: 0 hours 9 minutes 42 seconds  Total Procedure Duration: 0 hours 12 minutes 26 seconds  Findings:                 Multiple diverticula were found in the sigmoid                            colon.  Internal hemorrhoids were found during                            retroflexion. The hemorrhoids were small.                           The exam was otherwise without abnormality on                            direct and retroflexion views. Complications:            No immediate complications. Estimated blood loss:                            None. Estimated Blood Loss:     Estimated blood loss: none. Impression:               - Diverticulosis in the sigmoid colon.                           - Internal hemorrhoids.                           - The examination was otherwise normal on direct                            and retroflexion views.                           - No specimens collected. Recommendation:           - Repeat colonoscopy in 10 years for surveillance.                           - Patient has a contact number available for                            emergencies. The  signs and symptoms of potential                            delayed complications were discussed with the                            patient. Return to normal activities tomorrow.                            Written discharge instructions were provided to the                            patient.                           - Resume previous diet.                           - Continue present medications. Docia Chuck. Henrene Pastor, MD 03/10/2021 3:54:43 PM This report has been signed electronically.

## 2021-03-10 NOTE — Op Note (Signed)
[name] Procedure Date: 03/10/2021 3:21 PM MRN: [id number] Endoscopist: Docia Chuck. [name] , MD Age: 55 Referring MD:  Date of Birth: 06/01/1966 Gender: Female Account #: [id number] Procedure:                Upper GI endoscopy Indications:              Abdominal pain, Esophageal reflux Medicines:                Monitored Anesthesia Care Procedure:                Pre-Anesthesia Assessment:                           - Prior to the procedure, a History and Physical                            was performed, and patient medications and                            allergies were reviewed. The patient's tolerance of                            previous anesthesia was also reviewed. The risks                            and benefits of the procedure and the sedation                            options and risks were discussed with the patient.                            All questions were answered, and informed consent                            was obtained. Prior Anticoagulants: The patient has                            taken no previous anticoagulant or antiplatelet                            agents. ASA Grade Assessment: II - A patient with                            mild systemic disease. After reviewing the risks                            and benefits, the patient was deemed in                            satisfactory condition to undergo the procedure.                           After obtaining informed consent, the endoscope was  passed under direct vision. Throughout the                            procedure, the patient's blood pressure, pulse, and                            oxygen saturations were monitored continuously. The                            GIF [id number] #[id number] was introduced through the                            mouth, and advanced to the second part of duodenum.                            The upper GI  endoscopy was accomplished without                            difficulty. The patient tolerated the procedure                            well. Scope In: Scope Out: Findings:                 The esophagus was normal.                           The stomach was normal.                           The examined duodenum was normal.                           The cardia and gastric fundus were normal on                            retroflexion. Complications:            No immediate complications. Estimated Blood Loss:     Estimated blood loss: none. Impression:               1. Normal EGD                           2. GERD. Recommendation:           1. Reflux precautions                           2. Weight loss                           3. Continue current medications                           4. Resume general medical care with your primary                            provider. Docia Chuck. [name] Pastor, MD 03/10/2021 4:03:56  PM This report has been signed electronically.

## 2021-03-10 NOTE — Progress Notes (Signed)
Sedate, gd SR, tolerated procedure well, VSS, report to RN 

## 2021-03-10 NOTE — Progress Notes (Signed)
Made Dr. Henrene Pastor aware that pt. Cbg=66 and her normal range is 118 and she felt nervous,verbal order received to hang D5W.CBG RECHECKED=107,Pt. Reported "feeling better".   Check-in-Linda  V/S-DT

## 2021-03-10 NOTE — Patient Instructions (Signed)
Please read handouts provided. Continue present medications. Reflux precautions. Repeat colonoscopy in 10 years.    USTED TUVO UN PROCEDIMIENTO ENDOSCPICO HOY EN [facility]:   Lea el informe del procedimiento que se le entreg para cualquier pregunta especfica sobre lo que se Primary school teacher.  Si el informe del examen no responde a sus preguntas, por favor llame a su gastroenterlogo para aclararlo.  Si usted solicit que no se le den [employer] de lo que se Estate manager/land agent en su procedimiento al [employer] va a cuidar, entonces el informe del procedimiento se ha incluido en un sobre sellado para que usted lo revise despus cuando le sea ms conveniente.   LO QUE PUEDE ESPERAR: Algunas sensaciones de hinchazn en el abdomen.  Puede tener ms gases de lo normal.  El caminar puede ayudarle a eliminar el aire que se le puso en el tracto gastrointestinal durante el procedimiento y reducir la hinchazn.  Si le hicieron una endoscopia inferior (como una colonoscopia o una sigmoidoscopia flexible), podra notar manchas de sangre en las heces fecales o en el papel higinico.  Si se someti a una preparacin intestinal para su procedimiento, es posible que no tenga una evacuacin intestinal normal durante [employer].   Tenga en cuenta:  Es posible que note un poco de irritacin y congestin en la nariz o algn drenaje.  Esto es debido al oxgeno [employer] durante su procedimiento.  No hay que preocuparse y esto debe desaparecer ms o Scientist, research (medical).   SNTOMAS PARA REPORTAR INMEDIATAMENTE:  Despus de una endoscopia inferior (colonoscopia o sigmoidoscopia flexible):  Cantidades excesivas de sangre en las heces fecales  Sensibilidad significativa o empeoramiento de los dolores abdominales   Hinchazn aguda del abdomen que antes no tena   Fiebre de 100F o ms   Despus de la endoscopia superior (EGD)  Vmitos de Biochemist, clinical o material como caf molido   Dolor en el pecho o  dolor debajo de los omplatos que antes no tena   Dolor o dificultad persistente para tragar  Falta de aire que antes no tena   Fiebre de 100F o ms  Heces fecales negras y pegajosas   Para asuntos urgentes o de Freight forwarder, puede comunicarse con un gastroenterlogo a cualquier hora llamando al [phone].  DIETA:  Recomendamos una comida pequea al principio, pero luego puede continuar con su dieta normal.  Tome muchos lquidos, Teacher, adult education las bebidas alcohlicas durante 24 horas.    ACTIVIDAD:  Debe planear tomarse las cosas con calma por el resto del da y no debe CONDUCIR ni usar maquinaria pesada Programmer, applications (debido a los medicamentos de sedacin utilizados durante el examen).     SEGUIMIENTO: Nuestro personal llamar al nmero que aparece en su historial al siguiente da hbil de su procedimiento para ver cmo se siente y para responder cualquier pregunta o inquietud que pueda tener con respecto a la informacin que se le dio despus del procedimiento. Si no podemos contactarle, le dejaremos un mensaje.  Sin embargo, si se siente bien y no tiene Paediatric nurse, no es necesario que nos devuelva la llamada.  Asumiremos que ha regresado a sus actividades diarias normales sin incidentes. Si se le tomaron algunas biopsias, le contactaremos por telfono o por carta en las prximas 3 semanas.  Si no ha sabido [employer] biopsias en el transcurso de 3 semanas, por favor llmenos al [phone].   FIRMAS/CONFIDENCIALIDAD: Usted y/o el acompaante que le cuide  han firmado documentos que se ingresarn en su historial mdico electrnico.  Estas firmas atestiguan el hecho de que la informacin anterior     YOU HAD AN ENDOSCOPIC PROCEDURE TODAY AT [facility]:   Refer to the procedure report that was given to you for any specific questions about what was found during the examination.  If the procedure report does not answer your questions, please call your  gastroenterologist to clarify.  If you requested that your care partner not be given the details of your procedure findings, then the procedure report has been included in a sealed envelope for you to review at your convenience later.  YOU SHOULD EXPECT: Some feelings of bloating in the abdomen. Passage of more gas than usual.  Walking can help get rid of the air that was put into your GI tract during the procedure and reduce the bloating. If you had a lower endoscopy (such as a colonoscopy or flexible sigmoidoscopy) you may notice spotting of blood in your stool or on the toilet paper. If you underwent a bowel prep for your procedure, you may not have a normal bowel movement for a few days.  Please Note:  You might notice some irritation and congestion in your nose or some drainage.  This is from the oxygen used during your procedure.  There is no need for concern and it should clear up in a day or so.  SYMPTOMS TO REPORT IMMEDIATELY:  Following lower endoscopy (colonoscopy or flexible sigmoidoscopy):  Excessive amounts of blood in the stool  Significant tenderness or worsening of abdominal pains  Swelling of the abdomen that is new, acute  Fever of 100F or higher  Following upper endoscopy (EGD)  Vomiting of blood or coffee ground material  New chest pain or pain under the shoulder blades  Painful or persistently difficult swallowing  New shortness of breath  Fever of 100F or higher  Black, tarry-looking stools  For urgent or emergent issues, a gastroenterologist can be reached at any hour by calling [phone]. Do not use MyChart messaging for urgent concerns.    DIET:  We do recommend a small meal at first, but then you may proceed to your regular diet.  Drink plenty of fluids but you should avoid alcoholic beverages for 24 hours.  ACTIVITY:  You should plan to take it easy for the rest of today and you should NOT DRIVE or use heavy machinery until tomorrow (because of the  sedation medicines used during the test).    FOLLOW UP: Our staff will call the number listed on your records 48-72 hours following your procedure to check on you and address any questions or concerns that you may have regarding the information given to you following your procedure. If we do not reach you, we will leave a message.  We will attempt to reach you two times.  During this call, we will ask if you have developed any symptoms of COVID 19. If you develop any symptoms (ie: fever, flu-like symptoms, shortness of breath, cough etc.) before then, please call [phone].  If you test positive for Covid 19 in the 2 weeks post procedure, please call and report this information to [place].    If any biopsies were taken you will be contacted by phone or by letter within the next 1-3 weeks.  Please call us at [phone] if you have not heard about the biopsies in 3 weeks.    SIGNATURES/CONFIDENTIALITY: You and/or your care partner  have signed paperwork which will be entered into your electronic medical record.  These signatures attest to the fact that that the information above on your After Visit Summary has been reviewed and is understood.  Full responsibility of the confidentiality of this discharge information lies with you and/or your care-partner.

## 2021-03-12 ENCOUNTER — Telehealth: Payer: Self-pay

## 2021-03-12 NOTE — Telephone Encounter (Signed)
Attempted f/u call back using spanish interpreter. Instructed to call with any questions or concerns.

## 2021-03-12 NOTE — Telephone Encounter (Signed)
I called [phone] x2 and the phone rang 2 x then hung up.  This is a follow up call from her EGD 03/10/21.  Unable to leave a message.

## 2021-03-16 ENCOUNTER — Ambulatory Visit (INDEPENDENT_AMBULATORY_CARE_PROVIDER_SITE_OTHER): Payer: 59 | Admitting: Emergency Medicine

## 2021-03-16 ENCOUNTER — Encounter: Payer: Self-pay | Admitting: Emergency Medicine

## 2021-03-16 ENCOUNTER — Other Ambulatory Visit: Payer: Self-pay

## 2021-03-16 VITALS — BP 116/62 | HR 88 | Temp 97.9°F | Ht 60.0 in | Wt 171.0 lb

## 2021-03-16 DIAGNOSIS — R7303 Prediabetes: Secondary | ICD-10-CM

## 2021-03-16 DIAGNOSIS — E785 Hyperlipidemia, unspecified: Secondary | ICD-10-CM

## 2021-03-16 DIAGNOSIS — R002 Palpitations: Secondary | ICD-10-CM

## 2021-03-16 DIAGNOSIS — K5790 Diverticulosis of intestine, part unspecified, without perforation or abscess without bleeding: Secondary | ICD-10-CM | POA: Insufficient documentation

## 2021-03-16 DIAGNOSIS — G894 Chronic pain syndrome: Secondary | ICD-10-CM

## 2021-03-16 DIAGNOSIS — K760 Fatty (change of) liver, not elsewhere classified: Secondary | ICD-10-CM

## 2021-03-16 DIAGNOSIS — I700 Atherosclerosis of aorta: Secondary | ICD-10-CM | POA: Diagnosis not present

## 2021-03-16 DIAGNOSIS — F32A Depression, unspecified: Secondary | ICD-10-CM

## 2021-03-16 LAB — POCT GLYCOSYLATED HEMOGLOBIN (HGB A1C): Hemoglobin A1C: 6.1 % — AB (ref 4.0–5.6)

## 2021-03-16 LAB — COMPREHENSIVE METABOLIC PANEL
ALT: 28 U/L (ref 0–35)
AST: 21 U/L (ref 0–37)
Albumin: 4.8 g/dL (ref 3.5–5.2)
Alkaline Phosphatase: 88 U/L (ref 39–117)
BUN: 11 mg/dL (ref 6–23)
CO2: 27 mEq/L (ref 19–32)
Calcium: 10 mg/dL (ref 8.4–10.5)
Chloride: 101 mEq/L (ref 96–112)
Creatinine, Ser: 0.64 mg/dL (ref 0.40–1.20)
GFR: 99.31 mL/min (ref >60.00)
Glucose, Bld: 101 mg/dL — ABNORMAL HIGH (ref 70–99)
Potassium: 4.9 mEq/L (ref 3.5–5.1)
Sodium: 137 mEq/L (ref 135–145)
Total Bilirubin: 0.6 mg/dL (ref 0.2–1.2)
Total Protein: 8.1 g/dL (ref 6.0–8.3)

## 2021-03-16 LAB — LIPID PANEL
Cholesterol: 182 mg/dL (ref 0–200)
HDL: 48.5 mg/dL (ref >39.00)
Total CHOL/HDL Ratio: 4
Triglycerides: 405 mg/dL — ABNORMAL HIGH (ref 0.0–149.0)

## 2021-03-16 LAB — LDL CHOLESTEROL, DIRECT: Direct LDL: 85 mg/dL

## 2021-03-16 LAB — TSH: TSH: 1.12 u[IU]/mL (ref 0.35–5.50)

## 2021-03-16 NOTE — Patient Instructions (Signed)
Plan de alimentacin para personas con prediabetes Prediabetes Eating Plan La prediabetes es una afeccin que hace que los niveles de azcar en la sangre (glucosa) sean ms altos de lo normal. Esto aumenta el riesgo de desarrollar diabetes tipo 2 (diabetes mellitus tipo 2). Trabajar con un profesional de la salud o especialista en nutricin (nutricionista) para Teacher, English as a foreign language en la dieta y el estilo de vida puede ayudar a prevenir el inicio de la diabetes. Estos cambios pueden ayudarlo a: [employer] de glucemia. Mejorar los niveles de [place]. Controlar la presin arterial. Consejos para seguir [employer] plan Al leer las etiquetas de los alimentos Lea las etiquetas de los alimentos envasados para controlar la cantidad de grasa, sal (sodio) y azcar que contienen. Evite los alimentos que contengan lo siguiente: Grasas saturadas. Grasas trans. Azcares agregados. Evite los alimentos que contengan ms de 300 miligramos (mg) de sodio por porcin. Limite el consumo de sodio a menos de 2300 mg por da. Al ir de compras Evite comprar alimentos procesados y preelaborados. Evite comprar bebidas con azcar agregada. Al cocinar Cocine con aceite de oliva. No use mantequilla, manteca de cerdo ni mantequilla clarificada. Cocine los alimentos al horno, a la parrilla, asados, al vapor o hervidos. Evite frerlos. Planificacin de las comidas  Trabaje con el nutricionista para crear un plan de alimentacin que sea adecuado para usted. Esto puede incluir el seguimiento de cuntas caloras ingiere al da. Use un registro de alimentos, un cuaderno o una aplicacin mvil para anotar lo que comi en cada comida. Considere la posibilidad de seguir Web designer. Esta puede comprender lo siguiente: Comer varias porciones de frutas y verduras frescas por Training and development officer. Pescado al [employer] veces por semana. Comer una porcin de cereales integrales, frijoles, frutos secos y semillas por da. Aceite de Technical brewer de otras grasas. Limitar el consumo de alcohol. Limitar la carne roja. Usar productos lcteos descremados o con bajo contenido de [place]. Considerar seguir Ardelia Mems dieta a base de vegetales. Esta incluye hacer elecciones alimentarias que se concentren en comer principalmente verduras y frutas, cereales, frijoles, frutos secos y semillas. Si tiene hipertensin arterial, quizs Development worker, international aid consumo de sodio o seguir una dieta como el plan de alimentacin basado en los Enfoques Alimentarios para Detener la Hipertensin (Dietary Approaches to Stop Hypertension, DASH). La dieta DASH tiene como objetivo bajar la hipertensin arterial. Kary Kos de vida Establezca metas para bajar de peso con la ayuda de su equipo de atencin mdica. A la [employer] con prediabetes se les recomienda bajar un 7 % de su peso corporal. Sherilyn Cooter al menos 30 minutos de ejercicio, 5 o ms das a la semana. Asista a un grupo de apoyo o solicite el apoyo de un consejero de salud mental. Use los medicamentos de venta libre y los recetados solamente como se lo haya indicado el mdico. Qu alimentos se recomiendan? Lambert Mody Bayas. Bananas. Manzanas. Naranjas. Uvas. Papaya. Mango. [place]. Kiwi. Pomelo. Cerezas. [place] Valeda Malm. Espinaca. Guisantes. Remolachas. Coliflor. Repollo. Brcoli. Zanahorias. Tomates. Cal[place]aza. Augustin Coupe. Hierbas. Pimientos. Cebollas. Pepinos. Coles de Bruselas. Granos Productos integrales, como panes, [place], cereales y pastas de salvado o integrales. Avena sin azcar. Trigo burgol. Cebada. Quinua. Arroz integral. Tacos o tortillas de harina de maz o de salvado. Carnes y Psychiatric nurse. Carne de ave sin piel. Cortes magros de cerdo y carne de res. Tofu. Huevos. Frutos secos. Frijoles. Lcteos Productos lcteos descremados o semidescremados, como yogur, queso cottage y [place]. [employer]. T. Caf. Gaseosas sin azcar  o dietticas. Soda. Leche descremada o con bajo contenido de  [place]. Productos alternativos a la [place], como [place] de soja o de [place]. Grasas y aceites Aceite de [place]. Aceite de canola. Aceite de girasol. Aceite de semillas de uva. [place]. Nueces. Dulces y postres Pudin sin azcar o con bajo contenido de [place]. Helado y otros postres congelados sin azcar o con bajo contenido de [place]. Alios y condimentos Hierbas. Especias sin sodio. Mostaza. Salsa de pepinillos. [place] con bajo contenido de sal y de Location manager. Salsa barbacoa con bajo contenido de sal y de azcar. Mayonesa con bajo contenido de grasa o sin grasa. Es posible que los productos mencionados arriba no formen una lista completa de las bebidas o los alimentos recomendados. Consulte a un nutricionista para obtener ms informacin. Qu alimentos no se recomiendan? Lambert Mody Frutas enlatadas al almbar. Verduras Verduras enlatadas. Verduras congeladas con mantequilla o salsa de crema. Granos Productos elaborados con [place] y [place], como panes, pastas, bocadillos y cereales. Carnes y otras protenas Cortes de carne con alto contenido de Lobbyist. Carne de ave con piel. Carne empanizada o frita. Carnes procesadas. Lcteos Yogur, [place]ral[place] enteros. Bebidas Bebidas azucaradas, como t helado y [place]. Grasas y [employer]. [place]. Mantequilla clarificada. Dulces y [employer], como pasteles, pastelitos, galletas dulces y tarta de [place]. Alios y condimentos Mezclas de especias con sal agregada. [place]. Salsa barbacoa. Mayonesa. Es posible que los productos que se enumeran ms arriba no sean una lista completa de los alimentos y las bebidas que no se recomiendan. Consulte a un nutricionista para obtener ms informacin. Dnde buscar ms informacin American Diabetes Association (Asociacin Estadounidense de la Diabetes): www.diabetes.org Resumen Es posible que deba hacer cambios en la dieta y el estilo de vida para ayudar a prevenir el  inicio de la diabetes. Estos cambios pueden ayudarlo a Forensic scientist, mejorar los niveles de colesterol y Aeronautical engineer presin arterial. Establezca metas para bajar de peso con la ayuda de su equipo de atencin mdica. A la [employer] con prediabetes se les recomienda bajar un 7 % de su peso corporal. Considere la posibilidad de seguir Web designer. Esto incluye comer muchas frutas y verduras frescas, cereales integrales, frijoles, frutos secos, semillas, pescado y productos lcteos con bajo contenido de [place], y usar aceite de Tour manager de otras grasas. Esta informacin no tiene Marine scientist el consejo del mdico. Asegrese de hacerle al mdico cualquier pregunta que tenga. Document Revised: 11/27/2019 Document Reviewed: 11/27/2019 Elsevier Patient Education  2022 [employer].

## 2021-03-16 NOTE — Assessment & Plan Note (Signed)
Chronic.  Diet and nutrition discussed.  Continue atorvastatin 40 mg daily.

## 2021-03-16 NOTE — Assessment & Plan Note (Signed)
Recent colonoscopy report reviewed with patient. Continue daily fiber supplements. Diagnosis of diverticulosis discussed.

## 2021-03-16 NOTE — Assessment & Plan Note (Signed)
Well-controlled with hemoglobin A1c of 6.2.  Continue metformin 500 mg twice a day. Diet and nutrition discussed.  Advised to decrease amount of daily carbohydrate intake. Follow-up in 6 months.

## 2021-03-16 NOTE — Assessment & Plan Note (Signed)
Well-controlled.  Continue present medication.  Continue following up with pain management specialist.

## 2021-03-16 NOTE — Assessment & Plan Note (Signed)
Intermittent symptomatic episodes with near syncope.  Normal EKG. Needs cardiology evaluation.  Will need long-term monitor.

## 2021-03-16 NOTE — Assessment & Plan Note (Signed)
Diet and nutrition discussed.  Continue atorvastatin 40 mg daily. The 10-year ASCVD risk score (Arnett DK, et al., 2019) is: 1.5%   Values used to calculate the score:     Age: 55 years     Sex: Female     Is Non-Hispanic African American: No     Diabetic: No     Tobacco smoker: No     Systolic Blood Pressure: 275 mmHg     Is BP treated: No     HDL Cholesterol: 59 mg/dL     Total Cholesterol: 199 mg/dL

## 2021-03-16 NOTE — Assessment & Plan Note (Signed)
Diet and nutrition discussed. Continue atorvastatin 40 mg daily. 

## 2021-03-16 NOTE — Assessment & Plan Note (Signed)
Needs evaluation by psychiatrist.  Refer to behavioral health.

## 2021-03-16 NOTE — Progress Notes (Signed)
[name] 55 y.o.   Chief Complaint  Patient presents with   Diabetes    6 month f/u    HISTORY OF PRESENT ILLNESS: This is a 55 y.o. female with history of prediabetes here for follow-up. Has the following chronic medical problems: 1.  Chronic pain syndrome on long-term opiate therapy, oxycodone daily.  Sees pain management doctor on a regular basis 2.  Chronic depression.  Needs referral to behavioral health 3.  Prediabetes on metformin 500 mg twice a day 4.  History of GERD.  Normal recent upper and lower endoscopies Lower endoscopy also showed diverticulosis and internal hemorrhoids On PPI treatment with pantoprazole 40 mg daily. 5.  Atherosclerosis of aorta and dyslipidemia.  On atorvastatin 40 mg daily. 6.  Chronic rashes.  History of acanthosis nigracans.  Recent skin biopsies showed morphea as well. 7.  Occasional palpitations with near syncope. Lab Results  Component Value Date   HGBA1C 6.2 (H) 08/06/2020     HPI   Prior to Admission medications   Medication Sig Start Date End Date Taking? Authorizing Provider  atorvastatin (LIPITOR) 20 MG tablet TAKE 1 TABLET(20 MG) BY MOUTH DAILY 02/28/20  Yes Jacelyn Pi, Lilia Argue, MD  Calcium Carbonate-Vitamin D (CALTRATE 600+D PO) Take by mouth.   Yes [provider]  cholecalciferol (VITAMIN D3) 25 MCG (1000 UNIT) tablet Take 3 tablets (3,000 Units total) by mouth daily. 02/28/20  Yes Jacelyn Pi, Lilia Argue, MD  metFORMIN (GLUCOPHAGE) 500 MG tablet Take by mouth 2 (two) times daily with a meal.   Yes [provider]  Misc. Devices (BATH/SHOWER SEAT) MISC Use shower chair/seat while bathing 05/02/18  Yes Jacelyn Pi, Lilia Argue, MD  naloxone [facility]) nasal spray 4 mg/0.1 mL Place into the nose. 07/29/20  Yes [provider]  oxyCODONE (OXY IR/ROXICODONE) 5 MG immediate release tablet Take 1 tablet (5 mg total) by mouth every 6 (six) hours as needed for severe pain. 02/08/18  Yes Jacelyn Pi,  Lilia Argue, MD  pantoprazole (PROTONIX) 40 MG tablet TAKE 1 TABLET(40 MG) BY MOUTH DAILY 12/29/20  Yes Zania Kalisz, Ines Bloomer, MD  pregabalin (LYRICA) 50 MG capsule Take 50 mg by mouth 2 (two) times daily.   Yes [provider]  metFORMIN (GLUCOPHAGE) 500 MG tablet Take 1 tablet (500 mg total) by mouth 2 (two) times daily with a meal. 11/03/20 02/01/21  Horald Pollen, MD    Allergies  Allergen Reactions   Latex Swelling   Other     LATEX, kiwi, papaya, fish   Macrodantin [Nitrofurantoin] Rash and Other (See Comments)    Rash, pain behind eyes    Patient Active Problem List   Diagnosis Date Noted   Atherosclerosis of aorta ([place]) 03/16/2021   Abnormal cortisol level 10/01/2020   History of Cushing disease 08/27/2020   Gastroesophageal reflux disease with esophagitis without hemorrhage 08/27/2020   Chronic pain syndrome 08/27/2020   History of fatty infiltration of liver 08/27/2020   Hepatic steatosis 01/16/2019   Cushingoid facies 10/17/2018   Acanthosis nigricans 10/17/2018   Hypertriglyceridemia 10/17/2018   Adenomatous colon polyp    Degenerative disc disease, cervical    Fatty liver disease, nonalcoholic    Prediabetes    Vitamin D deficiency    Lumbosacral radiculopathy 06/04/2012   Pain, chronic due to trauma 06/04/2012   History of colonic polyps 09/29/2011   Fibromyalgia    Hypercholesterolemia    DYSPEPSIA, CHRONIC 11/06/2008   ALLERGIC RHINITIS 10/22/2008   Dyslipidemia 09/15/2007  Past Medical History:  Diagnosis Date   Acid reflux    Adenomatous colon polyp    2012   Allergy    Anxiety    Constipation, chronic    Degenerative disc disease, cervical    s/p epidural injections   Depression    Fatty liver disease, nonalcoholic    per ultrasound   Fibromyalgia    Hypercholesterolemia    NSVD (normal spontaneous vaginal delivery)    X2   Prediabetes    a1c 6.1   Seizures (HCC)    with dental procedure   Vitamin D deficiency     Past  Surgical History:  Procedure Laterality Date   BACK SURGERY  2008   BREAST SURGERY     REDUCTIVE MAMMOPLASTY   herniated disc     LAPAROSCOPIC CHOLECYSTECTOMY     LUMBAR DISC SURGERY     L-4 AND L-5    PELVIC LAPAROSCOPY     BTSP   TONSILLECTOMY AND ADENOIDECTOMY     VAGINAL HYSTERECTOMY     Post Repair/TVT Sling    Social History   Socioeconomic History   Marital status: Married    Spouse name: Not on file   Number of children: 2   Years of education: Not on file   Highest education level: Not on file  Occupational History    Employer: [employer]  Tobacco Use   Smoking status: Never   Smokeless tobacco: Never  Vaping Use   Vaping Use: Never used  Substance and Sexual Activity   Alcohol use: No   Drug use: No   Sexual activity: Not Currently    Partners: Male    Comment: 1st intercourse- 38, partners- 2, married- 61 yrs   Other Topics Concern   Not on file  Social History Narrative   Not on file   Social Determinants of Health   Financial Resource Strain: Not on file  Food Insecurity: Not on file  Transportation Needs: Not on file  Physical Activity: Not on file  Stress: Not on file  Social Connections: Not on file  Intimate Partner Violence: Not on file    Family History  Problem Relation Age of Onset   Hypertension Sister    Cancer Sister        colon ca... stepsister   Colon cancer Sister    Esophageal cancer Neg Hx    Stomach cancer Neg Hx    Rectal cancer Neg Hx      Review of Systems  Constitutional: Negative.  Negative for chills and fever.  HENT: Negative.  Negative for congestion and sore throat.   Respiratory: Negative.  Negative for cough and shortness of breath.   Cardiovascular:  Positive for palpitations. Negative for chest pain.  Gastrointestinal:  Negative for abdominal pain, diarrhea, nausea and vomiting.  Genitourinary: Negative.   Musculoskeletal:  Positive for back pain.  Skin:  Positive for rash.  Neurological:   Positive for dizziness. Negative for headaches.  All other systems reviewed and are negative.  Vitals:   03/16/21 0929  BP: 116/62  Pulse: 88  Temp: 97.9 F (36.6 C)  SpO2: 97%   Wt Readings from Last 3 Encounters:  03/16/21 171 lb (77.6 kg)  03/10/21 174 lb (78.9 kg)  12/24/20 176 lb (79.8 kg)    Physical Exam Vitals reviewed.  Constitutional:      Appearance: Normal appearance.  HENT:     Head: Normocephalic.  Eyes:     Extraocular Movements: Extraocular movements intact.  Pupils: Pupils are equal, round, and reactive to light.  Cardiovascular:     Rate and Rhythm: Normal rate and regular rhythm.     Pulses: Normal pulses.     Heart sounds: Normal heart sounds.  Pulmonary:     Effort: Pulmonary effort is normal.     Breath sounds: Normal breath sounds.  Musculoskeletal:     Cervical back: Normal range of motion and neck supple.  Skin:    General: Skin is warm and dry.     Capillary Refill: Capillary refill takes less than 2 seconds.  Neurological:     General: No focal deficit present.     Mental Status: She is alert and oriented to person, place, and time.  Psychiatric:        Mood and Affect: Mood normal.        Behavior: Behavior normal.    EKG: Normal sinus rhythm with a ventricular rate of 80 bpm.  No acute ischemic changes.  ASSESSMENT & PLAN: Problem List Items Addressed This Visit       Cardiovascular and Mediastinum   Atherosclerosis of aorta (HCC)    Diet and nutrition discussed.  Continue atorvastatin 40 mg daily.        Digestive   Fatty liver disease, nonalcoholic    Chronic.  Diet and nutrition discussed.  Continue atorvastatin 40 mg daily.      Hepatic steatosis   Relevant Orders   Comprehensive metabolic panel   Diverticulosis    Recent colonoscopy report reviewed with patient. Continue daily fiber supplements. Diagnosis of diverticulosis discussed.        Other   Dyslipidemia    Diet and nutrition discussed.  Continue  atorvastatin 40 mg daily. The 10-year ASCVD risk score (Arnett DK, et al., 2019) is: 1.5%   Values used to calculate the score:     Age: 20 years     Sex: Female     Is Non-Hispanic African American: No     Diabetic: No     Tobacco smoker: No     Systolic Blood Pressure: 010 mmHg     Is BP treated: No     HDL Cholesterol: 59 mg/dL     Total Cholesterol: 199 mg/dL       Relevant Orders   Comprehensive metabolic panel   Lipid panel   Palpitations    Intermittent symptomatic episodes with near syncope.  Normal EKG. Needs cardiology evaluation.  Will need long-term monitor.      Relevant Orders   Ambulatory referral to Cardiology   TSH   Prediabetes - Primary    Well-controlled with hemoglobin A1c of 6.2.  Continue metformin 500 mg twice a day. Diet and nutrition discussed.  Advised to decrease amount of daily carbohydrate intake. Follow-up in 6 months.      Relevant Orders   POCT glycosylated hemoglobin (Hb A1C) (Completed)   Comprehensive metabolic panel   Chronic pain syndrome    Well-controlled.  Continue present medication.  Continue following up with pain management specialist.      Chronic depression    Needs evaluation by psychiatrist.  Refer to behavioral health.      Relevant Orders   Ambulatory referral to Psychiatry   Patient Instructions  Plan de alimentacin para personas con prediabetes Prediabetes Eating Plan La prediabetes es una afeccin que hace que los niveles de azcar en la sangre (glucosa) sean ms altos de lo normal. Esto aumenta el riesgo de desarrollar diabetes tipo 2 (diabetes mellitus  tipo 2). Trabajar con un profesional de la salud o especialista en nutricin (nutricionista) para Teacher, English as a foreign language en la dieta y el estilo de vida puede ayudar a prevenir el inicio de la diabetes. Estos cambios pueden ayudarlo a: [employer] de glucemia. Mejorar los niveles de [place]. Controlar la presin arterial. Consejos para seguir [place] [employer] plan Al  leer las etiquetas de los alimentos Lea las etiquetas de los alimentos envasados para controlar la cantidad de grasa, sal (sodio) y azcar que contienen. Evite los alimentos que contengan lo siguiente: Grasas saturadas. Grasas trans. Azcares agregados. Evite los alimentos que contengan ms de 300 miligramos (mg) de sodio por porcin. Limite el consumo de sodio a menos de 2300 mg por da. Al ir de compras Evite comprar alimentos procesados y preelaborados. Evite comprar bebidas con azcar agregada. Al cocinar Cocine con aceite de oliva. No use mantequilla, manteca de cerdo ni mantequilla clarificada. Cocine los alimentos al horno, a la parrilla, asados, al vapor o hervidos. Evite frerlos. Planificacin de las comidas  Trabaje con el nutricionista para crear un plan de alimentacin que sea adecuado para usted. Esto puede incluir el seguimiento de cuntas caloras ingiere al da. Use un registro de alimentos, un cuaderno o una aplicacin mvil para anotar lo que comi en cada comida. Considere la posibilidad de seguir Web designer. Esta puede comprender lo siguiente: Comer varias porciones de frutas y verduras frescas por Training and development officer. Pescado al [employer] veces por semana. Comer una porcin de cereales integrales, frijoles, frutos secos y semillas por da. Aceite de Tour manager de otras grasas. Limitar el consumo de alcohol. Limitar la carne roja. Usar productos lcteos descremados o con bajo contenido de [place]. Considerar seguir Ardelia Mems dieta a base de vegetales. Esta incluye hacer elecciones alimentarias que se concentren en comer principalmente verduras y frutas, cereales, frijoles, frutos secos y semillas. Si tiene hipertensin arterial, quizs Development worker, international aid consumo de sodio o seguir una dieta como el plan de alimentacin basado en los Enfoques Alimentarios para Detener la Hipertensin (Dietary Approaches to Stop Hypertension, DASH). La dieta DASH tiene como objetivo bajar la  hipertensin arterial. Kary Kos de vida Establezca metas para bajar de peso con la ayuda de su equipo de atencin mdica. A la [employer] con prediabetes se les recomienda bajar un 7 % de su peso corporal. Sherilyn Cooter al menos 30 minutos de ejercicio, 5 o ms das a la semana. Asista a un grupo de apoyo o solicite el apoyo de un consejero de salud mental. Use los medicamentos de venta libre y los recetados solamente como se lo haya indicado el mdico. Qu alimentos se recomiendan? Lambert Mody Bayas. Bananas. Manzanas. Naranjas. Uvas. Papaya. Mango. Aniwa. Kiwi. Pomelo. Cerezas. [place] Valeda Malm. [place]spin[place]. Guisantes. Remolachas. Coliflor. Repollo. Brcoli. Zanahorias. Tomates. Cal[place]aza. Augustin Coupe. Hierbas. Pimientos. Cebollas. Pepinos. Coles de Bruselas. Granos Productos integrales, como panes, [place], cereales y pastas de salvado o integrales. Avena sin azcar. Trigo burgol. Cebada. Quinua. Arroz integral. Tacos o tortillas de harina de maz o de salvado. Carnes y Psychiatric nurse. Carne de ave sin piel. Cortes magros de cerdo y carne de res. Tofu. Huevos. Frutos secos. Frijoles. Lcteos Productos lcteos descremados o semidescremados, como yogur, queso cottage y [place]. [employer]. T. Caf. Gaseosas sin azcar o dietticas. Soda. Leche descremada o con bajo contenido de [place]. Productos alternativos a la [place], como [place] de soja o de [place]. Grasas y aceites Aceite de [place]. Aceite de canola. Aceite de girasol. Aceite de semillas de uva. Aguacate. Nueces.  Dulces y postres Pudin sin azcar o con bajo contenido de [place]. Helado y otros postres congelados sin azcar o con bajo contenido de [place]. Alios y condimentos Hierbas. Especias sin sodio. Mostaza. Salsa de pepinillos. [place] con bajo contenido de sal y de Location manager. Salsa barbacoa con bajo contenido de sal y de azcar. Mayonesa con bajo contenido de grasa o sin grasa. Es posible que los productos mencionados arriba no  formen una lista completa de las bebidas o los alimentos recomendados. Consulte a un nutricionista para obtener ms informacin. Qu alimentos no se recomiendan? Lambert Mody Frutas enlatadas al almbar. Verduras Verduras enlatadas. Verduras congeladas con mantequilla o salsa de crema. Granos Productos elaborados con [place] y [place], como panes, pastas, bocadillos y cereales. Carnes y otras protenas Cortes de carne con alto contenido de Lobbyist. Carne de ave con piel. Carne empanizada o frita. Carnes procesadas. Lcteos Yogur, [place] enteros. Bebidas Bebidas azucaradas, como t helado y [place]. Grasas y [employer]. [place]. Mantequilla clarificada. Dulces y [employer], como pasteles, pastelitos, galletas dulces y tarta de [place]. Alios y condimentos Mezclas de especias con sal agregada. [place]. Salsa barbacoa. Mayonesa. Es posible que los productos que se enumeran ms arriba no sean una lista completa de los alimentos y las bebidas que no se recomiendan. Consulte a un nutricionista para obtener ms informacin. Dnde buscar ms informacin American Diabetes Association (Asociacin Estadounidense de la Diabetes): www.diabetes.org Resumen Es posible que deba hacer cambios en la dieta y el estilo de vida para ayudar a prevenir el inicio de la diabetes. Estos cambios pueden ayudarlo a Forensic scientist, mejorar los niveles de colesterol y Aeronautical engineer presin arterial. Establezca metas para bajar de peso con la ayuda de su equipo de atencin mdica. A la [employer] con prediabetes se les recomienda bajar un 7 % de su peso corporal. Considere la posibilidad de seguir Web designer. Esto incluye comer muchas frutas y verduras frescas, cereales integrales, frijoles, frutos secos, semillas, pescado y productos lcteos con bajo contenido de [place], y usar aceite de Tour manager de otras grasas. Esta  informacin no tiene Marine scientist el consejo del mdico. Asegrese de hacerle al mdico cualquier pregunta que tenga. Document Revised: 11/27/2019 Document Reviewed: 11/27/2019 Elsevier Patient Education  2022 [place], MD [facility]

## 2021-03-23 ENCOUNTER — Encounter: Payer: Self-pay | Admitting: Emergency Medicine

## 2021-03-24 ENCOUNTER — Telehealth: Payer: Self-pay | Admitting: Emergency Medicine

## 2021-03-24 ENCOUNTER — Ambulatory Visit: Payer: 59 | Admitting: Cardiovascular Disease

## 2021-03-24 NOTE — Telephone Encounter (Signed)
Please send updated rx for atorvastatin 40mg  to pharmacy for patient  Pharmacy: [facility] [place], [place]  Phone:  [phone] Fax:  [phone]

## 2021-03-25 MED ORDER — ATORVASTATIN CALCIUM 20 MG PO TABS: ORAL_TABLET | ORAL | 3 refills | Status: DC

## 2021-03-25 NOTE — Telephone Encounter (Signed)
Refilled Atorvastatin 40 mg to [facility].

## 2021-04-07 ENCOUNTER — Telehealth: Payer: Self-pay | Admitting: Emergency Medicine

## 2021-04-07 NOTE — Telephone Encounter (Signed)
Addressed her cholesterol medication question.  Advised to take 40 mg of atorvastatin daily.  Patient has 20 mg tablets.  Advised to take 2 tablets at bedtime.

## 2021-04-15 DIAGNOSIS — Z0100 Encounter for examination of eyes and vision without abnormal findings: Secondary | ICD-10-CM | POA: Diagnosis not present

## 2021-05-01 DIAGNOSIS — M5416 Radiculopathy, lumbar region: Secondary | ICD-10-CM | POA: Diagnosis not present

## 2021-05-01 DIAGNOSIS — M4722 Other spondylosis with radiculopathy, cervical region: Secondary | ICD-10-CM | POA: Diagnosis not present

## 2021-05-01 DIAGNOSIS — M5459 Other low back pain: Secondary | ICD-10-CM | POA: Diagnosis not present

## 2021-05-02 ENCOUNTER — Other Ambulatory Visit: Payer: Self-pay | Admitting: Emergency Medicine

## 2021-05-02 DIAGNOSIS — K2100 Gastro-esophageal reflux disease with esophagitis, without bleeding: Secondary | ICD-10-CM

## 2021-05-04 ENCOUNTER — Ambulatory Visit: Payer: 59 | Admitting: Cardiovascular Disease

## 2021-05-11 DIAGNOSIS — M48061 Spinal stenosis, lumbar region without neurogenic claudication: Secondary | ICD-10-CM | POA: Diagnosis not present

## 2021-05-11 DIAGNOSIS — M5126 Other intervertebral disc displacement, lumbar region: Secondary | ICD-10-CM | POA: Diagnosis not present

## 2021-05-11 DIAGNOSIS — M799 Soft tissue disorder, unspecified: Secondary | ICD-10-CM | POA: Diagnosis not present

## 2021-05-11 DIAGNOSIS — Z981 Arthrodesis status: Secondary | ICD-10-CM | POA: Diagnosis not present

## 2021-06-02 DIAGNOSIS — M4722 Other spondylosis with radiculopathy, cervical region: Secondary | ICD-10-CM | POA: Diagnosis not present

## 2021-07-10 ENCOUNTER — Other Ambulatory Visit (HOSPITAL_COMMUNITY): Payer: Self-pay | Admitting: Plastic Surgery

## 2021-07-10 DIAGNOSIS — D170 Benign lipomatous neoplasm of skin and subcutaneous tissue of head, face and neck: Secondary | ICD-10-CM

## 2021-07-15 ENCOUNTER — Other Ambulatory Visit: Payer: Self-pay

## 2021-07-15 ENCOUNTER — Ambulatory Visit (HOSPITAL_BASED_OUTPATIENT_CLINIC_OR_DEPARTMENT_OTHER)
Admission: RE | Admit: 2021-07-15 | Discharge: 2021-07-15 | Disposition: A | Discharge status: Home / Self Care | Payer: Medicare HMO | Source: Ambulatory Visit | Attending: Plastic Surgery | Admitting: Plastic Surgery

## 2021-07-15 DIAGNOSIS — R220 Localized swelling, mass and lump, head: Secondary | ICD-10-CM | POA: Diagnosis not present

## 2021-07-15 DIAGNOSIS — D170 Benign lipomatous neoplasm of skin and subcutaneous tissue of head, face and neck: Secondary | ICD-10-CM | POA: Insufficient documentation

## 2021-07-27 DIAGNOSIS — Z1231 Encounter for screening mammogram for malignant neoplasm of breast: Secondary | ICD-10-CM | POA: Diagnosis not present

## 2021-07-27 LAB — HM MAMMOGRAPHY

## 2021-07-28 DIAGNOSIS — M5416 Radiculopathy, lumbar region: Secondary | ICD-10-CM | POA: Diagnosis not present

## 2021-07-28 DIAGNOSIS — R208 Other disturbances of skin sensation: Secondary | ICD-10-CM | POA: Diagnosis not present

## 2021-07-28 DIAGNOSIS — M797 Fibromyalgia: Secondary | ICD-10-CM | POA: Diagnosis not present

## 2021-07-28 DIAGNOSIS — T402X5A Adverse effect of other opioids, initial encounter: Secondary | ICD-10-CM | POA: Diagnosis not present

## 2021-07-29 ENCOUNTER — Other Ambulatory Visit: Payer: Self-pay

## 2021-07-29 ENCOUNTER — Ambulatory Visit (INDEPENDENT_AMBULATORY_CARE_PROVIDER_SITE_OTHER): Payer: Medicare HMO | Admitting: Licensed Clinical Social Worker

## 2021-07-29 DIAGNOSIS — F332 Major depressive disorder, recurrent severe without psychotic features: Secondary | ICD-10-CM | POA: Diagnosis not present

## 2021-07-29 DIAGNOSIS — F411 Generalized anxiety disorder: Secondary | ICD-10-CM
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
## 2021-07-29 NOTE — Plan of Care (Signed)
Pt agreeable to plan  ?

## 2021-08-05 ENCOUNTER — Other Ambulatory Visit: Payer: Self-pay | Admitting: Emergency Medicine

## 2021-08-10 DIAGNOSIS — G894 Chronic pain syndrome: Secondary | ICD-10-CM | POA: Diagnosis not present

## 2021-08-10 DIAGNOSIS — M5030 Other cervical disc degeneration, unspecified cervical region: Secondary | ICD-10-CM | POA: Diagnosis not present

## 2021-08-10 DIAGNOSIS — M5136 Other intervertebral disc degeneration, lumbar region: Secondary | ICD-10-CM | POA: Diagnosis not present

## 2021-08-13 DIAGNOSIS — L818 Other specified disorders of pigmentation: Secondary | ICD-10-CM | POA: Diagnosis not present

## 2021-08-17 DIAGNOSIS — L818 Other specified disorders of pigmentation: Secondary | ICD-10-CM | POA: Diagnosis not present

## 2021-08-18 ENCOUNTER — Encounter: Payer: Self-pay | Admitting: Emergency Medicine

## 2021-08-18 NOTE — Progress Notes (Signed)
Documented Mammogram results. ?

## 2021-08-19 ENCOUNTER — Encounter: Payer: Self-pay | Admitting: Plastic Surgery

## 2021-08-19 ENCOUNTER — Ambulatory Visit (INDEPENDENT_AMBULATORY_CARE_PROVIDER_SITE_OTHER): Payer: Medicare HMO | Admitting: Plastic Surgery

## 2021-08-19 ENCOUNTER — Other Ambulatory Visit (HOSPITAL_COMMUNITY)
Admission: RE | Admit: 2021-08-19 | Discharge: 2021-08-19 | Disposition: A | Discharge status: Home / Self Care | Payer: Medicare HMO | Source: Ambulatory Visit | Attending: Plastic Surgery | Admitting: Plastic Surgery

## 2021-08-19 ENCOUNTER — Other Ambulatory Visit: Payer: Self-pay

## 2021-08-19 VITALS — BP 115/73 | HR 85

## 2021-08-19 DIAGNOSIS — D170 Benign lipomatous neoplasm of skin and subcutaneous tissue of head, face and neck: Secondary | ICD-10-CM

## 2021-08-19 NOTE — Progress Notes (Signed)
Operative Note  ? ?DATE OF OPERATION: 08/19/2021 ? ?LOCATION:   ? ?SURGICAL DEPARTMENT: Plastic Surgery ? ?PREOPERATIVE DIAGNOSES: Right neck lipoma ? ?POSTOPERATIVE DIAGNOSES:  same ? ?PROCEDURE:  ?Excision of subcutaneous right neck lipoma measuring 4 cm ?Complex closure measuring 4 cm ? ?SURGEON: Talmadge Coventry, MD ? ?ANESTHESIA:  Local ? ?COMPLICATIONS: None.  ? ?INDICATIONS FOR PROCEDURE:  ?The patient, [name] is a 55 y.o. female born on 01-20-1966, is here for treatment of right neck lipoma ?MRN: [id number] ? ?CONSENT:  ?Informed consent was obtained directly from the patient. Risks, benefits and alternatives were fully discussed. Specific risks including but not limited to bleeding, infection, hematoma, seroma, scarring, pain, infection, wound healing problems, and need for further surgery were all discussed. The patient did have an ample opportunity to have questions answered to satisfaction.  ? ?DESCRIPTION OF PROCEDURE:  ?Local anesthesia was administered. The patient's operative site was prepped and draped in a sterile fashion. A time out was performed and all information was confirmed to be correct.  The lesion was excised with a 15 blade.  Hemostasis was obtained.  Circumferential undermining was performed and the skin was advanced and closed in layers with interrupted buried Monocryl sutures and 5-0 fast gut for the skin.  The lesion excised measured 4 cm, and the total length of closure measured 4 cm.   ? ?The patient tolerated the procedure well.  There were no complications. ?  ? ?

## 2021-08-21 ENCOUNTER — Telehealth: Payer: Self-pay

## 2021-08-21 NOTE — Telephone Encounter (Signed)
Patient called to say she had a procedure with [place] on Wednesday, excision of neck lipoma, and she would like to know whether or not she needs to keep the incision site covered.  Please call. ?

## 2021-08-24 LAB — SURGICAL PATHOLOGY

## 2021-08-24 NOTE — Telephone Encounter (Signed)
Spoke with patient and informed that as long as it is not giving her any trouble, she can leave it uncovered. Pt conveyed understanding.  ?

## 2021-08-25 DIAGNOSIS — E785 Hyperlipidemia, unspecified: Secondary | ICD-10-CM | POA: Diagnosis not present

## 2021-08-25 DIAGNOSIS — M5417 Radiculopathy, lumbosacral region: Secondary | ICD-10-CM | POA: Diagnosis not present

## 2021-08-25 DIAGNOSIS — F419 Anxiety disorder, unspecified: Secondary | ICD-10-CM | POA: Diagnosis not present

## 2021-08-25 DIAGNOSIS — M5136 Other intervertebral disc degeneration, lumbar region: Secondary | ICD-10-CM | POA: Diagnosis not present

## 2021-08-25 DIAGNOSIS — Z79899 Other long term (current) drug therapy: Secondary | ICD-10-CM | POA: Diagnosis not present

## 2021-08-28 NOTE — Progress Notes (Signed)
Patient is a 56 year old female with PMH of 4 cm neck lipoma s/p excision and complex closure performed 08/19/2021 Dr. Claudia Desanctis who presents to clinic for postprocedural follow-up. ? ?Reviewed procedure note and the wound was closed with interrupted buried Monocryl sutures and 5-0 fast gut for the skin. ? ?Today, patient states that she is doing very well.  No specific complaints.  Interpreter at bedside present for entirety of assessment and plan.  She tells me that she does have a history of hypertrophic scarring and keloiding from previous breast reduction surgery. ? ?Physical exam is entirely reassuring.  Excision site is well approximated.  No wounds or areas of dehiscence noted.  No surrounding cellulitic changes noted.  No significant hypertrophy at present.  No residual fast gut sutures. ? ?Discussed pathology, specimen was consistent with benign fatty lipoma. ? ?Discussed scar mitigation therapies such as steroid injections and/or silicone-based scar creams.  She would like to hold off on any steroid injections and instead proceed with the recommended Skinuva scar cream twice daily x3 months. ? ?No specific follow-up needed, but she can certainly call the clinic should she develop any questions or concerns.  Picture(s) obtained of the patient and placed in the chart were with the patient's or guardian's permission. ?

## 2021-09-01 DIAGNOSIS — R208 Other disturbances of skin sensation: Secondary | ICD-10-CM | POA: Diagnosis not present

## 2021-09-01 DIAGNOSIS — E119 Type 2 diabetes mellitus without complications: Secondary | ICD-10-CM | POA: Diagnosis not present

## 2021-09-01 DIAGNOSIS — G894 Chronic pain syndrome: Secondary | ICD-10-CM | POA: Diagnosis not present

## 2021-09-01 DIAGNOSIS — T402X5A Adverse effect of other opioids, initial encounter: Secondary | ICD-10-CM | POA: Diagnosis not present

## 2021-09-01 DIAGNOSIS — M797 Fibromyalgia: Secondary | ICD-10-CM | POA: Diagnosis not present

## 2021-09-02 ENCOUNTER — Ambulatory Visit: Payer: Medicare HMO | Admitting: Physician Assistant

## 2021-09-02 ENCOUNTER — Other Ambulatory Visit: Payer: Self-pay

## 2021-09-02 DIAGNOSIS — D170 Benign lipomatous neoplasm of skin and subcutaneous tissue of head, face and neck: Secondary | ICD-10-CM

## 2021-09-02 DIAGNOSIS — Z719 Counseling, unspecified: Secondary | ICD-10-CM

## 2021-09-15 ENCOUNTER — Ambulatory Visit: Payer: 59 | Admitting: Emergency Medicine

## 2021-09-16 ENCOUNTER — Ambulatory Visit (HOSPITAL_COMMUNITY): Payer: Medicare HMO | Admitting: Licensed Clinical Social Worker

## 2021-09-28 ENCOUNTER — Encounter: Payer: Self-pay | Admitting: Emergency Medicine

## 2021-09-28 ENCOUNTER — Ambulatory Visit (INDEPENDENT_AMBULATORY_CARE_PROVIDER_SITE_OTHER): Payer: Medicare HMO | Admitting: Emergency Medicine

## 2021-09-28 VITALS — BP 110/70 | HR 88 | Temp 98.1°F | Ht 60.0 in | Wt 171.2 lb

## 2021-09-28 DIAGNOSIS — K760 Fatty (change of) liver, not elsewhere classified: Secondary | ICD-10-CM

## 2021-09-28 DIAGNOSIS — E785 Hyperlipidemia, unspecified: Secondary | ICD-10-CM | POA: Diagnosis not present

## 2021-09-28 DIAGNOSIS — R7303 Prediabetes: Secondary | ICD-10-CM | POA: Diagnosis not present

## 2021-09-28 DIAGNOSIS — G894 Chronic pain syndrome: Secondary | ICD-10-CM | POA: Diagnosis not present

## 2021-09-28 LAB — COMPREHENSIVE METABOLIC PANEL
ALT: 23 U/L (ref 0–35)
AST: 19 U/L (ref 0–37)
Albumin: 4.8 g/dL (ref 3.5–5.2)
Alkaline Phosphatase: 76 U/L (ref 39–117)
BUN: 11 mg/dL (ref 6–23)
CO2: 29 mEq/L (ref 19–32)
Calcium: 10 mg/dL (ref 8.4–10.5)
Chloride: 101 mEq/L (ref 96–112)
Creatinine, Ser: 0.58 mg/dL (ref 0.40–1.20)
GFR: 101.31 mL/min (ref >60.00)
Glucose, Bld: 97 mg/dL (ref 70–99)
Potassium: 4.6 mEq/L (ref 3.5–5.1)
Sodium: 139 mEq/L (ref 135–145)
Total Bilirubin: 0.6 mg/dL (ref 0.2–1.2)
Total Protein: 8.2 g/dL (ref 6.0–8.3)

## 2021-09-28 LAB — CBC WITH DIFFERENTIAL/PLATELET
Basophils Absolute: 0 10*3/uL (ref 0.0–0.1)
Basophils Relative: 0.4 % (ref 0.0–3.0)
Eosinophils Absolute: 0.4 10*3/uL (ref 0.0–0.7)
Eosinophils Relative: 4.6 % (ref 0.0–5.0)
HCT: 38 % (ref 36.0–46.0)
Hemoglobin: 13 g/dL (ref 12.0–15.0)
Lymphocytes Relative: 26.9 % (ref 12.0–46.0)
Lymphs Abs: 2.4 10*3/uL (ref 0.7–4.0)
MCHC: 34.3 g/dL (ref 30.0–36.0)
MCV: 89.7 fl (ref 78.0–100.0)
Monocytes Absolute: 0.7 10*3/uL (ref 0.1–1.0)
Monocytes Relative: 7.6 % (ref 3.0–12.0)
Neutro Abs: 5.4 10*3/uL (ref 1.4–7.7)
Neutrophils Relative %: 60.5 % (ref 43.0–77.0)
Platelets: 341 10*3/uL (ref 150.0–400.0)
RBC: 4.23 Mil/uL (ref 3.87–5.11)
RDW: 13.4 % (ref 11.5–15.5)
WBC: 8.9 10*3/uL (ref 4.0–10.5)

## 2021-09-28 LAB — VITAMIN D 25 HYDROXY (VIT D DEFICIENCY, FRACTURES): VITD: 31.53 ng/mL (ref 30.00–100.00)

## 2021-09-28 LAB — HEMOGLOBIN A1C: Hgb A1c MFr Bld: 6.5 % (ref 4.6–6.5)

## 2021-09-28 MED ORDER — ATORVASTATIN CALCIUM 40 MG PO TABS: 40.0000 mg | ORAL_TABLET | Freq: Every day | ORAL | 3 refills | Status: DC

## 2021-09-28 NOTE — Assessment & Plan Note (Addendum)
Still active and affecting quality of life.  Pain management doctors handling chronic pain medication.  On chronic opiate medications. ?

## 2021-09-28 NOTE — Assessment & Plan Note (Signed)
Stable.  Advised to decrease amount of daily carbohydrate intake.  Hemoglobin A1c done today. ?

## 2021-09-28 NOTE — Progress Notes (Signed)
[name] ?56 y.o. ? ? ?Chief Complaint  ?Patient presents with  ? Follow-up  ?  Chronic pain issues   ? ? ?HISTORY OF PRESENT ILLNESS: ?This is a 56 y.o. female here for follow-up of chronic medical problems. ?Has history of chronic pain.  Sees chronic pain management doctor.  On chronic opiates. ?Last office visit 09/01/2021.  Office visit notes reviewed with patient. ?His third dyslipidemia and prediabetes. ?No other complaints or medical concerns today. ? ?HPI ? ? ?Prior to Admission medications   ?Medication Sig Start Date End Date Taking? Authorizing Provider  ?atorvastatin (LIPITOR) 40 MG tablet Take 1 tablet (40 mg total) by mouth daily. 09/28/21  Yes Shandy Vi, Ines Bloomer, MD  ?Calcium Carbonate-Vitamin D (CALTRATE 600+D PO) Take by mouth.   Yes [provider]  ?cholecalciferol (VITAMIN D3) 25 MCG (1000 UNIT) tablet Take 3 tablets (3,000 Units total) by mouth daily. 02/28/20  Yes Jacelyn Pi, Lilia Argue, MD  ?metFORMIN (GLUCOPHAGE) 500 MG tablet TAKE 1 TABLET(500 MG) BY MOUTH TWICE DAILY WITH A MEAL 08/05/21  Yes Horald Pollen, MD  ?Misc. Devices (BATH/SHOWER SEAT) MISC Use shower chair/seat while bathing 05/02/18  Yes Jacelyn Pi, Lilia Argue, MD  ?naloxone [facility]) nasal spray 4 mg/0.1 mL Place into the nose. 07/29/20  Yes [provider]  ?oxyCODONE-acetaminophen (PERCOCET) 7.5-325 MG tablet Take by mouth. 07/31/21 10/29/21 Yes [provider]  ?pantoprazole (PROTONIX) 40 MG tablet TAKE 1 TABLET(40 MG) BY MOUTH DAILY 05/02/21  Yes Zeb Rawl, Ines Bloomer, MD  ?pregabalin (LYRICA) 50 MG capsule Take 50 mg by mouth 2 (two) times daily.   Yes [provider]  ? ? ?Allergies  ?Allergen Reactions  ? Latex Swelling  ? Other   ?  LATEX, kiwi, papaya, fish  ? Macrodantin [Nitrofurantoin] Rash and Other (See Comments)  ?  Rash, pain behind eyes  ? ? ?Patient Active Problem List  ? Diagnosis Date Noted  ? GAD (generalized anxiety disorder) 07/29/2021  ? Severe episode of  recurrent major depressive disorder, without psychotic features ([place]) 07/29/2021  ? Atherosclerosis of aorta ([place]) 03/16/2021  ? Diverticulosis 03/16/2021  ? Chronic depression 03/16/2021  ? Abnormal cortisol level 10/01/2020  ? History of Cushing disease 08/27/2020  ? Gastroesophageal reflux disease with esophagitis without hemorrhage 08/27/2020  ? Chronic pain syndrome 08/27/2020  ? History of fatty infiltration of liver 08/27/2020  ? Hepatic steatosis 01/16/2019  ? Cushingoid facies 10/17/2018  ? Acanthosis nigricans 10/17/2018  ? Hypertriglyceridemia 10/17/2018  ? Adenomatous colon polyp   ? Degenerative disc disease, cervical   ? Fatty liver disease, nonalcoholic   ? Prediabetes   ? Vitamin D deficiency   ? Lumbosacral radiculopathy 06/04/2012  ? Pain, chronic due to trauma 06/04/2012  ? History of colonic polyps 09/29/2011  ? Fibromyalgia   ? Hypercholesterolemia   ? DYSPEPSIA, CHRONIC 11/06/2008  ? ALLERGIC RHINITIS 10/22/2008  ? Palpitations 02/23/2008  ? Dyslipidemia 09/15/2007  ? ? ?Past Medical History:  ?Diagnosis Date  ? Acid reflux   ? Adenomatous colon polyp   ? 2012  ? Allergy   ? Anxiety   ? Constipation, chronic   ? Degenerative disc disease, cervical   ? s/p epidural injections  ? Depression   ? Fatty liver disease, nonalcoholic   ? per ultrasound  ? Fibromyalgia   ? Hypercholesterolemia   ? NSVD (normal spontaneous vaginal delivery)   ? X2  ? Prediabetes   ? a1c 6.1  ? Seizures ([place])   ? with dental  procedure  ? Vitamin D deficiency   ? ? ?Past Surgical History:  ?Procedure Laterality Date  ? BACK SURGERY  2008  ? BREAST SURGERY    ? REDUCTIVE MAMMOPLASTY  ? herniated disc    ? LAPAROSCOPIC CHOLECYSTECTOMY    ? LUMBAR DISC SURGERY    ? L-4 AND L-5   ? PELVIC LAPAROSCOPY    ? BTSP  ? TONSILLECTOMY AND ADENOIDECTOMY    ? VAGINAL HYSTERECTOMY    ? Post Repair/TVT Sling  ? ? ?Social History  ? ?Socioeconomic History  ? Marital status: Married  ?  Spouse name: Not on file  ? Number of children: 2  ?  Years of education: Not on file  ? Highest education level: Not on file  ?Occupational History  ?  Employer: [employer]  ?Tobacco Use  ? Smoking status: Never  ? Smokeless tobacco: Never  ?Vaping Use  ? Vaping Use: Never used  ?Substance and Sexual Activity  ? Alcohol use: No  ? Drug use: No  ? Sexual activity: Not Currently  ?  Partners: Male  ?  Comment: 1st intercourse- 51, partners- 2, married- 78 yrs   ?Other Topics Concern  ? Not on file  ?Social History Narrative  ? Not on file  ? ?Social Determinants of Health  ? ?Financial Resource Strain: Not on file  ?Food Insecurity: Not on file  ?Transportation Needs: Not on file  ?Physical Activity: Not on file  ?Stress: Not on file  ?Social Connections: Not on file  ?Intimate Partner Violence: Not on file  ? ? ?Family History  ?Problem Relation Age of Onset  ? Hypertension Sister   ? Cancer Sister   ?     colon ca... stepsister  ? Colon cancer Sister   ? Esophageal cancer Neg Hx   ? Stomach cancer Neg Hx   ? Rectal cancer Neg Hx   ? ? ? ?Review of Systems  ?Constitutional: Negative.  Negative for chills and fever.  ?HENT: Negative.  Negative for congestion and sore throat.   ?Respiratory: Negative.  Negative for cough and shortness of breath.   ?Cardiovascular: Negative.  Negative for chest pain and palpitations.  ?Gastrointestinal:  Negative for abdominal pain, nausea and vomiting.  ?Genitourinary: Negative.   ?Musculoskeletal:  Positive for back pain, joint pain and neck pain.  ?Skin: Negative.  Negative for rash.  ?Neurological:  Negative for dizziness and headaches.  ?Psychiatric/Behavioral:  Negative for depression.   ?All other systems reviewed and are negative. ? ?Today's Vitals  ? 09/28/21 1037  ?BP: 110/70  ?Pulse: 88  ?Temp: 98.1 ?F (36.7 ?C)  ?SpO2: 97%  ?Weight: 171 lb 4 oz (77.7 kg)  ?Height: 5' (1.524 m)  ? ?Body mass index is 33.44 kg/m?. ? ?Physical Exam ?Vitals reviewed.  ?Constitutional:   ?   Appearance: Normal appearance.  ?HENT:  ?   Head:  Normocephalic.  ?Eyes:  ?   Extraocular Movements: Extraocular movements intact.  ?   Pupils: Pupils are equal, round, and reactive to light.  ?Cardiovascular:  ?   Rate and Rhythm: Normal rate and regular rhythm.  ?   Pulses: Normal pulses.  ?   Heart sounds: Normal heart sounds.  ?Pulmonary:  ?   Effort: Pulmonary effort is normal.  ?   Breath sounds: Normal breath sounds.  ?Musculoskeletal:  ?   Cervical back: No tenderness.  ?   Right lower leg: No edema.  ?   Left lower leg: No edema.  ?  Lymphadenopathy:  ?   Cervical: No cervical adenopathy.  ?Skin: ?   General: Skin is warm and dry.  ?Neurological:  ?   General: No focal deficit present.  ?   Mental Status: She is alert and oriented to person, place, and time.  ?Psychiatric:     ?   Mood and Affect: Mood normal.     ?   Behavior: Behavior normal.  ? ? ? ?ASSESSMENT & PLAN: ?Problem List Items Addressed This Visit   ? ?  ? Digestive  ? Fatty liver disease, nonalcoholic  ? Relevant Orders  ? CBC with Differential/Platelet  ? Hepatic steatosis  ?  Normal liver enzymes and on last month. ?Diet and nutrition discussed ?Continue atorvastatin 40 mg daily. ? ? ?  ?  ?  ? Other  ? Dyslipidemia - Primary  ?  Diet and nutrition discussed. ?The 10-year ASCVD risk score (Arnett DK, et al., 2019) is: 1.7% ?  Values used to calculate the score: ?    Age: 34 years ?    Sex: Female ?    Is Non-Hispanic African American: No ?    Diabetic: No ?    Tobacco smoker: No ?    Systolic Blood Pressure: 106 mmHg ?    Is BP treated: No ?    HDL Cholesterol: 48.5 mg/dL ?    Total Cholesterol: 182 mg/dL ?Continue atorvastatin 40 mg daily. ? ?  ?  ? Relevant Medications  ? atorvastatin (LIPITOR) 40 MG tablet  ? Other Relevant Orders  ? Comprehensive metabolic panel  ? VITAMIN D 25 Hydroxy (Vit-D Deficiency, Fractures)  ? Prediabetes  ?  Stable.  Advised to decrease amount of daily carbohydrate intake.  Hemoglobin A1c done today. ? ?  ?  ? Relevant Orders  ? VITAMIN D 25 Hydroxy (Vit-D  Deficiency, Fractures)  ? Hemoglobin A1c  ? Chronic pain syndrome  ?  Still active and affecting quality of life.  Pain management doctors handling chronic pain medication.  On chronic opiate medications. ?

## 2021-09-28 NOTE — Patient Instructions (Signed)

## 2021-09-28 NOTE — Assessment & Plan Note (Signed)
Normal liver enzymes and on last month. ?Diet and nutrition discussed ?Continue atorvastatin 40 mg daily. ? ?

## 2021-09-28 NOTE — Assessment & Plan Note (Signed)
Diet and nutrition discussed. ?The 10-year ASCVD risk score (Arnett DK, et al., 2019) is: 1.7% ?  Values used to calculate the score: ?    Age: 56 years ?    Sex: Female ?    Is Non-Hispanic African American: No ?    Diabetic: No ?    Tobacco smoker: No ?    Systolic Blood Pressure: 471 mmHg ?    Is BP treated: No ?    HDL Cholesterol: 48.5 mg/dL ?    Total Cholesterol: 182 mg/dL ?Continue atorvastatin 40 mg daily. ?

## 2021-10-07 ENCOUNTER — Ambulatory Visit (HOSPITAL_COMMUNITY): Payer: Medicare HMO | Admitting: Licensed Clinical Social Worker

## 2021-10-07 DIAGNOSIS — F411 Generalized anxiety disorder: Secondary | ICD-10-CM

## 2021-10-07 DIAGNOSIS — F4310 Post-traumatic stress disorder, unspecified: Secondary | ICD-10-CM | POA: Insufficient documentation
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
## 2021-10-07 NOTE — Plan of Care (Signed)
?  Problem: Anxiety Disorder CCP Problem  1 GAD  ?Goal: LTG: Patient will score less than 5 on the Generalized Anxiety Disorder 7 Scale (GAD-7) ?Outcome: Not Progressing ?Goal: STG: Patient will participate in at least 80% of scheduled individual psychotherapy sessions ?Outcome: Progressing ?Goal: STG: Patient will complete at least 80% of assigned homework ?Outcome: Progressing ?Goal: STG: Patient will practice problem solving skills 3 times per week for the next 4 weeks ?Outcome: Progressing ?Goal: STG: Report a decrease in anxiety symptoms as evidenced by an overall reduction in anxiety score by a minimum of 25% on the Generalized Anxiety Disorder Scale ?Outcome: Not Progressing ?  ?Problem: Depression CCP Problem  1 MDD  ?Goal: LTG: Anwen "Mysty [name]" WILL SCORE LESS THAN 10 ON THE PATIENT HEALTH QUESTIONNAIRE (PHQ-9) ?Outcome: Completed/Met ?Goal: STG: Leanah "[place]ra [name]" WILL PARTICIPATE IN AT LEAST 80% OF SCHEDULED INDIVIDUAL PSYCHOTHERAPY SESSIONS ?Outcome: Progressing ?Goal: STG: Sanjana "[name]" WILL COMPLETE AT LEAST 80% OF ASSIGNED HOMEWORK ?Outcome: Progressing ?Intervention: WORK WITH Jimmi "Kathi [name]" TO TRACK SYMPTOMS, TRIGGERS AND/OR SKILL USE THROUGH A MOOD CHART, DIARY CARD, OR JOURNAL ?Intervention: ENCOURAGE Calla "Zilah [name]" TO PARTICIPATE IN RECOVERY PEER SUPPORT ACTIVITIES WEEKLY ?Intervention: Administer the PHQ-9 or MADRS weekly for 4 weeks ?Intervention: PROVIDE Quaneshia "Dorie [name]" WITH EDUCATIONAL INFORMATION AND READING MATERIAL ON DISSOCIATION, ITS CAUSES, AND SYMPTOMS ?  ?

## 2021-10-20 ENCOUNTER — Ambulatory Visit: Payer: Medicare HMO | Admitting: Gastroenterology

## 2021-10-20 ENCOUNTER — Encounter: Payer: Self-pay | Admitting: Gastroenterology

## 2021-10-20 VITALS — BP 130/70 | HR 94 | Ht 61.0 in | Wt 173.0 lb

## 2021-10-20 DIAGNOSIS — K2100 Gastro-esophageal reflux disease with esophagitis, without bleeding: Secondary | ICD-10-CM | POA: Diagnosis not present

## 2021-10-20 DIAGNOSIS — K5909 Other constipation: Secondary | ICD-10-CM | POA: Diagnosis not present

## 2021-10-20 DIAGNOSIS — K644 Residual hemorrhoidal skin tags: Secondary | ICD-10-CM

## 2021-10-20 MED ORDER — LINACLOTIDE 290 MCG PO CAPS: 290.0000 ug | ORAL_CAPSULE | Freq: Every day | ORAL | 0 refills | Status: DC

## 2021-10-20 MED ORDER — PANTOPRAZOLE SODIUM 40 MG PO TBEC: 40.0000 mg | DELAYED_RELEASE_TABLET | Freq: Two times a day (BID) | ORAL | 3 refills | Status: DC

## 2021-10-20 MED ORDER — HYDROCORTISONE (PERIANAL) 2.5 % EX CREA: 1.0000 "application " | TOPICAL_CREAM | Freq: Three times a day (TID) | CUTANEOUS | 1 refills | Status: DC

## 2021-10-20 NOTE — Patient Instructions (Addendum)
Si tiene 65 a?os o m?s, su ?ndice de masa corporal debe estar entre 24 y 80. Su ?ndice de masa corporal es de 32,69 kg/m?Marland Kitchen Si esto est? fuera del rango mencionado anteriormente, considere hacer un seguimiento con su proveedor de atenci?n primaria. ? ?Si tiene 82 a?os o menos, su ?ndice de masa corporal debe estar entre 106 y 61. Su ?ndice de masa corporal es de 32,69 kg/m?Marland Kitchen Si esto est? fuera del rango mencionado anteriormente, considere hacer un seguimiento con su proveedor de atenci?n primaria. ? ?________________________________________________________ ?Los proveedores de Financial controller GI desean alentarlo a que use MYCHART para comunicarse con los proveedores para solicitudes o preguntas que no sean urgentes. Debido a los Astronomer de espera en el tel?fono, enviar un mensaje a su proveedor por [employer] puede ser una forma m?s r?pida y eficiente de Tax adviser. Espere 48 horas h?biles para obtener [employer]. Recuerde que esto es para solicitudes no urgentes. ?_______________________________________________________ ? ?Jessica recomienda que complete una purga intestinal (para limpiar sus intestinos). Por favor haz lo siguiente: ?Compre una botella de Miralax sin receta, as? como una caja de tabletas de dulcolax de 5 mg. ?Tome 4 tabletas de dulcolax. ?Espere 1 hora. ?Luego, beber? de 6 a 8 tapones de Miralax mezclados con una cantidad [place] de agua/jugo/gatorade (puede elegir cu?l de estos l?quidos beber) durante las pr?ximas 2 a 3 horas. ?Debe esperar resultados dentro de 1 a 6 horas despu?s de completar la purga intestinal. ? ?Le hemos dado muestras de Linzess 286mg. Tomar 1 al d?a. ? ?llame o env?e un mensaje en 10 d?as con una actualizaci?n sobre los s?ntomas y el estre?imiento. ? ? ?aumente su Protonix a dos veces al d?a. le hemos enviado una nueva receta. ? ? ??Fue un placer verte hoy! ? ??Gracias por confiar en m? para su cuidado gastrointestinal! ? ? ?

## 2021-10-20 NOTE — Progress Notes (Signed)
Noted  

## 2021-10-20 NOTE — Progress Notes (Signed)
? ? ? ?10/20/2021 ?Raizel [name] ?[id number] ?04-07-1966 ? ? ?HISTORY OF PRESENT ILLNESS: This is a 56 year old female who is a patient of Dr. Blanch Media.  She is here today with a few different complaints.  She complains of a small lump on her anus.  She says that she is constipated almost every day.  She reports left-sided abdominal pain.  She says the abdominal pain has been present for a while.  She says the Dr. Henrene Pastor told her this is probably coming from her back issues and radiating around her abdomen.  Had an unremarkable CT scan in 01/2021.  In regards to the constipation, she is on oxycodone and Lyrica.  Says she is using MiraLAX and a stool softener without much results.  She also reports a bitter taste in her mouth.  Is on pantoprazole 40 mg daily.  That has helped with burning, but still reports this bitter taste. ? ?Colonoscopy 02/2021: ?Diverticulosis and internal hemorrhoids. ? ?EGD 02/2021: ?Normal. ? ?The entire visit was performed via interpreter as patient is primarily [place] speaking. ? ? ?Past Medical History:  ?Diagnosis Date  ? Acid reflux   ? Adenomatous colon polyp   ? 2012  ? Allergy   ? Anxiety   ? Constipation, chronic   ? Degenerative disc disease, cervical   ? s/p epidural injections  ? Depression   ? Fatty liver disease, nonalcoholic   ? per ultrasound  ? Fibromyalgia   ? Hypercholesterolemia   ? NSVD (normal spontaneous vaginal delivery)   ? X2  ? Prediabetes   ? a1c 6.1  ? Seizures ([place])   ? with dental procedure  ? Vitamin D deficiency   ? ?Past Surgical History:  ?Procedure Laterality Date  ? BACK SURGERY  2008  ? BREAST SURGERY    ? REDUCTIVE MAMMOPLASTY  ? herniated disc    ? LAPAROSCOPIC CHOLECYSTECTOMY    ? LUMBAR DISC SURGERY    ? L-4 AND L-5   ? PELVIC LAPAROSCOPY    ? BTSP  ? TONSILLECTOMY AND ADENOIDECTOMY    ? VAGINAL HYSTERECTOMY    ? Post Repair/TVT Sling  ? ? reports that she has never smoked. She has never used smokeless tobacco. She reports that she does not drink  alcohol and does not use drugs. ?family history includes Cancer in her sister; Colon cancer in her sister; Hypertension in her sister. ?Allergies  ?Allergen Reactions  ? Latex Swelling  ? Other   ?  LATEX, kiwi, papaya, fish  ? Macrodantin [Nitrofurantoin] Rash and Other (See Comments)  ?  Rash, pain behind eyes  ? ? ?  ?Outpatient Encounter Medications as of 10/20/2021  ?Medication Sig  ? atorvastatin (LIPITOR) 40 MG tablet Take 1 tablet (40 mg total) by mouth daily.  ? Calcium Carbonate-Vitamin D (CALTRATE 600+D PO) Take by mouth.  ? cholecalciferol (VITAMIN D3) 25 MCG (1000 UNIT) tablet Take 3 tablets (3,000 Units total) by mouth daily.  ? metFORMIN (GLUCOPHAGE) 500 MG tablet TAKE 1 TABLET(500 MG) BY MOUTH TWICE DAILY WITH A MEAL  ? Misc. Devices (BATH/SHOWER SEAT) MISC Use shower chair/seat while bathing  ? naloxone (NARCAN) nasal spray 4 mg/0.1 mL Place into the nose.  ? oxyCODONE-acetaminophen (PERCOCET) 7.5-325 MG tablet Take by mouth.  ? pantoprazole (PROTONIX) 40 MG tablet TAKE 1 TABLET(40 MG) BY MOUTH DAILY  ? pregabalin (LYRICA) 50 MG capsule Take 50 mg by mouth 2 (two) times daily.  ? ?Facility-Administered Encounter Medications as of 10/20/2021  ?Medication  ? dextrose  5 % solution  ? ? ? ?REVIEW OF SYSTEMS  : All other systems reviewed and negative except where noted in the History of Present Illness. ? ? ?PHYSICAL EXAM: ?BP 130/70   Pulse 94   Ht '5\' 1"'$  (1.549 m)   Wt 173 lb (78.5 kg)   BMI 32.69 kg/m?  ?General: Well developed female in no acute distress ?Head: Normocephalic and atraumatic ?Eyes:  Sclerae anicteric, conjunctiva pink. ?Ears: Normal auditory acuity ?Lungs: Clear throughout to auscultation; no W/R/R. ?Heart: Regular rate and rhythm; no M/R/G. ?Abdomen: Soft, non-distended.  BS present.  Mild diffuse TTP. ?Rectal:  Small firm external hemorrhoid without inflammation.  No other abnormalities noted. ?Musculoskeletal: Symmetrical with no gross deformities  ?Skin: No lesions on visible  extremities ?Extremities: No edema  ?Neurological: Alert oriented x 4, grossly non-focal ?Psychological:  Alert and cooperative. Normal mood and affect ? ?ASSESSMENT AND PLAN: ?*External hemorrhoid: Small external hemorrhoid that was firm, but not really inflamed appearing on exam today.  We will prescribe hydrocortisone cream to use 3 times daily for a week to 10 days. ?*Chronic constipation will instruct on MiraLAX bowel purge and then will begin taking Linzess 290 mcg daily.  Samples given.  She will call back with an update on her symptoms and let us know how she is doing so we can determine if we will send a prescription for the 290 mcg or if we need to lower the dose or telemetry change completely if that is not working for her. ?*GERD: Describes a bitter taste in her mouth: Is on once daily PPI.  Usually takes this in the morning.  Will add the evening dose for now as well.  New prescription sent to pharmacy. ? ? ?CC:  Horald Pollen, * ? ?  ?

## 2021-10-23 DIAGNOSIS — M4722 Other spondylosis with radiculopathy, cervical region: Secondary | ICD-10-CM | POA: Diagnosis not present

## 2021-10-23 DIAGNOSIS — G894 Chronic pain syndrome: Secondary | ICD-10-CM | POA: Diagnosis not present

## 2021-10-23 DIAGNOSIS — M797 Fibromyalgia: Secondary | ICD-10-CM | POA: Diagnosis not present

## 2021-10-23 DIAGNOSIS — M5416 Radiculopathy, lumbar region: Secondary | ICD-10-CM | POA: Diagnosis not present

## 2021-11-02 ENCOUNTER — Ambulatory Visit (HOSPITAL_COMMUNITY): Payer: Medicare HMO | Admitting: Licensed Clinical Social Worker

## 2021-12-02 ENCOUNTER — Ambulatory Visit (HOSPITAL_COMMUNITY): Payer: Self-pay | Admitting: Licensed Clinical Social Worker

## 2021-12-21 DIAGNOSIS — M5416 Radiculopathy, lumbar region: Secondary | ICD-10-CM | POA: Diagnosis not present

## 2021-12-21 DIAGNOSIS — F1120 Opioid dependence, uncomplicated: Secondary | ICD-10-CM | POA: Diagnosis not present

## 2021-12-21 DIAGNOSIS — M797 Fibromyalgia: Secondary | ICD-10-CM | POA: Diagnosis not present

## 2021-12-21 DIAGNOSIS — G894 Chronic pain syndrome: Secondary | ICD-10-CM | POA: Diagnosis not present

## 2021-12-21 DIAGNOSIS — M47812 Spondylosis without myelopathy or radiculopathy, cervical region: Secondary | ICD-10-CM | POA: Diagnosis not present

## 2021-12-21 DIAGNOSIS — M4722 Other spondylosis with radiculopathy, cervical region: Secondary | ICD-10-CM | POA: Diagnosis not present

## 2022-01-27 DIAGNOSIS — M47812 Spondylosis without myelopathy or radiculopathy, cervical region: Secondary | ICD-10-CM | POA: Diagnosis not present

## 2022-02-09 DIAGNOSIS — M4722 Other spondylosis with radiculopathy, cervical region: Secondary | ICD-10-CM | POA: Diagnosis not present

## 2022-02-09 DIAGNOSIS — G894 Chronic pain syndrome: Secondary | ICD-10-CM | POA: Diagnosis not present

## 2022-02-09 DIAGNOSIS — M797 Fibromyalgia: Secondary | ICD-10-CM | POA: Diagnosis not present

## 2022-02-09 DIAGNOSIS — F1120 Opioid dependence, uncomplicated: Secondary | ICD-10-CM | POA: Diagnosis not present

## 2022-02-09 DIAGNOSIS — M5416 Radiculopathy, lumbar region: Secondary | ICD-10-CM | POA: Diagnosis not present

## 2022-02-09 DIAGNOSIS — M47812 Spondylosis without myelopathy or radiculopathy, cervical region: Secondary | ICD-10-CM | POA: Diagnosis not present

## 2022-03-03 ENCOUNTER — Encounter: Payer: Self-pay | Admitting: Emergency Medicine

## 2022-03-03 ENCOUNTER — Ambulatory Visit (INDEPENDENT_AMBULATORY_CARE_PROVIDER_SITE_OTHER): Payer: Medicare HMO | Admitting: Emergency Medicine

## 2022-03-03 VITALS — BP 130/82 | HR 80 | Temp 98.2°F | Ht 61.0 in | Wt 168.1 lb

## 2022-03-03 DIAGNOSIS — Z23 Encounter for immunization: Secondary | ICD-10-CM | POA: Diagnosis not present

## 2022-03-03 DIAGNOSIS — I700 Atherosclerosis of aorta: Secondary | ICD-10-CM

## 2022-03-03 DIAGNOSIS — E785 Hyperlipidemia, unspecified: Secondary | ICD-10-CM | POA: Diagnosis not present

## 2022-03-03 DIAGNOSIS — Z1329 Encounter for screening for other suspected endocrine disorder: Secondary | ICD-10-CM | POA: Diagnosis not present

## 2022-03-03 DIAGNOSIS — Z130 Encounter for screening for diseases of the blood and blood-forming organs and certain disorders involving the immune mechanism: Secondary | ICD-10-CM

## 2022-03-03 DIAGNOSIS — M797 Fibromyalgia: Secondary | ICD-10-CM

## 2022-03-03 DIAGNOSIS — Z13220 Encounter for screening for lipoid disorders: Secondary | ICD-10-CM

## 2022-03-03 DIAGNOSIS — Z13228 Encounter for screening for other metabolic disorders: Secondary | ICD-10-CM

## 2022-03-03 DIAGNOSIS — R7303 Prediabetes: Secondary | ICD-10-CM

## 2022-03-03 DIAGNOSIS — G894 Chronic pain syndrome: Secondary | ICD-10-CM | POA: Diagnosis not present

## 2022-03-03 DIAGNOSIS — Z1159 Encounter for screening for other viral diseases: Secondary | ICD-10-CM | POA: Diagnosis not present

## 2022-03-03 DIAGNOSIS — K2100 Gastro-esophageal reflux disease with esophagitis, without bleeding: Secondary | ICD-10-CM

## 2022-03-03 DIAGNOSIS — K5790 Diverticulosis of intestine, part unspecified, without perforation or abscess without bleeding: Secondary | ICD-10-CM | POA: Diagnosis not present

## 2022-03-03 DIAGNOSIS — Z0000 Encounter for general adult medical examination without abnormal findings: Secondary | ICD-10-CM | POA: Diagnosis not present

## 2022-03-03 DIAGNOSIS — F4310 Post-traumatic stress disorder, unspecified: Secondary | ICD-10-CM

## 2022-03-03 DIAGNOSIS — F32A Depression, unspecified: Secondary | ICD-10-CM

## 2022-03-03 DIAGNOSIS — K760 Fatty (change of) liver, not elsewhere classified: Secondary | ICD-10-CM | POA: Diagnosis not present

## 2022-03-03 LAB — COMPREHENSIVE METABOLIC PANEL
ALT: 34 U/L (ref 0–35)
AST: 26 U/L (ref 0–37)
Albumin: 4.5 g/dL (ref 3.5–5.2)
Alkaline Phosphatase: 73 U/L (ref 39–117)
BUN: 9 mg/dL (ref 6–23)
CO2: 30 mEq/L (ref 19–32)
Calcium: 9.7 mg/dL (ref 8.4–10.5)
Chloride: 102 mEq/L (ref 96–112)
Creatinine, Ser: 0.6 mg/dL (ref 0.40–1.20)
GFR: 100.18 mL/min (ref >60.00)
Glucose, Bld: 97 mg/dL (ref 70–99)
Potassium: 4.3 mEq/L (ref 3.5–5.1)
Sodium: 139 mEq/L (ref 135–145)
Total Bilirubin: 0.6 mg/dL (ref 0.2–1.2)
Total Protein: 8.1 g/dL (ref 6.0–8.3)

## 2022-03-03 LAB — CBC WITH DIFFERENTIAL/PLATELET
Basophils Absolute: 0 10*3/uL (ref 0.0–0.1)
Basophils Relative: 0.6 % (ref 0.0–3.0)
Eosinophils Absolute: 0.3 10*3/uL (ref 0.0–0.7)
Eosinophils Relative: 4.7 % (ref 0.0–5.0)
HCT: 38.5 % (ref 36.0–46.0)
Hemoglobin: 12.9 g/dL (ref 12.0–15.0)
Lymphocytes Relative: 48.3 % — ABNORMAL HIGH (ref 12.0–46.0)
Lymphs Abs: 3.3 10*3/uL (ref 0.7–4.0)
MCHC: 33.5 g/dL (ref 30.0–36.0)
MCV: 89.6 fl (ref 78.0–100.0)
Monocytes Absolute: 0.6 10*3/uL (ref 0.1–1.0)
Monocytes Relative: 9.1 % (ref 3.0–12.0)
Neutro Abs: 2.6 10*3/uL (ref 1.4–7.7)
Neutrophils Relative %: 37.3 % — ABNORMAL LOW (ref 43.0–77.0)
Platelets: 319 10*3/uL (ref 150.0–400.0)
RBC: 4.3 Mil/uL (ref 3.87–5.11)
RDW: 13 % (ref 11.5–15.5)
WBC: 6.9 10*3/uL (ref 4.0–10.5)

## 2022-03-03 LAB — LIPID PANEL
Cholesterol: 157 mg/dL (ref 0–200)
HDL: 53.4 mg/dL (ref >39.00)
NonHDL: 103.33
Total CHOL/HDL Ratio: 3
Triglycerides: 203 mg/dL — ABNORMAL HIGH (ref 0.0–149.0)
VLDL: 40.6 mg/dL — ABNORMAL HIGH (ref 0.0–40.0)

## 2022-03-03 LAB — LDL CHOLESTEROL, DIRECT: Direct LDL: 100 mg/dL

## 2022-03-03 LAB — HEMOGLOBIN A1C: Hgb A1c MFr Bld: 6.3 % (ref 4.6–6.5)

## 2022-03-03 NOTE — Progress Notes (Signed)
[name] 56 y.o.   Chief Complaint  Patient presents with   Annual Exam    Concerns about her chronic pain, pt want labs to check liver and kidney function    HISTORY OF PRESENT ILLNESS: This is a 56 y.o. female here for annual exam. History of chronic pain.  Opioid dependent.  Sees chronic pain management doctor on a regular basis. History of dyslipidemia and prediabetes. No complaints or medical concerns today.  HPI   Prior to Admission medications   Medication Sig Start Date End Date Taking? Authorizing Provider  atorvastatin (LIPITOR) 40 MG tablet Take 1 tablet (40 mg total) by mouth daily. 09/28/21  Yes Makendra Vigeant, Ines Bloomer, MD  cholecalciferol (VITAMIN D3) 25 MCG (1000 UNIT) tablet Take 3 tablets (3,000 Units total) by mouth daily. 02/28/20  Yes Jacelyn Pi, Lilia Argue, MD  metFORMIN (GLUCOPHAGE) 500 MG tablet TAKE 1 TABLET(500 MG) BY MOUTH TWICE DAILY WITH A MEAL 08/05/21  Yes Ryman Rathgeber, Ines Bloomer, MD  oxyCODONE-acetaminophen (PERCOCET) 7.5-325 MG tablet Take by mouth. 02/25/22 04/26/22 Yes [provider]  pantoprazole (PROTONIX) 40 MG tablet Take 1 tablet (40 mg total) by mouth 2 (two) times daily. 10/20/21  Yes Zehr, Laban Emperor, PA-C  pregabalin (LYRICA) 50 MG capsule Take 50 mg by mouth 2 (two) times daily.   Yes [provider]  Calcium Carbonate-Vitamin D (CALTRATE 600+D PO) Take by mouth.    [provider]  hydrocortisone (ANUSOL-HC) 2.5 % rectal cream Place 1 application. rectally 3 (three) times daily. Patient not taking: Reported on 03/03/2022 10/20/21   Zehr, Laban Emperor, PA-C  linaclotide Rolan Lipa) 290 MCG CAPS capsule Take 1 capsule (290 mcg total) by mouth daily before breakfast. Patient not taking: Reported on 03/03/2022 10/20/21   Zehr, Laban Emperor, PA-C  Misc. Devices (BATH/SHOWER SEAT) MISC Use shower chair/seat while bathing Patient not taking: Reported on 03/03/2022 05/02/18   Jacelyn Pi, Lilia Argue, MD  naloxone [facility]) nasal spray 4  mg/0.1 mL Place into the nose. Patient not taking: Reported on 03/03/2022 07/29/20   [provider]    Allergies  Allergen Reactions   Latex Swelling   Other     LATEX, kiwi, papaya, fish   Macrodantin [Nitrofurantoin] Rash and Other (See Comments)    Rash, pain behind eyes    Patient Active Problem List   Diagnosis Date Noted   External hemorrhoids 10/20/2021   PTSD (post-traumatic stress disorder) 10/07/2021   GAD (generalized anxiety disorder) 07/29/2021   Severe episode of recurrent major depressive disorder, without psychotic features ([place]) 07/29/2021   Atherosclerosis of aorta ([place]) 03/16/2021   Diverticulosis 03/16/2021   Chronic depression 03/16/2021   Abnormal cortisol level 10/01/2020   History of Cushing disease 08/27/2020   Gastroesophageal reflux disease with esophagitis without hemorrhage 08/27/2020   Chronic pain syndrome 08/27/2020   History of fatty infiltration of liver 08/27/2020   Hepatic steatosis 01/16/2019   Cushingoid facies 10/17/2018   Acanthosis nigricans 10/17/2018   Hypertriglyceridemia 10/17/2018   Adenomatous colon polyp    Degenerative disc disease, cervical    Fatty liver disease, nonalcoholic    Prediabetes    Vitamin D deficiency    Lumbosacral radiculopathy 06/04/2012   Pain, chronic due to trauma 06/04/2012   Chronic constipation 09/29/2011   History of colonic polyps 09/29/2011   Fibromyalgia    Hypercholesterolemia    DYSPEPSIA, CHRONIC 11/06/2008   Dyslipidemia 09/15/2007    Past Medical History:  Diagnosis Date   Acid reflux    Adenomatous  colon polyp    2012   Allergy    Anxiety    Constipation, chronic    Degenerative disc disease, cervical    s/p epidural injections   Depression    Fatty liver disease, nonalcoholic    per ultrasound   Fibromyalgia    Hypercholesterolemia    NSVD (normal spontaneous vaginal delivery)    X2   Prediabetes    a1c 6.1   Seizures (HCC)    with dental procedure   Vitamin D  deficiency     Past Surgical History:  Procedure Laterality Date   BACK SURGERY  2008   BREAST SURGERY     REDUCTIVE MAMMOPLASTY   herniated disc     LAPAROSCOPIC CHOLECYSTECTOMY     LUMBAR DISC SURGERY     L-4 AND L-5    PELVIC LAPAROSCOPY     BTSP   TONSILLECTOMY AND ADENOIDECTOMY     VAGINAL HYSTERECTOMY     Post Repair/TVT Sling    Social History   Socioeconomic History   Marital status: Married    Spouse name: Not on file   Number of children: 2   Years of education: Not on file   Highest education level: Not on file  Occupational History    Employer: [employer]  Tobacco Use   Smoking status: Never   Smokeless tobacco: Never  Vaping Use   Vaping Use: Never used  Substance and Sexual Activity   Alcohol use: No   Drug use: No   Sexual activity: Not Currently    Partners: Male    Comment: 1st intercourse- 76, partners- 2, married- 66 yrs   Other Topics Concern   Not on file  Social History Narrative   Not on file   Social Determinants of Health   Financial Resource Strain: Not on file  Food Insecurity: Not on file  Transportation Needs: Not on file  Physical Activity: Not on file  Stress: Not on file  Social Connections: Not on file  Intimate Partner Violence: Not on file    Family History  Problem Relation Age of Onset   Hypertension Sister    Cancer Sister        colon ca... stepsister   Colon cancer Sister    Esophageal cancer Neg Hx    Stomach cancer Neg Hx    Rectal cancer Neg Hx      Review of Systems  Constitutional: Negative.  Negative for chills and fever.  HENT: Negative.  Negative for congestion and sore throat.   Respiratory: Negative.  Negative for cough and shortness of breath.   Cardiovascular: Negative.  Negative for chest pain.  Gastrointestinal: Negative.  Negative for abdominal pain, diarrhea, nausea and vomiting.  Genitourinary: Negative.   Musculoskeletal:  Positive for back pain, joint pain and neck pain.   Skin:  Positive for rash (Chronic skin rashes.  Has seen 3 different dermatologists).  Neurological:  Negative for dizziness and headaches.  All other systems reviewed and are negative.  Today's Vitals   03/03/22 0803  BP: 130/82  Pulse: 80  Temp: 98.2 F (36.8 C)  TempSrc: Oral  SpO2: 95%  Weight: 168 lb 2 oz (76.3 kg)  Height: '5\' 1"'$  (1.549 m)   Body mass index is 31.77 kg/m. Wt Readings from Last 3 Encounters:  03/03/22 168 lb 2 oz (76.3 kg)  10/20/21 173 lb (78.5 kg)  09/28/21 171 lb 4 oz (77.7 kg)     Physical Exam Vitals reviewed.  Constitutional:  Appearance: Normal appearance.  HENT:     Head: Normocephalic.     Right Ear: Tympanic membrane, ear canal and external ear normal.     Left Ear: Tympanic membrane, ear canal and external ear normal.     Mouth/Throat:     Mouth: Mucous membranes are moist.     Pharynx: Oropharynx is clear.  Eyes:     Extraocular Movements: Extraocular movements intact.     Conjunctiva/sclera: Conjunctivae normal.     Pupils: Pupils are equal, round, and reactive to light.  Cardiovascular:     Rate and Rhythm: Normal rate and regular rhythm.     Pulses: Normal pulses.     Heart sounds: Normal heart sounds.  Pulmonary:     Effort: Pulmonary effort is normal.     Breath sounds: Normal breath sounds.  Abdominal:     General: There is no distension.     Palpations: Abdomen is soft.     Tenderness: There is no abdominal tenderness.  Musculoskeletal:     Cervical back: No tenderness.  Lymphadenopathy:     Cervical: No cervical adenopathy.  Skin:    General: Skin is warm and dry.     Capillary Refill: Capillary refill takes less than 2 seconds.  Neurological:     General: No focal deficit present.     Mental Status: She is alert and oriented to person, place, and time.  Psychiatric:        Mood and Affect: Mood normal.        Behavior: Behavior normal.      ASSESSMENT & PLAN: Problem List Items Addressed This Visit        Cardiovascular and Mediastinum   Atherosclerosis of aorta (HCC)   Relevant Orders   Lipid panel     Digestive   Fatty liver disease, nonalcoholic   Relevant Orders   Lipid panel   Gastroesophageal reflux disease with esophagitis without hemorrhage   Diverticulosis     Other   Dyslipidemia   Relevant Orders   Lipid panel   Fibromyalgia   Relevant Medications   oxyCODONE-acetaminophen (PERCOCET) 7.5-325 MG tablet   Prediabetes   Relevant Orders   Hemoglobin A1c   Chronic pain syndrome   Relevant Medications   oxyCODONE-acetaminophen (PERCOCET) 7.5-325 MG tablet   Chronic depression   PTSD (post-traumatic stress disorder)   Other Visit Diagnoses     Routine general medical examination at a health care facility    -  Primary   Need for hepatitis C screening test       Relevant Orders   Hepatitis C antibody screen   Screening for deficiency anemia       Relevant Orders   CBC with Differential   Screening for lipoid disorders       Screening for endocrine, metabolic and immunity disorder       Relevant Orders   Comprehensive metabolic panel   Need for shingles vaccine          Modifiable risk factors discussed with patient. Anticipatory guidance according to age provided. The following topics were also discussed: Social Determinants of Health Smoking.  Non-smoker Diet and nutrition Benefits of exercise Cancer screening and review of most recent mammogram and colonoscopy reports Vaccinations recommendations Cardiovascular risk assessment Review of multiple chronic medical problems and their management Review of all medications Mental health including depression and anxiety Fall and accident prevention   Patient Instructions  Mantenimiento de la salud en [place] Maintenance, Female Adoptar  un estilo de vida saludable y recibir atencin preventiva son importantes para promover la salud y Musician. Consulte al mdico sobre: El esquema  adecuado para hacerse pruebas y exmenes peridicos. Cosas que puede hacer por su cuenta para prevenir enfermedades y Home sano. Qu debo saber sobre la dieta, el peso y el ejercicio? Consuma una dieta saludable  Consuma una dieta que incluya muchas verduras, frutas, productos lcteos con bajo contenido de [place] y Advertising account planner. No consuma muchos alimentos ricos en grasas slidas, azcares agregados o sodio. Mantenga un peso saludable El ndice de masa muscular [facility]) se [place] para identificar problemas de [place]. Proporciona una estimacin de la grasa corporal basndose en el peso y la altura. Su mdico puede ayudarle a Radiation protection practitioner [place] y a Scientist, forensic o Theatre manager un peso saludable. Haga ejercicio con regularidad Haga ejercicio con regularidad. Esta es una de las prcticas ms importantes que puede hacer por su salud. La [employer] de los adultos deben seguir estas pautas: Optometrist, al menos, 150 minutos de actividad fsica por semana. El ejercicio debe aumentar la frecuencia cardaca y Nature conservation officer transpirar (ejercicio de intensidad moderada). Hacer ejercicios de fortalecimiento por lo [employer] por semana. Agregue esto a su plan de ejercicio de intensidad moderada. Pase menos tiempo sentada. Incluso la actividad fsica ligera puede ser beneficiosa. Controle sus niveles de colesterol y lpidos en la sangre Comience a realizarse anlisis de lpidos y Research officer, trade union en la sangre a los 28 aos y luego reptalos cada 5 aos. Hgase controlar los niveles de colesterol con mayor frecuencia si: Sus niveles de lpidos y colesterol son altos. Es mayor de 60 aos. Presenta un alto riesgo de padecer enfermedades cardacas. Qu debo saber sobre las pruebas de deteccin del cncer? Segn su historia clnica y sus antecedentes familiares, es posible que deba realizarse pruebas de deteccin del cncer en diferentes edades. Esto puede incluir pruebas de deteccin de lo siguiente: Cncer de mama. Cncer de  cuello uterino. Cncer colorrectal. Cncer de piel. Cncer de pulmn. Qu debo saber sobre la enfermedad cardaca, la diabetes y la hipertensin arterial? Presin arterial y enfermedad cardaca La hipertensin arterial causa enfermedades cardacas y [place] el riesgo de accidente cerebrovascular. Es ms probable que esto se manifieste en las personas que tienen lecturas de presin arterial alta o tienen sobrepeso. Hgase controlar la presin arterial: Cada 3 a 5 aos si tiene entre 18 y 76 aos. Todos los aos si es mayor de 40 aos. Diabetes Realcese exmenes de deteccin de la diabetes con regularidad. Este anlisis revisa el nivel de azcar en la sangre en [place]. Hgase las pruebas de deteccin: Cada tres aos despus de los 41 aos de edad si tiene un peso normal y un bajo riesgo de padecer diabetes. Con ms frecuencia y a partir de [place] edad inferior si tiene sobrepeso o un alto riesgo de padecer diabetes. Qu debo saber sobre la prevencin de infecciones? Hepatitis B Si tiene un riesgo ms alto de contraer hepatitis B, debe someterse a un examen de deteccin de este virus. Hable con el mdico para averiguar si tiene riesgo de contraer la infeccin por hepatitis B. Hepatitis C Se recomienda el anlisis a: [employer] 1945 y 1965. Todas las personas que tengan un riesgo de haber contrado hepatitis C. Enfermedades de transmisin sexual (ETS) Hgase las pruebas de Programme researcher, broadcasting/film/video de ITS, incluidas la gonorrea y la clamidia, si: Es sexualmente activa y es menor de 38 aos. Es mayor de 27 aos, y Investment banker, operational  informa que corre riesgo de [employer] tipo de infecciones. La actividad sexual ha cambiado desde que le hicieron la ltima prueba de deteccin y tiene un riesgo mayor de Best boy clamidia o Radio broadcast assistant. Pregntele al mdico si usted tiene riesgo. Pregntele al mdico si usted tiene un alto riesgo de Museum/gallery curator VIH. El mdico tambin puede recomendarle un medicamento recetado  para ayudar a evitar la infeccin por el VIH. Si elige tomar medicamentos para prevenir el VIH, primero debe [employer] de deteccin del VIH. Luego debe hacerse anlisis cada 3 meses mientras est tomando los medicamentos. Embarazo Si est por dejar de Librarian, academic (fase premenopusica) y usted puede quedar [place], busque asesoramiento antes de [place]. Tome de 400 a 800 microgramos (mcg) de cido [employer] si [place]. Pida mtodos de control de la natalidad (anticonceptivos) si desea evitar un embarazo no deseado. Osteoporosis y [place] La osteoporosis es una enfermedad en la que los huesos pierden los minerales y la fuerza por el avance de la edad. El resultado pueden ser fracturas en los [place]. Si tiene 38 aos o ms, o si est en riesgo de sufrir osteoporosis y fracturas, pregunte a su mdico si debe: Hacerse pruebas de deteccin de prdida sea. Tomar un suplemento de calcio o de vitamina D para reducir el riesgo de fracturas. Recibir terapia de reemplazo hormonal (TRH) para tratar los sntomas de la menopausia. Siga estas indicaciones en su casa: Consumo de alcohol No beba alcohol si: Su mdico le indica no hacerlo. Est embarazada, puede estar embarazada o est tratando de [place]. Si bebe alcohol: Limite la cantidad que bebe a lo siguiente: De 0 a 1 bebida por da. Sepa cunta cantidad de alcohol hay en las bebidas que toma. En los [place], una medida equivale a una botella de cerveza de 12 oz (355 ml), un vaso de vino de 5 oz (148 ml) o un vaso de una bebida alcohlica de alta graduacin de 1 oz (44 ml). Estilo de vida No consuma ningn producto que contenga nicotina o tabaco. Estos productos incluyen cigarrillos, tabaco para Higher education careers adviser y aparatos de vapeo, como los Psychologist, sport and exercise. Si necesita ayuda para dejar de consumir estos productos, consulte al mdico. No consuma drogas. No comparta agujas. Solicite ayuda a su  mdico si necesita apoyo o informacin para abandonar las drogas. Indicaciones generales Realcese los estudios de rutina de la salud, dentales y de Public librarian. [place]. Infrmele a su mdico si: Se siente deprimida con frecuencia. Alguna vez ha sido vctima de [place] o no se siente seguro en su casa. Resumen Adoptar un estilo de vida saludable y recibir atencin preventiva son importantes para promover la salud y Musician. Siga las instrucciones del mdico acerca de una dieta saludable, el ejercicio y la realizacin de pruebas o exmenes para Engineer, building services. Siga las instrucciones del mdico con respecto al control del colesterol y la presin arterial. Esta informacin no tiene Marine scientist el consejo del mdico. Asegrese de hacerle al mdico cualquier pregunta que tenga. Document Revised: 11/06/2020 Document Reviewed: 11/06/2020 Elsevier Patient Education  [place], MD [facility]

## 2022-03-03 NOTE — Patient Instructions (Signed)

## 2022-03-03 NOTE — Addendum Note (Signed)
Addended by: Rae Mar on: 03/03/2022 10:58 AM   Modules accepted: Orders

## 2022-03-04 LAB — HEPATITIS C ANTIBODY: Hepatitis C Ab: NONREACTIVE

## 2022-04-16 ENCOUNTER — Other Ambulatory Visit: Payer: Self-pay | Admitting: Emergency Medicine

## 2022-04-26 DIAGNOSIS — M797 Fibromyalgia: Secondary | ICD-10-CM | POA: Diagnosis not present

## 2022-04-26 DIAGNOSIS — E119 Type 2 diabetes mellitus without complications: Secondary | ICD-10-CM | POA: Diagnosis not present

## 2022-04-26 DIAGNOSIS — Z79891 Long term (current) use of opiate analgesic: Secondary | ICD-10-CM | POA: Diagnosis not present

## 2022-04-26 DIAGNOSIS — M4722 Other spondylosis with radiculopathy, cervical region: Secondary | ICD-10-CM | POA: Diagnosis not present

## 2022-04-26 DIAGNOSIS — M5416 Radiculopathy, lumbar region: Secondary | ICD-10-CM | POA: Diagnosis not present

## 2022-04-26 DIAGNOSIS — G894 Chronic pain syndrome: Secondary | ICD-10-CM | POA: Diagnosis not present

## 2022-05-04 DIAGNOSIS — F1120 Opioid dependence, uncomplicated: Secondary | ICD-10-CM | POA: Diagnosis not present

## 2022-05-18 ENCOUNTER — Telehealth: Payer: Self-pay | Admitting: Emergency Medicine

## 2022-05-18 NOTE — Telephone Encounter (Signed)
Left message for patient to call back to schedule Medicare Annual Wellness Visit   No hx of AWV eligible as of 04/14/22  Please schedule at anytime with [facility] Advisor if patient calls the office back.     Any questions, please call me at [phone]

## 2022-05-24 DIAGNOSIS — M47816 Spondylosis without myelopathy or radiculopathy, lumbar region: Secondary | ICD-10-CM | POA: Diagnosis not present

## 2022-05-24 DIAGNOSIS — M5417 Radiculopathy, lumbosacral region: Secondary | ICD-10-CM | POA: Diagnosis not present

## 2022-06-02 ENCOUNTER — Ambulatory Visit: Payer: Medicare HMO

## 2022-06-25 DIAGNOSIS — G894 Chronic pain syndrome: Secondary | ICD-10-CM | POA: Diagnosis not present

## 2022-06-25 DIAGNOSIS — M5417 Radiculopathy, lumbosacral region: Secondary | ICD-10-CM | POA: Diagnosis not present

## 2022-06-25 DIAGNOSIS — Z79891 Long term (current) use of opiate analgesic: Secondary | ICD-10-CM | POA: Diagnosis not present

## 2022-06-25 DIAGNOSIS — M47816 Spondylosis without myelopathy or radiculopathy, lumbar region: Secondary | ICD-10-CM | POA: Diagnosis not present

## 2022-06-25 DIAGNOSIS — M961 Postlaminectomy syndrome, not elsewhere classified: Secondary | ICD-10-CM | POA: Diagnosis not present

## 2022-07-02 DIAGNOSIS — H5203 Hypermetropia, bilateral: Secondary | ICD-10-CM | POA: Diagnosis not present

## 2022-07-02 LAB — HM DIABETES EYE EXAM

## 2022-07-08 DIAGNOSIS — L819 Disorder of pigmentation, unspecified: Secondary | ICD-10-CM | POA: Diagnosis not present

## 2022-08-02 DIAGNOSIS — Z1231 Encounter for screening mammogram for malignant neoplasm of breast: Secondary | ICD-10-CM | POA: Diagnosis not present

## 2022-08-02 LAB — HM MAMMOGRAPHY

## 2022-08-03 ENCOUNTER — Encounter: Payer: Self-pay | Admitting: Emergency Medicine

## 2022-08-03 ENCOUNTER — Other Ambulatory Visit: Payer: Self-pay | Admitting: Emergency Medicine

## 2022-08-03 ENCOUNTER — Ambulatory Visit (INDEPENDENT_AMBULATORY_CARE_PROVIDER_SITE_OTHER): Payer: Medicare HMO | Admitting: Emergency Medicine

## 2022-08-03 VITALS — BP 120/78 | HR 91 | Temp 98.1°F | Ht 61.0 in | Wt 168.5 lb

## 2022-08-03 DIAGNOSIS — K2100 Gastro-esophageal reflux disease with esophagitis, without bleeding: Secondary | ICD-10-CM | POA: Diagnosis not present

## 2022-08-03 DIAGNOSIS — R7303 Prediabetes: Secondary | ICD-10-CM | POA: Diagnosis not present

## 2022-08-03 DIAGNOSIS — N3281 Overactive bladder: Secondary | ICD-10-CM

## 2022-08-03 DIAGNOSIS — R399 Unspecified symptoms and signs involving the genitourinary system: Secondary | ICD-10-CM

## 2022-08-03 DIAGNOSIS — G894 Chronic pain syndrome: Secondary | ICD-10-CM

## 2022-08-03 DIAGNOSIS — G8921 Chronic pain due to trauma: Secondary | ICD-10-CM | POA: Diagnosis not present

## 2022-08-03 DIAGNOSIS — K760 Fatty (change of) liver, not elsewhere classified: Secondary | ICD-10-CM

## 2022-08-03 DIAGNOSIS — E785 Hyperlipidemia, unspecified: Secondary | ICD-10-CM

## 2022-08-03 LAB — COMPREHENSIVE METABOLIC PANEL
ALT: 40 U/L — ABNORMAL HIGH (ref 0–35)
AST: 24 U/L (ref 0–37)
Albumin: 4.9 g/dL (ref 3.5–5.2)
Alkaline Phosphatase: 82 U/L (ref 39–117)
BUN: 11 mg/dL (ref 6–23)
CO2: 28 mEq/L (ref 19–32)
Calcium: 10 mg/dL (ref 8.4–10.5)
Chloride: 100 mEq/L (ref 96–112)
Creatinine, Ser: 0.62 mg/dL (ref 0.40–1.20)
GFR: 99.1 mL/min (ref >60.00)
Glucose, Bld: 99 mg/dL (ref 70–99)
Potassium: 4.2 mEq/L (ref 3.5–5.1)
Sodium: 138 mEq/L (ref 135–145)
Total Bilirubin: 0.7 mg/dL (ref 0.2–1.2)
Total Protein: 8.5 g/dL — ABNORMAL HIGH (ref 6.0–8.3)

## 2022-08-03 LAB — POCT URINALYSIS DIPSTICK
Bilirubin, UA: NEGATIVE
Glucose, UA: NEGATIVE
Ketones, UA: NEGATIVE
Leukocytes, UA: NEGATIVE
Nitrite, UA: NEGATIVE
Protein, UA: NEGATIVE
Spec Grav, UA: 1.015 (ref 1.010–1.025)
Urobilinogen, UA: 0.2 E.U./dL
pH, UA: 6.5 (ref 5.0–8.0)

## 2022-08-03 LAB — CBC WITH DIFFERENTIAL/PLATELET
Basophils Absolute: 0 10*3/uL (ref 0.0–0.1)
Basophils Relative: 0.3 % (ref 0.0–3.0)
Eosinophils Absolute: 0.2 10*3/uL (ref 0.0–0.7)
Eosinophils Relative: 1.9 % (ref 0.0–5.0)
HCT: 42.3 % (ref 36.0–46.0)
Hemoglobin: 14 g/dL (ref 12.0–15.0)
Lymphocytes Relative: 30.9 % (ref 12.0–46.0)
Lymphs Abs: 2.7 10*3/uL (ref 0.7–4.0)
MCHC: 33.2 g/dL (ref 30.0–36.0)
MCV: 90.9 fl (ref 78.0–100.0)
Monocytes Absolute: 0.7 10*3/uL (ref 0.1–1.0)
Monocytes Relative: 8.4 % (ref 3.0–12.0)
Neutro Abs: 5.2 10*3/uL (ref 1.4–7.7)
Neutrophils Relative %: 58.5 % (ref 43.0–77.0)
Platelets: 372 10*3/uL (ref 150.0–400.0)
RBC: 4.65 Mil/uL (ref 3.87–5.11)
RDW: 13.1 % (ref 11.5–15.5)
WBC: 8.8 10*3/uL (ref 4.0–10.5)

## 2022-08-03 LAB — HEMOGLOBIN A1C: Hgb A1c MFr Bld: 6.4 % (ref 4.6–6.5)

## 2022-08-03 LAB — LIPID PANEL
Cholesterol: 186 mg/dL (ref 0–200)
HDL: 57.2 mg/dL (ref >39.00)
NonHDL: 129.2
Total CHOL/HDL Ratio: 3
Triglycerides: 253 mg/dL — ABNORMAL HIGH (ref 0.0–149.0)
VLDL: 50.6 mg/dL — ABNORMAL HIGH (ref 0.0–40.0)

## 2022-08-03 LAB — LDL CHOLESTEROL, DIRECT: Direct LDL: 104 mg/dL

## 2022-08-03 MED ORDER — OXYBUTYNIN CHLORIDE ER 5 MG PO TB24: 5.0000 mg | ORAL_TABLET | Freq: Every day | ORAL | 3 refills | Status: AC

## 2022-08-03 MED ORDER — GEMTESA 75 MG PO TABS: 75.0000 mg | ORAL_TABLET | Freq: Every day | ORAL | 3 refills | Status: DC

## 2022-08-03 NOTE — Assessment & Plan Note (Signed)
Diet and nutrition discussed.  Continue metformin 500 mg twice a day Hemoglobin A1c done today.

## 2022-08-03 NOTE — Patient Instructions (Signed)
Vejiga hiperactiva en adultos Overactive Bladder, Adult  Vejiga hiperactiva es una afeccin en la que la persona tiene una necesidad sbita y frecuente de Garment/textile technologist. La persona tambin podra tener una prdida de orina si no puede llegar al bao con la rapidez suficiente (incontinencia urinaria). A veces, los sntomas pueden interferir en el trabajo o las actividades sociales. Cules son las causas? La vejiga hiperactiva est asociada con seales nerviosas deficientes entre la vejiga y Nurse, mental health. La vejiga puede recibir la seal de vaciarse antes de que est llena. Usted tambin puede tener msculos muy sensibles que hacen que la vejiga se contraiga demasiado pronto. Esta afeccin tambin puede deberse a otros factores, como los siguientes: Afecciones mdicas: Infeccin de las vas urinarias. Infeccin de los tejidos cercanos. Agrandamiento de la prstata. Clculos en la vejiga, inflamacin o tumores. Diabetes. Debilidad de los msculos o nervios, especialmente a causa de estas afecciones: Lesin en la mdula espinal. Accidente cerebrovascular. Esclerosis mltiple. Enfermedad de Parkinson. Otras causas: Ciruga en el tero o la uretra. Consumir cafena o alcohol en exceso. Ciertos medicamentos, especialmente aquellos que eliminan el exceso de lquido del cuerpo (diurticos). Estreimiento. Qu incrementa el riesgo? Puede correr un mayor riesgo de desarrollar vejiga hiperactiva si usted: Es Media planner. Fuma. Est atravesando la menopausia. Tiene problemas de prstata. Tiene una enfermedad neurolgica, como accidente cerebrovascular, demencia, enfermedad de Parkinson o esclerosis mltiple (EM). Come o toma alcohol, alimentos picantes, cafena y otras cosas que irritan la vejiga. Tiene sobrepeso u obesidad. Cules son los signos o sntomas? Los sntomas de esta afeccin incluyen una necesidad repentina y fuerte de Garment/textile technologist. Otros sntomas pueden incluir los siguientes: Prdida de  [place]. Orina 8 o ms veces por da. Levantarse para orinar 2 o ms veces durante la noche. Cmo se diagnostica? Esta afeccin se puede diagnosticar en funcin de lo siguiente: Los sntomas y los antecedentes mdicos. Un examen fsico. Anlisis de sangre o de orina para detectar posibles causas, como una infeccin. Es posible que tambin tenga que Teacher, adult education a un mdico especialista en problemas de las vas [place]. Este mdico es Heritage manager. Cmo se trata? El tratamiento para el trastorno de vejiga hiperactiva depende de la causa y la gravedad de su enfermedad. El tratamiento puede incluir: Entrenamiento de la vejiga, por ejemplo: Aprender a controlar la necesidad urgente de Garment/textile technologist siguiendo un programa para orinar en intervalos regulares. Hacer ejercicios de Kegel para fortalecer los msculos del piso plvico que sostienen la vejiga. Dispositivos especiales, por ejemplo: Biorretroalimentacin. Utiliza sensores para ayudarlo a Personnel officer atento a las seales del cuerpo. Estimulacin elctrica. Utiliza electrodos que se colocan dentro del cuerpo (implantados) o fuera del cuerpo. Estos electrodos envan pulsos elctricos suaves para fortalecer los nervios o los msculos que controlan la vejiga. Las mujeres pueden utilizar un dispositivo de plstico, llamado pesario, que calza en la vagina y sostiene la vejiga. Medicamentos, como por ejemplo: Antibiticos para tratar las infecciones en la vejiga. Antiespasmdicos para evitar que la vejiga elimine orina en el momento incorrecto. Antidepresivos tricclicos para relajar los msculos de la vejiga. Inyecciones de toxina botulnica tipo A directamente en el tejido de la vejiga para relajar los msculos de la vejiga. Ciruga, como: Puede implantarse un dispositivo para ayudar a Chief Technology Officer las seales nerviosas que controlan la miccin. Puede implantarse un electrodo para estimular las seales elctricas en la vejiga. Puede realizarse un procedimiento  para cambiar la forma de la vejiga. Esto solo se Merchandiser, retail graves. Siga estas instrucciones en su casa: Comida y  bebida  [employer] en su estilo de vida o dieta segn las recomendaciones de su mdico. Estos pueden incluir: Electronics engineer lquidos a lo [place] y no solo con las comidas. Reducir la ingesta de cafena o alcohol. Ingerir una dieta saludable y equilibrada para English as a second language teacher estreimiento. Esto puede incluir: Elegir alimentos ricos en fibra, como frijoles, cereales integrales y frutas y verduras frescas. Limitar el consumo de alimentos ricos en grasas y azcares procesados, como alimentos fritos o dulces. Estilo de vida  Baje de [place], si es necesario. No consuma ningn producto que contenga nicotina o tabaco. Estos incluyen cigarrillos, tabaco para mascar y aparatos de vapeo, como los Psychologist, sport and exercise. Si necesita ayuda para dejar de consumir estos productos, consulte al mdico. Indicaciones generales Use los medicamentos de venta libre y los recetados solamente como se lo haya indicado el mdico. Si le recetaron un antibitico, tmelo como se lo haya indicado el mdico. No deje de tomar el antibitico aunque comience a sentirse mejor. Use los implantes o el pesario como se lo haya indicado el mdico. Si es necesario, use apsitos para Tax adviser cualquier prdida de orina que pueda [place]. Lleve un diario para registrar la cantidad de lquidos que ingiere y cundo lo hace, y cundo Mining engineer. Esto ayudar a su mdico a Retail buyer. Cumpla con todas las visitas de seguimiento. Esto es importante. Comunquese con un mdico si: Tiene fiebre o escalofros. Los sntomas no mejoran con Dispensing optician. El dolor y [place]. Tiene necesidad urgente de orinar con mayor frecuencia. Solicite ayuda de inmediato si: No puede controlar la vejiga. Resumen Vejiga hiperactiva se refiere a Engineering geologist en la que la persona tiene una necesidad sbita y  frecuente de Garment/textile technologist. Varias afecciones pueden causar sntomas de vejiga hiperactiva. El tratamiento para la vejiga hiperactiva depende de la causa y la gravedad de la afeccin. Hacer cambios en el estilo de vida, hacer ejercicios de Kegel, llevar un diario y tomar medicamentos puede ayudar con esta afeccin. Esta informacin no tiene Marine scientist el consejo del mdico. Asegrese de hacerle al mdico cualquier pregunta que tenga. Document Revised: 04/04/2020 Document Reviewed: 03/11/2020 Elsevier Patient Education  [place].

## 2022-08-03 NOTE — Assessment & Plan Note (Addendum)
Stable and asymptomatic.  Takes Protonix 40 mg twice a day as needed Upper endoscopy report from 2022 reviewed.  Positive GERD.

## 2022-08-03 NOTE — Progress Notes (Signed)
[name] 57 y.o.   Chief Complaint  Patient presents with   Acute Visit    UTI symptoms, burning , urinary incontinence, pain during urination, patient states she has been dealing with issue for a while     HISTORY OF PRESENT ILLNESS: This is a 57 y.o. female complaining of lower urinary tract send symptoms on and off for the past year.  Mostly complaining of urinary frequency and occasional incontinence affecting quality of life. Has history of chronic pain syndrome on Lyrica and oxycodone for several years.  Sees pain management clinic at [facility] on a regular basis. No other complaints or medical concerns today.  HPI   Prior to Admission medications   Medication Sig Start Date End Date Taking? Authorizing Provider  atorvastatin (LIPITOR) 40 MG tablet Take 1 tablet (40 mg total) by mouth daily. 09/28/21  Yes Daneen Volcy, Ines Bloomer, MD  Calcium Carbonate-Vitamin D (CALTRATE 600+D PO) Take by mouth.   Yes [provider]  metFORMIN (GLUCOPHAGE) 500 MG tablet TAKE 1 TABLET(500 MG) BY MOUTH TWICE DAILY WITH A MEAL 04/19/22  Yes Analicia Skibinski, [place], MD  naloxone [facility]) nasal spray 4 mg/0.1 mL Place into the nose. 07/29/20  Yes [provider]  pantoprazole (PROTONIX) 40 MG tablet Take 1 tablet (40 mg total) by mouth 2 (two) times daily. 10/20/21  Yes Zehr, Laban Emperor, PA-C  pregabalin (LYRICA) 50 MG capsule Take 50 mg by mouth 2 (two) times daily.   Yes [provider]    Allergies  Allergen Reactions   Latex Swelling   Other     LATEX, kiwi, papaya, fish   Macrodantin [Nitrofurantoin] Rash and Other (See Comments)    Rash, pain behind eyes    Patient Active Problem List   Diagnosis Date Noted   External hemorrhoids 10/20/2021   PTSD (post-traumatic stress disorder) 10/07/2021   GAD (generalized anxiety disorder) 07/29/2021   Severe episode of recurrent major depressive disorder, without psychotic features ([place]) 07/29/2021   Atherosclerosis of  aorta ([place]) 03/16/2021   Diverticulosis 03/16/2021   Chronic depression 03/16/2021   Abnormal cortisol level 10/01/2020   History of Cushing disease 08/27/2020   Gastroesophageal reflux disease with esophagitis without hemorrhage 08/27/2020   Chronic pain syndrome 08/27/2020   History of fatty infiltration of liver 08/27/2020   Hepatic steatosis 01/16/2019   Cushingoid facies 10/17/2018   Acanthosis nigricans 10/17/2018   Hypertriglyceridemia 10/17/2018   Adenomatous colon polyp    Degenerative disc disease, cervical    Fatty liver disease, nonalcoholic    Prediabetes    Vitamin D deficiency    Lumbosacral radiculopathy 06/04/2012   Pain, chronic due to trauma 06/04/2012   Chronic constipation 09/29/2011   History of colonic polyps 09/29/2011   Fibromyalgia    Hypercholesterolemia    DYSPEPSIA, CHRONIC 11/06/2008   Dyslipidemia 09/15/2007    Past Medical History:  Diagnosis Date   Acid reflux    Adenomatous colon polyp    2012   Allergy    Anxiety    Constipation, chronic    Degenerative disc disease, cervical    s/p epidural injections   Depression    Fatty liver disease, nonalcoholic    per ultrasound   Fibromyalgia    Hypercholesterolemia    NSVD (normal spontaneous vaginal delivery)    X2   Prediabetes    a1c 6.1   Seizures ([place])    with dental procedure   Vitamin D deficiency     Past Surgical History:  Procedure Laterality  Date   BACK SURGERY  2008   BREAST SURGERY     REDUCTIVE MAMMOPLASTY   herniated disc     LAPAROSCOPIC CHOLECYSTECTOMY     LUMBAR DISC SURGERY     L-4 AND L-5    PELVIC LAPAROSCOPY     BTSP   TONSILLECTOMY AND ADENOIDECTOMY     VAGINAL HYSTERECTOMY     Post Repair/TVT Sling    Social History   Socioeconomic History   Marital status: Married    Spouse name: Not on file   Number of children: 2   Years of education: Not on file   Highest education level: Not on file  Occupational History    Employer: [employer]   Tobacco Use   Smoking status: Never   Smokeless tobacco: Never  Vaping Use   Vaping Use: Never used  Substance and Sexual Activity   Alcohol use: No   Drug use: No   Sexual activity: Not Currently    Partners: Male    Comment: 1st intercourse- 73, partners- 2, married- 29 yrs   Other Topics Concern   Not on file  Social History Narrative   Not on file   Social Determinants of Health   Financial Resource Strain: Not on file  Food Insecurity: Not on file  Transportation Needs: Not on file  Physical Activity: Not on file  Stress: Not on file  Social Connections: Not on file  Intimate Partner Violence: Not on file    Family History  Problem Relation Age of Onset   Hypertension Sister    Cancer Sister        colon ca... stepsister   Colon cancer Sister    Esophageal cancer Neg Hx    Stomach cancer Neg Hx    Rectal cancer Neg Hx      Review of Systems  Constitutional: Negative.  Negative for chills and fever.  HENT: Negative.    Respiratory: Negative.  Negative for cough and shortness of breath.   Cardiovascular: Negative.  Negative for chest pain and palpitations.  Gastrointestinal:  Negative for abdominal pain, diarrhea, nausea and vomiting.  Genitourinary:  Positive for frequency and urgency. Negative for dysuria, flank pain and hematuria.  Skin: Negative.  Negative for rash.  Neurological: Negative.  Negative for dizziness and headaches.    Today's Vitals   08/03/22 0837  BP: 120/78  Pulse: 91  Temp: 98.1 F (36.7 C)  TempSrc: Oral  SpO2: 98%  Weight: 168 lb 8 oz (76.4 kg)  Height: 5' 1"$  (1.549 m)   Body mass index is 31.84 kg/m. Wt Readings from Last 3 Encounters:  08/03/22 168 lb 8 oz (76.4 kg)  03/03/22 168 lb 2 oz (76.3 kg)  10/20/21 173 lb (78.5 kg)    Physical Exam Vitals reviewed.  Constitutional:      Appearance: Normal appearance.  HENT:     Head: Normocephalic.     Mouth/Throat:     Mouth: Mucous membranes are moist.     Pharynx:  Oropharynx is clear.  Eyes:     Extraocular Movements: Extraocular movements intact.     Pupils: Pupils are equal, round, and reactive to light.  Cardiovascular:     Rate and Rhythm: Normal rate and regular rhythm.     Pulses: Normal pulses.     Heart sounds: Normal heart sounds.  Pulmonary:     Effort: Pulmonary effort is normal.     Breath sounds: Normal breath sounds.  Abdominal:  General: There is no distension.     Palpations: Abdomen is soft.     Tenderness: There is no abdominal tenderness.  Musculoskeletal:     Cervical back: No tenderness.  Lymphadenopathy:     Cervical: No cervical adenopathy.  Skin:    General: Skin is warm and dry.  Neurological:     General: No focal deficit present.     Mental Status: She is alert and oriented to person, place, and time.  Psychiatric:        Mood and Affect: Mood normal.        Behavior: Behavior normal.    Results for orders placed or performed in visit on 08/03/22 (from the past 24 hour(s))  POCT Urinalysis Dipstick     Status: Abnormal   Collection Time: 08/03/22  8:50 AM  Result Value Ref Range   Color, UA yellow    Clarity, UA clear    Glucose, UA Negative Negative   Bilirubin, UA negative    Ketones, UA negative    Spec Grav, UA 1.015 1.010 - 1.025   Blood, UA trace    pH, UA 6.5 5.0 - 8.0   Protein, UA Negative Negative   Urobilinogen, UA 0.2 0.2 or 1.0 E.U./dL   Nitrite, UA negative    Leukocytes, UA Negative Negative   Appearance     Odor       ASSESSMENT & PLAN: A total of 47 minutes was spent with the patient and counseling/coordination of care regarding preparing for this visit, review of most recent office visit notes, review of multiple chronic medical conditions under management, review of all medications, review of upper and lower endoscopy reports from 2022, review of health maintenance items, review of most recent blood work results, diagnosis of overactive bladder and management options, review of  urinalysis, need for urology evaluation, education on nutrition, cardiovascular risks associated with dyslipidemia, prognosis, documentation, and need for follow-up.  Problem List Items Addressed This Visit       Digestive   Hepatic steatosis    Chronic stable condition. Diet and nutrition discussed. CMP done today.      Gastroesophageal reflux disease with esophagitis without hemorrhage    Stable and asymptomatic.  Takes Protonix 40 mg twice a day as needed Upper endoscopy report from 2022 reviewed.  Positive GERD.        Genitourinary   Overactive bladder    Creating lower urinary tract symptoms affecting quality of life. Recommend starting Gemtesa 75 mg daily Needs urology evaluation.  Referral placed today.      Relevant Medications   Vibegron (GEMTESA) 75 MG TABS   Other Relevant Orders   Ambulatory referral to Urology   CBC with Differential/Platelet   Comprehensive metabolic panel   Hemoglobin A1c   Lipid panel     Other   Dyslipidemia    Cardiovascular risks associated with dyslipidemia discussed.  Diet and nutrition discussed.  Chronic stable condition.  Continue atorvastatin 40 mg daily. The 10-year ASCVD risk score (Arnett DK, et al., 2019) is: 1.7%   Values used to calculate the score:     Age: 70 years     Sex: Female     Is Non-Hispanic African American: No     Diabetic: No     Tobacco smoker: No     Systolic Blood Pressure: 123456 mmHg     Is BP treated: No     HDL Cholesterol: 53.4 mg/dL     Total Cholesterol: 157 mg/dL  Prediabetes    Diet and nutrition discussed.  Continue metformin 500 mg twice a day Hemoglobin A1c done today.      Pain, chronic due to trauma    Chronic pain syndrome.  Follows up with pain management clinic on a regular basis.  On Lyrica and oxycodone daily.  Medications handled by pain management clinic.      Chronic pain syndrome    Stable.  On Lyrica and oxycodone.      Lower urinary tract symptoms - Primary     Normal urinalysis.  Sent for culture. No signs of infection. Most likely secondary to overactive bladder.  Recommend starting Gemtesa 75 mg daily.      Relevant Medications   Vibegron (GEMTESA) 75 MG TABS   Other Relevant Orders   POCT Urinalysis Dipstick (Completed)   Urine Culture   Ambulatory referral to Urology   CBC with Differential/Platelet   Comprehensive metabolic panel   Hemoglobin A1c   Lipid panel   Patient Instructions  Vejiga hiperactiva en adultos Overactive Bladder, Adult  Vejiga hiperactiva es una afeccin en la que la persona tiene una necesidad sbita y frecuente de Garment/textile technologist. La persona tambin podra tener una prdida de orina si no puede llegar al bao con la rapidez suficiente (incontinencia urinaria). A veces, los sntomas pueden interferir en el trabajo o las actividades sociales. Cules son las causas? La vejiga hiperactiva est asociada con seales nerviosas deficientes entre la vejiga y Nurse, mental health. La vejiga puede recibir la seal de vaciarse antes de que est llena. Usted tambin puede tener msculos muy sensibles que hacen que la vejiga se contraiga demasiado pronto. Esta afeccin tambin puede deberse a otros factores, como los siguientes: Afecciones mdicas: Infeccin de las vas urinarias. Infeccin de los tejidos cercanos. Agrandamiento de la prstata. Clculos en la vejiga, inflamacin o tumores. Diabetes. Debilidad de los msculos o nervios, especialmente a causa de estas afecciones: Lesin en la mdula espinal. Accidente cerebrovascular. Esclerosis mltiple. Enfermedad de Parkinson. Otras causas: Ciruga en el tero o la uretra. Consumir cafena o alcohol en exceso. Ciertos medicamentos, especialmente aquellos que eliminan el exceso de lquido del cuerpo (diurticos). Estreimiento. Qu incrementa el riesgo? Puede correr un mayor riesgo de desarrollar vejiga hiperactiva si usted: Es Media planner. Fuma. Est atravesando la  menopausia. Tiene problemas de prstata. Tiene una enfermedad neurolgica, como accidente cerebrovascular, demencia, enfermedad de Parkinson o esclerosis mltiple (EM). Come o toma alcohol, alimentos picantes, cafena y otras cosas que irritan la vejiga. Tiene sobrepeso u obesidad. Cules son los signos o sntomas? Los sntomas de esta afeccin incluyen una necesidad repentina y fuerte de Garment/textile technologist. Otros sntomas pueden incluir los siguientes: Prdida de [place]. Orina 8 o ms veces por da. Levantarse para orinar 2 o ms veces durante la noche. Cmo se diagnostica? Esta afeccin se puede diagnosticar en funcin de lo siguiente: Los sntomas y los antecedentes mdicos. Un examen fsico. Anlisis de sangre o de orina para detectar posibles causas, como una infeccin. Es posible que tambin tenga que Teacher, adult education a un mdico especialista en problemas de las vas [place]. Este mdico es Heritage manager. Cmo se trata? El tratamiento para el trastorno de vejiga hiperactiva depende de la causa y la gravedad de su enfermedad. El tratamiento puede incluir: Entrenamiento de la vejiga, por ejemplo: Aprender a controlar la necesidad urgente de Garment/textile technologist siguiendo un programa para orinar en intervalos regulares. Hacer ejercicios de Kegel para fortalecer los msculos del piso plvico que sostienen la vejiga. Dispositivos especiales, por ejemplo: Biorretroalimentacin.  Utiliza sensores para ayudarlo a Personnel officer atento a las seales del cuerpo. Estimulacin elctrica. Utiliza electrodos que se colocan dentro del cuerpo (implantados) o fuera del cuerpo. Estos electrodos envan pulsos elctricos suaves para fortalecer los nervios o los msculos que controlan la vejiga. Las mujeres pueden utilizar un dispositivo de plstico, llamado pesario, que calza en la vagina y sostiene la vejiga. Medicamentos, como por ejemplo: Antibiticos para tratar las infecciones en la vejiga. Antiespasmdicos para evitar que la vejiga  elimine orina en el momento incorrecto. Antidepresivos tricclicos para relajar los msculos de la vejiga. Inyecciones de toxina botulnica tipo A directamente en el tejido de la vejiga para relajar los msculos de la vejiga. Ciruga, como: Puede implantarse un dispositivo para ayudar a Chief Technology Officer las seales nerviosas que controlan la miccin. Puede implantarse un electrodo para estimular las seales elctricas en la vejiga. Puede realizarse un procedimiento para cambiar la forma de la vejiga. Esto solo se Merchandiser, retail graves. Siga estas instrucciones en su casa: Comida y bebida  Haga cambios en su estilo de vida o dieta segn las recomendaciones de su mdico. Estos pueden incluir: Electronics engineer lquidos a lo [place] y no solo con las comidas. Reducir la ingesta de cafena o alcohol. Ingerir una dieta saludable y equilibrada para English as a second language teacher estreimiento. Esto puede incluir: Elegir alimentos ricos en fibra, como frijoles, cereales integrales y frutas y verduras frescas. Limitar el consumo de alimentos ricos en grasas y azcares procesados, como alimentos fritos o dulces. Estilo de vida  Baje de [place], si es necesario. No consuma ningn producto que contenga nicotina o tabaco. Estos incluyen cigarrillos, tabaco para mascar y aparatos de vapeo, como los Psychologist, sport and exercise. Si necesita ayuda para dejar de consumir estos productos, consulte al mdico. Indicaciones generales Use los medicamentos de venta libre y los recetados solamente como se lo haya indicado el mdico. Si le recetaron un antibitico, tmelo como se lo haya indicado el mdico. No deje de tomar el antibitico aunque comience a sentirse mejor. Use los implantes o el pesario como se lo haya indicado el mdico. Si es necesario, use apsitos para Tax adviser cualquier prdida de orina que pueda [place]. Lleve un diario para registrar la cantidad de lquidos que ingiere y cundo lo hace, y cundo Mining engineer. Esto ayudar a  su mdico a Retail buyer. Cumpla con todas las visitas de seguimiento. Esto es importante. Comunquese con un mdico si: Tiene fiebre o escalofros. Los sntomas no mejoran con Dispensing optician. El dolor y [place]. Tiene necesidad urgente de orinar con mayor frecuencia. Solicite ayuda de inmediato si: No puede controlar la vejiga. Resumen Vejiga hiperactiva se refiere a Engineering geologist en la que la persona tiene una necesidad sbita y frecuente de Garment/textile technologist. Varias afecciones pueden causar sntomas de vejiga hiperactiva. El tratamiento para la vejiga hiperactiva depende de la causa y la gravedad de la afeccin. Hacer cambios en el estilo de vida, hacer ejercicios de Kegel, llevar un diario y tomar medicamentos puede ayudar con esta afeccin. Esta informacin no tiene Marine scientist el consejo del mdico. Asegrese de hacerle al mdico cualquier pregunta que tenga. Document Revised: 04/04/2020 Document Reviewed: 03/11/2020 Elsevier Patient Education  2023 [place], MD [facility]

## 2022-08-03 NOTE — Assessment & Plan Note (Signed)
Normal urinalysis.  Sent for culture. No signs of infection. Most likely secondary to overactive bladder.  Recommend starting Gemtesa 75 mg daily.

## 2022-08-03 NOTE — Assessment & Plan Note (Signed)
Stable.  On Lyrica and oxycodone.

## 2022-08-03 NOTE — Assessment & Plan Note (Signed)
Chronic pain syndrome.  Follows up with pain management clinic on a regular basis.  On Lyrica and oxycodone daily.  Medications handled by pain management clinic.

## 2022-08-03 NOTE — Assessment & Plan Note (Signed)
Cardiovascular risks associated with dyslipidemia discussed.  Diet and nutrition discussed.  Chronic stable condition.  Continue atorvastatin 40 mg daily. The 10-year ASCVD risk score (Arnett DK, et al., 2019) is: 1.7%   Values used to calculate the score:     Age: 57 years     Sex: Female     Is Non-Hispanic African American: No     Diabetic: No     Tobacco smoker: No     Systolic Blood Pressure: 123456 mmHg     Is BP treated: No     HDL Cholesterol: 53.4 mg/dL     Total Cholesterol: 157 mg/dL

## 2022-08-03 NOTE — Assessment & Plan Note (Signed)
Chronic stable condition. Diet and nutrition discussed. CMP done today.

## 2022-08-03 NOTE — Assessment & Plan Note (Signed)
Creating lower urinary tract symptoms affecting quality of life. Recommend starting Gemtesa 75 mg daily Needs urology evaluation.  Referral placed today.

## 2022-08-04 LAB — URINE CULTURE

## 2022-08-19 ENCOUNTER — Encounter: Payer: Self-pay | Admitting: Emergency Medicine

## 2022-08-21 DIAGNOSIS — Z0100 Encounter for examination of eyes and vision without abnormal findings: Secondary | ICD-10-CM | POA: Diagnosis not present

## 2022-08-25 ENCOUNTER — Encounter: Payer: Self-pay | Admitting: Obstetrics & Gynecology

## 2022-08-25 ENCOUNTER — Ambulatory Visit (INDEPENDENT_AMBULATORY_CARE_PROVIDER_SITE_OTHER): Payer: Medicare HMO | Admitting: Obstetrics & Gynecology

## 2022-08-25 ENCOUNTER — Telehealth: Payer: Self-pay

## 2022-08-25 VITALS — BP 118/70 | HR 96 | Ht 59.25 in | Wt 168.0 lb

## 2022-08-25 DIAGNOSIS — Z9189 Other specified personal risk factors, not elsewhere classified: Secondary | ICD-10-CM | POA: Diagnosis not present

## 2022-08-25 DIAGNOSIS — Z90710 Acquired absence of both cervix and uterus: Secondary | ICD-10-CM

## 2022-08-25 DIAGNOSIS — Z780 Asymptomatic menopausal state: Secondary | ICD-10-CM

## 2022-08-25 DIAGNOSIS — B372 Candidiasis of skin and nail: Secondary | ICD-10-CM

## 2022-08-25 DIAGNOSIS — M85851 Other specified disorders of bone density and structure, right thigh: Secondary | ICD-10-CM

## 2022-08-25 DIAGNOSIS — Z01419 Encounter for gynecological examination (general) (routine) without abnormal findings: Secondary | ICD-10-CM

## 2022-08-25 MED ORDER — NYSTATIN 100000 UNIT/GM EX POWD: 1.0000 | Freq: Two times a day (BID) | CUTANEOUS | 4 refills | Status: AC

## 2022-08-25 NOTE — Telephone Encounter (Signed)
Pt made aware

## 2022-08-25 NOTE — Telephone Encounter (Addendum)
Prior authorization done for nystatin powder that was sent in for patient today. Key: [id number] Sent to plan. Waiting on reply. It could take 3-7 days for response from [facility]. Routing to [place] to call & notify patient

## 2022-08-25 NOTE — Progress Notes (Signed)
[name] 29-Aug-1965 ID:[id number]   History:    57 y.o. G2P2L2  Married.  Oldest is 57 yo.  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   RP:  Established patient presenting for annual gyn exam    HPI: S/P TVH in 2005 for fibroids normal Pap history.  Pap Neg 07/2019.  Repeat at 5 years.  No pelvic pain.  C/O itching under the panniculus.  2008 breast reduction normal mammogram history.  Mammogram Neg 07/2022. Colono 02/2021.  Biggest problem is degenerative disc disease has had 3 surgeries and has continued chronic back pain is now on disability. Also has fibromyalgia.  Husband prostate cancer/ not sexually active.  Primary care manages diabetes and hypercholesterolemia.  BMI 33.65. BD very mild osteopenia T-Score -1.1 at Rt Fem Neck in 08/2019, will repeat at 5 years.  Past medical history,surgical history, family history and social history were all reviewed and documented in the EPIC chart.  Gynecologic History No LMP recorded. Patient has had a hysterectomy.  Obstetric History OB History  Gravida Para Term Preterm AB Living  '2 2 2   '$ 0 2  SAB IAB Ectopic Multiple Live Births      0   2    # Outcome Date GA Lbr Len/2nd Weight Sex Delivery Anes PTL Lv  2 Term     M Vag-Spont  N LIV  1 Term     F Vag-Spont  N LIV     ROS: A ROS was performed and pertinent positives and negatives are included in the history. GENERAL: No fevers or chills. HEENT: No change in vision, no earache, sore throat or sinus congestion. NECK: No pain or stiffness. CARDIOVASCULAR: No chest pain or pressure. No palpitations. PULMONARY: No shortness of breath, cough or wheeze. GASTROINTESTINAL: No abdominal pain, nausea, vomiting or diarrhea, melena or bright red blood per rectum. GENITOURINARY: No urinary frequency, urgency, hesitancy or dysuria. MUSCULOSKELETAL: No joint or muscle pain, no back pain, no recent trauma. DERMATOLOGIC: No rash, no itching, no lesions. ENDOCRINE: No polyuria, polydipsia, no  heat or cold intolerance. No recent change in weight. HEMATOLOGICAL: No anemia or easy bruising or bleeding. NEUROLOGIC: No headache, seizures, numbness, tingling or weakness. PSYCHIATRIC: No depression, no loss of interest in normal activity or change in sleep pattern.     Exam:   BP 118/70   Pulse 96   Ht 4' 11.25" (1.505 m)   Wt 168 lb (76.2 kg)   SpO2 99%   BMI 33.65 kg/m   Body mass index is 33.65 kg/m.  General appearance : Well developed well nourished female. No acute distress HEENT: Eyes: no retinal hemorrhage or exudates,  Neck supple, trachea midline, no carotid bruits, no thyroidmegaly Lungs: Clear to auscultation, no rhonchi or wheezes, or rib retractions  Heart: Regular rate and rhythm, no murmurs or gallops Breast:Examined in sitting and supine position were symmetrical in appearance, no palpable masses or tenderness,  no skin retraction, no nipple inversion, no nipple discharge, no skin discoloration, no axillary or supraclavicular lymphadenopathy Abdomen: no palpable masses or tenderness, no rebound or guarding.  Fungal infection under the panniculus. Extremities: no edema or skin discoloration or tenderness  Pelvic: Vulva: Normal             Vagina: No gross lesions or discharge  Cervix/Uterus absent  Anus: Normal   Assessment/Plan:  57 y.o. female for annual exam   1. Well female exam with routine gynecological exam S/P TVH in 2005 for fibroids  normal Pap history.  Pap Neg 07/2019.  Repeat at 5 years.  No pelvic pain.  C/O itching under the panniculus.  2008 breast reduction normal mammogram history.  Mammogram Neg 07/2022. Colono 02/2021.  Biggest problem is degenerative disc disease has had 3 surgeries and has continued chronic back pain is now on disability. Also has fibromyalgia.  Husband prostate cancer/ not sexually active.  Primary care manages diabetes and hypercholesterolemia.  BMI 33.65. BD very mild osteopenia T-Score -1.1 at Rt Fem Neck in 08/2019, will  repeat at 5 years.  2. S/P hysterectomy  3. Postmenopause Postmenopause, well on no HRT.  Abstinent.  4. Osteopenia of neck of right femur BD very mild osteopenia T-Score -1.1 at Rt Fem Neck in 08/2019, will repeat at 5 years.  5. Yeast infection of the skin C/O itching under the panniculus. Will treat with Nystatin powder as needed.  5. Other specified personal risk factors, not elsewhere classified  Other orders - oxyCODONE-acetaminophen (PERCOCET) 10-325 MG tablet; Take 1 tablet by mouth every 8 (eight) hours as needed. - nystatin (MYCOSTATIN/NYSTOP) powder; Apply 1 Application topically 2 (two) times daily.   Princess Bruins MD, 9:13 AM

## 2022-08-27 NOTE — Telephone Encounter (Signed)
Prior authorization for medication was approved. Routing to [place] to notify patient.

## 2022-08-27 NOTE — Telephone Encounter (Signed)
Outbound call to patient and made aware of approval.

## 2022-09-06 DIAGNOSIS — M961 Postlaminectomy syndrome, not elsewhere classified: Secondary | ICD-10-CM | POA: Diagnosis not present

## 2022-09-06 DIAGNOSIS — F1120 Opioid dependence, uncomplicated: Secondary | ICD-10-CM | POA: Diagnosis not present

## 2022-09-06 DIAGNOSIS — E119 Type 2 diabetes mellitus without complications: Secondary | ICD-10-CM | POA: Diagnosis not present

## 2022-09-06 DIAGNOSIS — M4722 Other spondylosis with radiculopathy, cervical region: Secondary | ICD-10-CM | POA: Diagnosis not present

## 2022-09-06 DIAGNOSIS — M47816 Spondylosis without myelopathy or radiculopathy, lumbar region: Secondary | ICD-10-CM | POA: Diagnosis not present

## 2022-09-28 DIAGNOSIS — Z6833 Body mass index (BMI) 33.0-33.9, adult: Secondary | ICD-10-CM | POA: Diagnosis not present

## 2022-09-28 DIAGNOSIS — L940 Localized scleroderma [morphea]: Secondary | ICD-10-CM | POA: Diagnosis not present

## 2022-09-28 DIAGNOSIS — M5136 Other intervertebral disc degeneration, lumbar region: Secondary | ICD-10-CM | POA: Diagnosis not present

## 2022-09-28 DIAGNOSIS — M797 Fibromyalgia: Secondary | ICD-10-CM | POA: Diagnosis not present

## 2022-09-28 DIAGNOSIS — M5030 Other cervical disc degeneration, unspecified cervical region: Secondary | ICD-10-CM | POA: Diagnosis not present

## 2022-09-28 DIAGNOSIS — E669 Obesity, unspecified: Secondary | ICD-10-CM | POA: Diagnosis not present

## 2022-10-13 ENCOUNTER — Other Ambulatory Visit: Payer: Self-pay | Admitting: Emergency Medicine

## 2022-12-07 DIAGNOSIS — F1120 Opioid dependence, uncomplicated: Secondary | ICD-10-CM | POA: Diagnosis not present

## 2022-12-07 DIAGNOSIS — M5417 Radiculopathy, lumbosacral region: Secondary | ICD-10-CM | POA: Diagnosis not present

## 2022-12-07 DIAGNOSIS — M47816 Spondylosis without myelopathy or radiculopathy, lumbar region: Secondary | ICD-10-CM | POA: Diagnosis not present

## 2022-12-07 DIAGNOSIS — M961 Postlaminectomy syndrome, not elsewhere classified: Secondary | ICD-10-CM | POA: Diagnosis not present

## 2022-12-07 DIAGNOSIS — M5412 Radiculopathy, cervical region: Secondary | ICD-10-CM | POA: Diagnosis not present

## 2022-12-07 DIAGNOSIS — M4722 Other spondylosis with radiculopathy, cervical region: Secondary | ICD-10-CM | POA: Diagnosis not present

## 2022-12-10 ENCOUNTER — Encounter: Payer: Self-pay | Admitting: Emergency Medicine

## 2022-12-20 ENCOUNTER — Encounter: Payer: Self-pay | Admitting: Emergency Medicine

## 2022-12-20 ENCOUNTER — Other Ambulatory Visit: Payer: Self-pay | Admitting: Emergency Medicine

## 2022-12-20 ENCOUNTER — Other Ambulatory Visit: Payer: Self-pay | Admitting: Gastroenterology

## 2022-12-20 DIAGNOSIS — K2100 Gastro-esophageal reflux disease with esophagitis, without bleeding: Secondary | ICD-10-CM

## 2022-12-20 MED ORDER — PANTOPRAZOLE SODIUM 40 MG PO TBEC: 40.0000 mg | DELAYED_RELEASE_TABLET | Freq: Every day | ORAL | 3 refills | Status: DC

## 2022-12-20 NOTE — Telephone Encounter (Signed)
New prescription sent to pharmacy of record today.  Thanks.

## 2022-12-24 ENCOUNTER — Ambulatory Visit (INDEPENDENT_AMBULATORY_CARE_PROVIDER_SITE_OTHER): Payer: Medicare HMO | Admitting: Obstetrics & Gynecology

## 2022-12-24 ENCOUNTER — Encounter: Payer: Self-pay | Admitting: Obstetrics & Gynecology

## 2022-12-24 VITALS — BP 120/80 | HR 96 | Temp 98.1°F

## 2022-12-24 DIAGNOSIS — R350 Frequency of micturition: Secondary | ICD-10-CM

## 2022-12-24 DIAGNOSIS — N958 Other specified menopausal and perimenopausal disorders: Secondary | ICD-10-CM | POA: Diagnosis not present

## 2022-12-24 DIAGNOSIS — N9089 Other specified noninflammatory disorders of vulva and perineum: Secondary | ICD-10-CM | POA: Diagnosis not present

## 2022-12-24 LAB — URINALYSIS, COMPLETE W/RFL CULTURE
Bacteria, UA: NONE SEEN /HPF
Bilirubin Urine: NEGATIVE
Glucose, UA: NEGATIVE
Hyaline Cast: NONE SEEN /LPF
Ketones, ur: NEGATIVE
Leukocyte Esterase: NEGATIVE
Nitrites, Initial: NEGATIVE
Protein, ur: NEGATIVE
RBC / HPF: NONE SEEN /HPF (ref 0–2)
Specific Gravity, Urine: 1.003 (ref 1.001–1.035)
WBC, UA: NONE SEEN /HPF (ref 0–5)
pH: 5.5 (ref 5.0–8.0)

## 2022-12-24 LAB — NO CULTURE INDICATED

## 2022-12-24 LAB — WET PREP FOR TRICH, YEAST, CLUE

## 2022-12-24 MED ORDER — NYSTATIN-TRIAMCINOLONE 100000-0.1 UNIT/GM-% EX OINT: 1.0000 | TOPICAL_OINTMENT | Freq: Every day | CUTANEOUS | 1 refills | Status: AC

## 2022-12-24 NOTE — Progress Notes (Signed)
    [name] [birth year] [id number]        57 y.o.  [id number]   RP: Vulvar irritation and urinary frequency  HPI: Pt c/o vulvar irritation and itching.  Also c/o burning and urinary frequency.  Wonders if her bladder is descending, but no bulging and no vaginal pain. No fever.  No pelvic pain. No PMB. S/P Total Hysterectomy.  OB History  Gravida Para Term Preterm AB Living  2 2 2    0 2  SAB IAB Ectopic Multiple Live Births      0   2    # Outcome Date GA Lbr Len/2nd Weight Sex Type Anes PTL Lv  2 Term     M Vag-Spont  N LIV  1 Term     F Vag-Spont  N LIV    Past medical history,surgical history, problem list, medications, allergies, family history and social history were all reviewed and documented in the EPIC chart.   Directed ROS with pertinent positives and negatives documented in the history of present illness/assessment and plan.  Exam:  Vitals:   12/24/22 1337  BP: 120/80  Pulse: 96  SpO2: 98%   General appearance:  Normal  Abdomen: Normal  Gynecologic exam: Vulva normal with no erythema. Speculum: Vagina normal.  No discharge.  Wet prep done.  Bimanual exam:  No pelvic mass, NT.   Vaginal exam in standing position with Valsava:  No colpocele, no cystocele or rectocele.  U/A: Completely Negative Wet prep: Negative   Assessment/Plan:  57 y.o. G2P2002   1. Vulvar irritation Wet prep Negative.  Will try Mycolog daily at bedtime x 2 weeks.  Usage reviewed, prescription sent to pharmacy. - WET PREP FOR TRICH, YEAST, CLUE  2. Urinary frequency U/A Negative.  Reassured. - Urinalysis,Complete w/RFL Culture  3. Genitourinary syndrome of menopause  Vulvar irritation and discomfort probably associated with Genitourinary syndrome of menopause.  Will try Replens.  Usage reviewed.  Other orders - nystatin-triamcinolone ointment (MYCOLOG); Apply 1 Application topically at bedtime for 14 days.   Genia Del MD, 1:42 PM 12/24/2022

## 2022-12-27 ENCOUNTER — Encounter: Payer: Self-pay | Admitting: Obstetrics & Gynecology

## 2023-03-25 DIAGNOSIS — M47816 Spondylosis without myelopathy or radiculopathy, lumbar region: Secondary | ICD-10-CM | POA: Diagnosis not present

## 2023-03-25 DIAGNOSIS — M4722 Other spondylosis with radiculopathy, cervical region: Secondary | ICD-10-CM | POA: Diagnosis not present

## 2023-03-25 DIAGNOSIS — M961 Postlaminectomy syndrome, not elsewhere classified: Secondary | ICD-10-CM | POA: Diagnosis not present

## 2023-03-25 DIAGNOSIS — M5412 Radiculopathy, cervical region: Secondary | ICD-10-CM | POA: Diagnosis not present

## 2023-04-01 DIAGNOSIS — M5412 Radiculopathy, cervical region: Secondary | ICD-10-CM | POA: Diagnosis not present

## 2023-04-07 ENCOUNTER — Encounter: Payer: Self-pay | Admitting: Emergency Medicine

## 2023-04-07 NOTE — Telephone Encounter (Signed)
Called patient and informed her that an appointment is needed. She states she will keep her appt for Tuesday

## 2023-04-11 ENCOUNTER — Other Ambulatory Visit: Payer: Self-pay | Admitting: Emergency Medicine

## 2023-04-12 ENCOUNTER — Ambulatory Visit: Payer: Medicare HMO | Admitting: Emergency Medicine

## 2023-04-18 ENCOUNTER — Ambulatory Visit: Payer: Medicare HMO | Admitting: Emergency Medicine

## 2023-05-09 ENCOUNTER — Other Ambulatory Visit: Payer: Self-pay | Admitting: Emergency Medicine

## 2023-05-09 DIAGNOSIS — E785 Hyperlipidemia, unspecified: Secondary | ICD-10-CM

## 2023-06-03 DIAGNOSIS — M961 Postlaminectomy syndrome, not elsewhere classified: Secondary | ICD-10-CM | POA: Diagnosis not present

## 2023-06-03 DIAGNOSIS — G5603 Carpal tunnel syndrome, bilateral upper limbs: Secondary | ICD-10-CM | POA: Diagnosis not present

## 2023-06-03 DIAGNOSIS — M5412 Radiculopathy, cervical region: Secondary | ICD-10-CM | POA: Diagnosis not present

## 2023-06-03 DIAGNOSIS — M47816 Spondylosis without myelopathy or radiculopathy, lumbar region: Secondary | ICD-10-CM | POA: Diagnosis not present

## 2023-06-03 DIAGNOSIS — M4722 Other spondylosis with radiculopathy, cervical region: Secondary | ICD-10-CM | POA: Diagnosis not present

## 2023-07-08 DIAGNOSIS — G5601 Carpal tunnel syndrome, right upper limb: Secondary | ICD-10-CM | POA: Diagnosis not present

## 2023-07-08 DIAGNOSIS — M4802 Spinal stenosis, cervical region: Secondary | ICD-10-CM | POA: Diagnosis not present

## 2023-07-08 DIAGNOSIS — M4602 Spinal enthesopathy, cervical region: Secondary | ICD-10-CM | POA: Diagnosis not present

## 2023-08-31 DIAGNOSIS — M47816 Spondylosis without myelopathy or radiculopathy, lumbar region: Secondary | ICD-10-CM | POA: Diagnosis not present

## 2023-08-31 DIAGNOSIS — M5412 Radiculopathy, cervical region: Secondary | ICD-10-CM | POA: Diagnosis not present

## 2023-08-31 DIAGNOSIS — M961 Postlaminectomy syndrome, not elsewhere classified: Secondary | ICD-10-CM | POA: Diagnosis not present

## 2023-09-07 DIAGNOSIS — M961 Postlaminectomy syndrome, not elsewhere classified: Secondary | ICD-10-CM | POA: Diagnosis not present

## 2023-09-07 DIAGNOSIS — G894 Chronic pain syndrome: Secondary | ICD-10-CM | POA: Diagnosis not present

## 2023-10-08 DIAGNOSIS — R7303 Prediabetes: Secondary | ICD-10-CM | POA: Diagnosis not present

## 2023-10-08 DIAGNOSIS — H524 Presbyopia: Secondary | ICD-10-CM | POA: Diagnosis not present

## 2023-11-30 ENCOUNTER — Ambulatory Visit (INDEPENDENT_AMBULATORY_CARE_PROVIDER_SITE_OTHER): Admitting: Emergency Medicine

## 2023-11-30 ENCOUNTER — Ambulatory Visit (INDEPENDENT_AMBULATORY_CARE_PROVIDER_SITE_OTHER)

## 2023-11-30 ENCOUNTER — Ambulatory Visit: Payer: Self-pay | Admitting: Emergency Medicine

## 2023-11-30 ENCOUNTER — Encounter: Payer: Self-pay | Admitting: Emergency Medicine

## 2023-11-30 VITALS — BP 118/72 | HR 86 | Temp 98.4°F | Ht 61.0 in | Wt 163.0 lb

## 2023-11-30 VITALS — BP 132/82 | HR 94 | Ht 61.0 in | Wt 165.0 lb

## 2023-11-30 DIAGNOSIS — R7303 Prediabetes: Secondary | ICD-10-CM | POA: Diagnosis not present

## 2023-11-30 DIAGNOSIS — M5417 Radiculopathy, lumbosacral region: Secondary | ICD-10-CM | POA: Diagnosis not present

## 2023-11-30 DIAGNOSIS — Z1329 Encounter for screening for other suspected endocrine disorder: Secondary | ICD-10-CM

## 2023-11-30 DIAGNOSIS — I700 Atherosclerosis of aorta: Secondary | ICD-10-CM

## 2023-11-30 DIAGNOSIS — Z5912 Inadequate housing utilities: Secondary | ICD-10-CM

## 2023-11-30 DIAGNOSIS — Z13228 Encounter for screening for other metabolic disorders: Secondary | ICD-10-CM

## 2023-11-30 DIAGNOSIS — Z124 Encounter for screening for malignant neoplasm of cervix: Secondary | ICD-10-CM | POA: Diagnosis not present

## 2023-11-30 DIAGNOSIS — Z5986 Financial insecurity: Secondary | ICD-10-CM

## 2023-11-30 DIAGNOSIS — K760 Fatty (change of) liver, not elsewhere classified: Secondary | ICD-10-CM | POA: Diagnosis not present

## 2023-11-30 DIAGNOSIS — Z5941 Food insecurity: Secondary | ICD-10-CM

## 2023-11-30 DIAGNOSIS — K2100 Gastro-esophageal reflux disease with esophagitis, without bleeding: Secondary | ICD-10-CM

## 2023-11-30 DIAGNOSIS — N3281 Overactive bladder: Secondary | ICD-10-CM | POA: Diagnosis not present

## 2023-11-30 DIAGNOSIS — Z130 Encounter for screening for diseases of the blood and blood-forming organs and certain disorders involving the immune mechanism: Secondary | ICD-10-CM

## 2023-11-30 DIAGNOSIS — E785 Hyperlipidemia, unspecified: Secondary | ICD-10-CM | POA: Diagnosis not present

## 2023-11-30 DIAGNOSIS — Z0000 Encounter for general adult medical examination without abnormal findings: Secondary | ICD-10-CM

## 2023-11-30 DIAGNOSIS — Z0001 Encounter for general adult medical examination with abnormal findings: Secondary | ICD-10-CM | POA: Diagnosis not present

## 2023-11-30 DIAGNOSIS — G894 Chronic pain syndrome: Secondary | ICD-10-CM

## 2023-11-30 LAB — COMPREHENSIVE METABOLIC PANEL WITH GFR
ALT: 22 U/L (ref 0–35)
AST: 22 U/L (ref 0–37)
Albumin: 4.8 g/dL (ref 3.5–5.2)
Alkaline Phosphatase: 69 U/L (ref 39–117)
BUN: 9 mg/dL (ref 6–23)
CO2: 29 meq/L (ref 19–32)
Calcium: 9.8 mg/dL (ref 8.4–10.5)
Chloride: 103 meq/L (ref 96–112)
Creatinine, Ser: 0.48 mg/dL (ref 0.40–1.20)
GFR: 104.43 mL/min (ref >60.00)
Glucose, Bld: 86 mg/dL (ref 70–99)
Potassium: 4 meq/L (ref 3.5–5.1)
Sodium: 138 meq/L (ref 135–145)
Total Bilirubin: 0.7 mg/dL (ref 0.2–1.2)
Total Protein: 8.1 g/dL (ref 6.0–8.3)

## 2023-11-30 LAB — CBC WITH DIFFERENTIAL/PLATELET
Basophils Absolute: 0.1 10*3/uL (ref 0.0–0.1)
Basophils Relative: 0.6 % (ref 0.0–3.0)
Eosinophils Absolute: 0.1 10*3/uL (ref 0.0–0.7)
Eosinophils Relative: 1.6 % (ref 0.0–5.0)
HCT: 38.7 % (ref 36.0–46.0)
Hemoglobin: 12.9 g/dL (ref 12.0–15.0)
Lymphocytes Relative: 34.3 % (ref 12.0–46.0)
Lymphs Abs: 3 10*3/uL (ref 0.7–4.0)
MCHC: 33.5 g/dL (ref 30.0–36.0)
MCV: 89.3 fl (ref 78.0–100.0)
Monocytes Absolute: 0.8 10*3/uL (ref 0.1–1.0)
Monocytes Relative: 9.3 % (ref 3.0–12.0)
Neutro Abs: 4.8 10*3/uL (ref 1.4–7.7)
Neutrophils Relative %: 54.2 % (ref 43.0–77.0)
Platelets: 296 10*3/uL (ref 150.0–400.0)
RBC: 4.33 Mil/uL (ref 3.87–5.11)
RDW: 13 % (ref 11.5–15.5)
WBC: 8.8 10*3/uL (ref 4.0–10.5)

## 2023-11-30 LAB — LIPID PANEL
Cholesterol: 192 mg/dL (ref 0–200)
HDL: 49.8 mg/dL (ref >39.00)
LDL Cholesterol: 64 mg/dL (ref 0–99)
NonHDL: 142.28
Total CHOL/HDL Ratio: 4
Triglycerides: 391 mg/dL — ABNORMAL HIGH (ref 0.0–149.0)
VLDL: 78.2 mg/dL — ABNORMAL HIGH (ref 0.0–40.0)

## 2023-11-30 LAB — URINALYSIS, ROUTINE W REFLEX MICROSCOPIC
Bilirubin Urine: NEGATIVE
Hgb urine dipstick: NEGATIVE
Ketones, ur: NEGATIVE
Nitrite: NEGATIVE
RBC / HPF: NONE SEEN (ref 0–?)
Specific Gravity, Urine: <=1.005 — AB (ref 1.000–1.030)
Total Protein, Urine: NEGATIVE
Urine Glucose: NEGATIVE
Urobilinogen, UA: 0.2 (ref 0.0–1.0)
pH: 7 (ref 5.0–8.0)

## 2023-11-30 LAB — HEMOGLOBIN A1C: Hgb A1c MFr Bld: 6.5 % (ref 4.6–6.5)

## 2023-11-30 NOTE — Assessment & Plan Note (Signed)
Chronic.  Diet and nutrition discussed.  Continue atorvastatin 40 mg daily.

## 2023-11-30 NOTE — Assessment & Plan Note (Signed)
 Cardiovascular risks associated with dyslipidemia discussed. Diet and nutrition discussed. Chronic stable condition. Continue atorvastatin  40 mg daily.

## 2023-11-30 NOTE — Assessment & Plan Note (Addendum)
 Continues with intermittent episodes affecting quality of life Continues pantoprazole  40 mg daily as needed Diet and nutrition discussed

## 2023-11-30 NOTE — Assessment & Plan Note (Signed)
 Chronic intermittent pain affecting quality of life

## 2023-11-30 NOTE — Patient Instructions (Addendum)
 [name] , Thank you for taking time out of your busy schedule to complete your Annual Wellness Visit with me. I enjoyed our conversation and look forward to speaking with you again next year. I, as well as your care team,  appreciate your ongoing commitment to your health goals. Please review the following plan we discussed and let me know if I can assist you in the future. Your Game plan/ To Do List    Referrals: If you haven't heard from the office you've been referred to, please reach out to them at the phone provided.  Referral to Dr Orson Blalock (Gynecology) for a pap smear and [employer] for financial assistance for utilities/food Follow up Visits: Next Medicare AWV with our clinical staff: 12/05/2024   Have you seen your provider in the last 6 months (3 months if uncontrolled diabetes)? Yes Next Office Visit with your provider: 11/30/2023 - Physical  Clinician Recommendations:  Aim for 30 minutes of exercise or brisk walking, 6-8 glasses of water, and 5 servings of fruits and vegetables each day. Educated and advised on getting the 2nd Shingles vaccine.      This is a list of the screening recommended for you and due dates:  Health Maintenance  Topic Date Due   Pap Smear  08/07/2020   COVID-19 Vaccine (4 - 2024-25 season) 02/13/2023   Mammogram  08/03/2023   Zoster (Shingles) Vaccine (2 of 2) 03/01/2024*   Flu Shot  01/13/2024   Medicare Annual Wellness Visit  11/29/2024   DTaP/Tdap/Td vaccine (3 - Td or Tdap) 10/21/2025   Colon Cancer Screening  03/11/2031   Hepatitis C Screening  Completed   HIV Screening  Completed   HPV Vaccine  Aged Out   Meningitis B Vaccine  Aged Out  *Topic was postponed. The date shown is not the original due date.    Advanced directives: (Declined) Advance directive discussed with you today. Even though you declined this today, please call our office should you change your mind, and we can give you the proper paperwork for you to  fill out. Advance Care Planning is important because it:  [x]  Makes sure you receive the medical care that is consistent with your values, goals, and preferences  [x]  It provides guidance to your family and loved ones and reduces their decisional burden about whether or not they are making the right decisions based on your wishes.  Follow the link provided in your after visit summary or read over the paperwork we have mailed to you to help you started getting your Advance Directives in place. If you need assistance in completing these, please reach out to us  so that we can help you!   Managing Pain Without Opioids Opioids are strong medicines used to treat moderate to severe pain. For some people, especially those who have long-term (chronic) pain, opioids may not be the best choice for pain management due to: Side effects like nausea, constipation, and sleepiness. The risk of addiction (opioid use disorder). The longer you take opioids, the greater your risk of addiction. Pain that lasts for more than 3 months is called chronic pain. Managing chronic pain usually requires more than one approach and is often provided by a team of health care providers working together (multidisciplinary approach). Pain management may be done at a pain management center or pain clinic. How to manage pain without the use of opioids Use non-opioid medicines Non-opioid medicines for pain may include: Over-the-counter or prescription non-steroidal anti-inflammatory drugs (  NSAIDs). These may be the first medicines used for pain. They work well for muscle and bone pain, and they reduce swelling. Acetaminophen. This over-the-counter medicine may work well for milder pain but not swelling. Antidepressants. These may be used to treat chronic pain. A certain type of antidepressant (tricyclics) is often used. These medicines are given in lower doses for pain than when used for depression. Anticonvulsants. These are usually  used to treat seizures but may also reduce nerve (neuropathic) pain. Muscle relaxants. These relieve pain caused by sudden muscle tightening (spasms). You may also use a pain medicine that is applied to the skin as a patch, cream, or gel (topical analgesic), such as a numbing medicine. These may cause fewer side effects than medicines taken by mouth. Do certain therapies as directed Some therapies can help with pain management. They include: Physical therapy. You will do exercises to gain strength and flexibility. A physical therapist may teach you exercises to move and stretch parts of your body that are weak, stiff, or painful. You can learn these exercises at physical therapy visits and practice them at home. Physical therapy may also involve: Massage. Heat wraps or applying heat or cold to affected areas. Electrical signals that interrupt pain signals (transcutaneous electrical nerve stimulation, TENS). Weak lasers that reduce pain and swelling (low-level laser therapy). Signals from your body that help you learn to regulate pain (biofeedback). Occupational therapy. This helps you to learn ways to function at home and work with less pain. Recreational therapy. This involves trying new activities or hobbies, such as a physical activity or drawing. Mental health therapy, including: Cognitive behavioral therapy (CBT). This helps you learn coping skills for dealing with pain. Acceptance and commitment therapy (ACT) to change the way you think and react to pain. Relaxation therapies, including muscle relaxation exercises and mindfulness-based stress reduction. Pain management counseling. This may be individual, family, or group counseling.  Receive medical treatments Medical treatments for pain management include: Nerve block injections. These may include a pain blocker and anti-inflammatory medicines. You may have injections: Near the spine to relieve chronic back or neck pain. Into joints to  relieve back or joint pain. Into nerve areas that supply a painful area to relieve body pain. Into muscles (trigger point injections) to relieve some painful muscle conditions. A medical device placed near your spine to help block pain signals and relieve nerve pain or chronic back pain (spinal cord stimulation device). Acupuncture. Follow these instructions at home Medicines Take over-the-counter and prescription medicines only as told by your health care provider. If you are taking pain medicine, ask your health care providers about possible side effects to watch out for. Do not drive or use heavy machinery while taking prescription opioid pain medicine. Lifestyle  Do not use drugs or alcohol to reduce pain. If you drink alcohol, limit how much you have to: 0-1 drink a day for women who are not pregnant. 0-2 drinks a day for men. Know how much alcohol is in a drink. In the [place] one drink equals one 12 oz bottle of beer (355 mL), one 5 oz glass of wine (148 mL), or one 1 oz glass of hard liquor (44 mL). Do not use any products that contain nicotine or tobacco. These products include cigarettes, chewing tobacco, and vaping devices, such as e-cigarettes. If you need help quitting, ask your health care provider. Eat a healthy diet and maintain a healthy weight. Poor diet and excess weight may make pain worse. Eat foods  that are high in fiber. These include fresh fruits and vegetables, whole grains, and beans. Limit foods that are high in fat and processed sugars, such as fried and sweet foods. Exercise regularly. Exercise lowers stress and may help relieve pain. Ask your health care provider what activities and exercises are safe for you. If your health care provider approves, join an exercise class that combines movement and stress reduction. Examples include yoga and tai chi. Get enough sleep. Lack of sleep may make pain worse. Lower stress as much as possible. Practice stress reduction  techniques as told by your therapist. General instructions Work with all your pain management providers to find the treatments that work best for you. You are an important member of your pain management team. There are many things you can do to reduce pain on your own. Consider joining an online or in-person support group for people who have chronic pain. Keep all follow-up visits. This is important. Where to find more information You can find more information about managing pain without opioids from: American Academy of Pain Medicine: painmed.org Institute for Chronic Pain: instituteforchronicpain.org American Chronic Pain Association: theacpa.org Contact a health care provider if: You have side effects from pain medicine. Your pain gets worse or does not get better with treatments or home therapy. You are struggling with anxiety or depression. Summary Many types of pain can be managed without opioids. Chronic pain may respond better to pain management without opioids. Pain is best managed when you and a team of health care providers work together. Pain management without opioids may include non-opioid medicines, medical treatments, physical therapy, mental health therapy, and lifestyle changes. Tell your health care providers if your pain gets worse or is not being managed well enough. This information is not intended to replace advice given to you by your health care provider. Make sure you discuss any questions you have with your health care provider. Document Revised: 09/10/2020 Document Reviewed: 09/10/2020 Elsevier Patient Education  2024 [employer].

## 2023-11-30 NOTE — Assessment & Plan Note (Signed)
Diet and nutrition discussed.  Continue metformin 500 mg twice a day Hemoglobin A1c done today.

## 2023-11-30 NOTE — Assessment & Plan Note (Signed)
 Intermittent symptoms Continues Ditropan  5 mg at bedtime

## 2023-11-30 NOTE — Assessment & Plan Note (Signed)
 Diet and nutrition discussed. Continue atorvastatin 40 mg daily.

## 2023-11-30 NOTE — Assessment & Plan Note (Signed)
 Stable.  On Lyrica  and oxycodone . Goes to pain management clinic regularly Pain medications handled by their office

## 2023-11-30 NOTE — Patient Instructions (Signed)
 Mantenimiento de Radiographer, therapeutic en las mujeres Health Maintenance, Female Adoptar un estilo de vida saludable y recibir atencin preventiva son importantes para promover la salud y Counsellor. Consulte al mdico sobre: El esquema adecuado para hacerse pruebas y exmenes peridicos. Cosas que puede hacer por su cuenta para prevenir enfermedades y [place] sano. Qu debo saber sobre la dieta, el peso y el ejercicio? Consuma una dieta saludable  Consuma una dieta que incluya muchas verduras, frutas, productos lcteos con bajo contenido de [place] y Associate Professor. No consuma muchos alimentos ricos en grasas slidas, azcares agregados o sodio. Mantenga un peso saludable El ndice de masa muscular [facility]) se [place] para identificar problemas de [place]. Proporciona una estimacin de la grasa corporal basndose en el peso y la altura. Su mdico puede ayudarle a Engineer, site [facility] y a Personnel officer o Pharmacologist un peso saludable. Haga ejercicio con regularidad Haga ejercicio con regularidad. Esta es una de las prcticas ms importantes que puede hacer por su salud. La [employer] de los adultos deben seguir estas pautas: Education officer, environmental, al menos, 150 minutos de actividad fsica por semana. El ejercicio debe aumentar la frecuencia cardaca y Media planner transpirar (ejercicio de intensidad moderada). Hacer ejercicios de fortalecimiento por lo [employer] por semana. Agregue esto a su plan de ejercicio de intensidad moderada. Pase menos tiempo sentada. Incluso la actividad fsica ligera puede ser beneficiosa. Controle sus niveles de colesterol y lpidos en la sangre Comience a realizarse anlisis de lpidos y Oncologist en la sangre a los 20 aos y luego reptalos cada 5 aos. Hgase controlar los niveles de colesterol con mayor frecuencia si: Sus niveles de lpidos y colesterol son altos. Es mayor de 40 aos. Presenta un alto riesgo de padecer enfermedades cardacas. Qu debo saber sobre las pruebas de deteccin del  cncer? Segn su historia clnica y sus antecedentes familiares, es posible que deba realizarse pruebas de deteccin del cncer en diferentes edades. Esto puede incluir pruebas de deteccin de lo siguiente: Cncer de mama. Cncer de cuello uterino. Cncer colorrectal. Cncer de piel. Cncer de pulmn. Qu debo saber sobre la enfermedad cardaca, la diabetes y la hipertensin arterial? Presin arterial y enfermedad cardaca La hipertensin arterial causa enfermedades cardacas y [place] el riesgo de accidente cerebrovascular. Es ms probable que esto se manifieste en las personas que tienen lecturas de presin arterial alta o tienen sobrepeso. Hgase controlar la presin arterial: Cada 3 a 5 aos si tiene entre 18 y 50 aos. Todos los aos si es mayor de 40 aos. Diabetes Realcese exmenes de deteccin de la diabetes con regularidad. Este anlisis revisa el nivel de azcar en la sangre en [place]. Hgase las pruebas de deteccin: Cada tres aos despus de los 40 aos de edad si tiene un peso normal y un bajo riesgo de padecer diabetes. Con ms frecuencia y a partir de [place] edad inferior si tiene sobrepeso o un alto riesgo de padecer diabetes. Qu debo saber sobre la prevencin de infecciones? Hepatitis B Si tiene un riesgo ms alto de contraer hepatitis B, debe someterse a un examen de deteccin de este virus. Hable con el mdico para averiguar si tiene riesgo de contraer la infeccin por hepatitis B. Hepatitis C Se recomienda el anlisis a: [employer] 1945 y 1965. Todas las personas que tengan un riesgo de haber contrado hepatitis C. Enfermedades de transmisin sexual (ETS) Hgase las pruebas de Airline pilot de ITS, incluidas la gonorrea y la clamidia, si: Es sexualmente activa y es Adult nurse de 24  aos. Es mayor de 24 aos, y el mdico le informa que corre riesgo de tener este tipo de infecciones. La actividad sexual ha cambiado desde que le hicieron la ltima prueba de  deteccin y tiene un riesgo mayor de Warehouse manager clamidia o Copy. Pregntele al mdico si usted tiene riesgo. Pregntele al mdico si usted tiene un alto riesgo de Primary school teacher VIH. El mdico tambin puede recomendarle un medicamento recetado para ayudar a evitar la infeccin por el VIH. Si elige tomar medicamentos para prevenir el VIH, primero debe [employer] de deteccin del VIH. Luego debe hacerse anlisis cada 3 meses mientras est tomando los medicamentos. Embarazo Si est por dejar de Armed forces training and education officer (fase premenopusica) y usted puede quedar [place], busque asesoramiento antes de [place]. [place] de 400 a 800 microgramos (mcg) de cido [employer] si [place]. Pida mtodos de control de la natalidad (anticonceptivos) si desea evitar un embarazo no deseado. Osteoporosis y [place] La osteoporosis es una enfermedad en la que los huesos pierden los minerales y la fuerza por el avance de la edad. El resultado pueden ser fracturas en los [place]. Si tiene 65 aos o ms, o si est en riesgo de sufrir osteoporosis y fracturas, pregunte a su mdico si debe: Hacerse pruebas de deteccin de prdida sea. Tomar un suplemento de calcio o de vitamina D para reducir el riesgo de fracturas. Recibir terapia de reemplazo hormonal (TRH) para tratar los sntomas de la menopausia. Siga estas indicaciones en su casa: Consumo de alcohol No beba alcohol si: Su mdico le indica no hacerlo. Est embarazada, puede estar embarazada o est tratando de [place]. Si bebe alcohol: Limite la cantidad que bebe a lo siguiente: De 0 a 1 bebida por da. Sepa cunta cantidad de alcohol hay en las bebidas que toma. En los [address], una medida equivale a una botella de cerveza de 12 oz (355 ml), un vaso de vino de 5 oz (148 ml) o un vaso de una bebida alcohlica de alta graduacin de 1 oz (44 ml). Estilo de vida No consuma ningn producto que contenga nicotina o tabaco. Estos  productos incluyen cigarrillos, tabaco para Theatre manager y aparatos de vapeo, como los Administrator, Civil Service. Si necesita ayuda para dejar de consumir estos productos, consulte al mdico. No consuma drogas. No comparta agujas. Solicite ayuda a su mdico si necesita apoyo o informacin para abandonar las drogas. Indicaciones generales Realcese los estudios de rutina de [address], dentales y de Wellsite geologist. Mantngase al da con las vacunas. Infrmele a su mdico si: Se siente deprimida con frecuencia. Alguna vez ha sido vctima de [place] o no se siente seguro en su casa. Resumen Adoptar un estilo de vida saludable y recibir atencin preventiva son importantes para promover la salud y Counsellor. Siga las instrucciones del mdico acerca de una dieta saludable, el ejercicio y la realizacin de pruebas o exmenes para Hotel manager. Siga las instrucciones del mdico con respecto al control del colesterol y la presin arterial. Esta informacin no tiene Theme park manager el consejo del mdico. Asegrese de hacerle al mdico cualquier pregunta que tenga. Document Revised: 11/06/2020 Document Reviewed: 11/06/2020 Elsevier Patient Education  2024 [employer].

## 2023-11-30 NOTE — Assessment & Plan Note (Signed)
 Upper endoscopy report from 2022 reviewed. Positive GERD

## 2023-11-30 NOTE — Progress Notes (Signed)
 Ame Congo 58 y.o.   Chief Complaint  Patient presents with   Annual Exam    Patient here for physical, patient states wanting labs done. No other concerns     HISTORY OF PRESENT ILLNESS: This is a 58 y.o. female here for annual exam and follow-up on multiple chronic medical conditions  HPI   Prior to Admission medications   Medication Sig Start Date End Date Taking? Authorizing Provider  atorvastatin  (LIPITOR) 40 MG tablet TAKE 1 TABLET(40 MG) BY MOUTH DAILY 05/09/23  Yes Arnulfo Batson, Isidro Margo, MD  Calcium  Carbonate-Vitamin D  (CALTRATE 600+D PO) Take by mouth.   Yes [provider]  metFORMIN  (GLUCOPHAGE ) 500 MG tablet TAKE 1 TABLET(500 MG) BY MOUTH TWICE DAILY WITH A MEAL 04/11/23  Yes Dearia Wilmouth, Isidro Margo, MD  nystatin  (MYCOSTATIN /NYSTOP ) powder Apply 1 Application topically 2 (two) times daily. 08/25/22  Yes Lavoie, Marie-Lyne, MD  oxybutynin  (DITROPAN  XL) 5 MG 24 hr tablet Take 1 tablet (5 mg total) by mouth at bedtime. 08/03/22  Yes Ailine Hefferan Jose, MD  oxyCODONE -acetaminophen (PERCOCET) 10-325 MG tablet Take 1 tablet by mouth every 8 (eight) hours as needed. 08/24/22  Yes [provider]  pantoprazole  (PROTONIX ) 40 MG tablet Take 1 tablet (40 mg total) by mouth daily. 12/20/22  Yes Hazael Olveda Jose, MD  naloxone [facility]) nasal spray 4 mg/0.1 mL Place into the nose. Patient not taking: Reported on 11/30/2023 07/29/20   [provider]  pregabalin  (LYRICA ) 50 MG capsule Take 50 mg by mouth 2 (two) times daily. Patient not taking: Reported on 11/30/2023    [provider]    Allergies  Allergen Reactions   Latex Swelling   Other     LATEX, kiwi, papaya, fish   Sulfa  Antibiotics Other (See Comments)   Macrodantin  [Nitrofurantoin ] Rash and Other (See Comments)    Rash, pain behind eyes    Patient Active Problem List   Diagnosis Date Noted   Lower urinary tract symptoms 08/03/2022   Overactive bladder 08/03/2022    External hemorrhoids 10/20/2021   PTSD (post-traumatic stress disorder) 10/07/2021   GAD (generalized anxiety disorder) 07/29/2021   Severe episode of recurrent major depressive disorder, without psychotic features (HCC) 07/29/2021   Atherosclerosis of aorta (HCC) 03/16/2021   Diverticulosis 03/16/2021   Chronic depression 03/16/2021   Abnormal cortisol level 10/01/2020   History of [place]ushing disease 08/27/2020   Gastroesophageal reflux disease with esophagitis without hemorrhage 08/27/2020   Chronic pain syndrome 08/27/2020   History of fatty infiltration of liver 08/27/2020   Hepatic steatosis 01/16/2019   Cushingoid facies 10/17/2018   Acanthosis nigricans 10/17/2018   Hypertriglyceridemia 10/17/2018   Adenomatous colon polyp    Degenerative disc disease, cervical    Fatty liver disease, nonalcoholic    Prediabetes    Vitamin D  deficiency    Lumbosacral radiculopathy 06/04/2012   Pain, chronic due to trauma 06/04/2012   Chronic constipation 09/29/2011   History of colonic polyps 09/29/2011   Fibromyalgia    Hypercholesterolemia    DYSPEPSIA, CHRONIC 11/06/2008   Dyslipidemia 09/15/2007    Past Medical History:  Diagnosis Date   Acid reflux    Adenomatous colon polyp    2012   Allergy    Anxiety    Constipation, chronic    Degenerative disc disease, cervical    s/p epidural injections   Depression    Fatty liver disease, nonalcoholic    per ultrasound   Fibromyalgia    Hypercholesterolemia    Morphea  NSVD (normal spontaneous vaginal delivery)    X2   Prediabetes    a1c 6.1   Seizures (HCC)    with dental procedure   Vitamin D  deficiency     Past Surgical History:  Procedure Laterality Date   BACK SURGERY  2008   BREAST SURGERY     REDUCTIVE MAMMOPLASTY   herniated disc     LAPAROSCOPIC CHOLECYSTECTOMY     LUMBAR DISC SURGERY     L-4 AND L-5    PELVIC LAPAROSCOPY     BTSP   TONSILLECTOMY AND ADENOIDECTOMY     VAGINAL HYSTERECTOMY     Post  Repair/TVT Sling    Social History   Socioeconomic History   Marital status: Married    Spouse name: Not on file   Number of children: 2   Years of education: Not on file   Highest education level: GED or equivalent  Occupational History    Employer: [employer]  Tobacco Use   Smoking status: Never   Smokeless tobacco: Never  Vaping Use   Vaping status: Never Used  Substance and Sexual Activity   Alcohol use: No   Drug use: No   Sexual activity: Not Currently    Partners: Male    Birth control/protection: Surgical    Comment: 1st intercourse- 15, partners- 2, married- 29 yrs, hysterectomy  Other Topics Concern   Not on file  Social History Narrative   Not on file   Social Drivers of Health   Financial Resource Strain: Medium Risk (11/30/2023)   Overall Financial Resource Strain (CARDIA)    Difficulty of Paying Living Expenses: Somewhat hard  Food Insecurity: Food Insecurity Present (11/30/2023)   Hunger Vital Sign    Worried About Running Out of Food in the Last Year: Sometimes true    Ran Out of Food in the Last Year: Sometimes true  Transportation Needs: No Transportation Needs (11/30/2023)   PRAPARE - Administrator, Civil Service (Medical): No    Lack of Transportation (Non-Medical): No  Physical Activity: Insufficiently Active (11/30/2023)   Exercise Vital Sign    Days of Exercise per Week: 7 days    Minutes of Exercise per Session: 10 min  Stress: No Stress Concern Present (11/30/2023)   [employer] of Occupational Health - Occupational Stress Questionnaire    Feeling of Stress: Only a little  Social Connections: Moderately Isolated (11/30/2023)   Social Connection and Isolation Panel    Frequency of Communication with Friends and Family: Once a week    Frequency of Social Gatherings with Friends and Family: Twice a week    Attends Religious Services: Never    Database administrator or Organizations: No    Attends Banker  Meetings: Never    Marital Status: Married  Catering manager Violence: Not At Risk (11/30/2023)   Humiliation, Afraid, Rape, and Kick questionnaire    Fear of Current or Ex-Partner: No    Emotionally Abused: No    Physically Abused: No    Sexually Abused: No    Family History  Problem Relation Age of Onset   Hypertension Sister    Cancer Sister        colon ca... stepsister   Colon cancer Sister    Esophageal cancer Neg Hx    Stomach cancer Neg Hx    Rectal cancer Neg Hx      Review of Systems  Constitutional: Negative.  Negative for chills and fever.  HENT: Negative.  Negative for congestion and sore throat.   Respiratory: Negative.  Negative for cough and shortness of breath.   Cardiovascular: Negative.  Negative for chest pain and palpitations.  Gastrointestinal:  Positive for heartburn. Negative for abdominal pain, diarrhea, nausea and vomiting.  Genitourinary:  Positive for frequency. Negative for dysuria and hematuria.  Musculoskeletal:  Positive for back pain, joint pain and neck pain.  Skin: Negative.  Negative for rash.  Neurological: Negative.  Negative for dizziness and headaches.  All other systems reviewed and are negative.   Vitals:   11/30/23 1056  BP: 118/72  Pulse: 86  Temp: 98.4 F (36.9 C)  SpO2: 99%    Physical Exam Vitals reviewed.  Constitutional:      Appearance: Normal appearance.  HENT:     Head: Normocephalic.     Right Ear: Tympanic membrane, ear canal and external ear normal.     Left Ear: Tympanic membrane, ear canal and external ear normal.     Mouth/Throat:     Mouth: Mucous membranes are moist.     Pharynx: Oropharynx is clear.   Eyes:     Extraocular Movements: Extraocular movements intact.     Conjunctiva/sclera: Conjunctivae normal.     Pupils: Pupils are equal, round, and reactive to light.    Cardiovascular:     Rate and Rhythm: Normal rate and regular rhythm.     Pulses: Normal pulses.     Heart sounds: Normal heart  sounds.  Pulmonary:     Effort: Pulmonary effort is normal.     Breath sounds: Normal breath sounds.  Abdominal:     Palpations: Abdomen is soft.     Tenderness: There is no abdominal tenderness.   Musculoskeletal:     Cervical back: No tenderness.     Right lower leg: No edema.     Left lower leg: No edema.  Lymphadenopathy:     Cervical: No cervical adenopathy.   Skin:    General: Skin is warm and dry.     Capillary Refill: Capillary refill takes less than 2 seconds.   Neurological:     General: No focal deficit present.     Mental Status: She is alert and oriented to person, place, and time.   Psychiatric:        Mood and Affect: Mood normal.        Behavior: Behavior normal.      ASSESSMENT & PLAN: Problem List Items Addressed This Visit       Cardiovascular and Mediastinum   Atherosclerosis of aorta (HCC)   Diet and nutrition discussed. Continue atorvastatin  40 mg daily       Relevant Orders   Lipid panel     Digestive   Fatty liver disease, nonalcoholic   Chronic. Diet and nutrition discussed. Continue atorvastatin  40 mg daily.       Relevant Orders   Comprehensive metabolic panel with GFR   Lipid panel   Gastroesophageal reflux disease with esophagitis without hemorrhage   Upper endoscopy report from 2022 reviewed. Positive GERD         Nervous and Auditory   Lumbosacral radiculopathy   Chronic intermittent pain affecting quality of life        Genitourinary   Overactive bladder   Intermittent symptoms Continues Ditropan  5 mg at bedtime      Relevant Orders   Urinalysis   Urine Culture     Other   Dyslipidemia   Cardiovascular risks associated with dyslipidemia discussed. Diet and  nutrition discussed. Chronic stable condition. Continue atorvastatin  40 mg daily.       Relevant Orders   Comprehensive metabolic panel with GFR   Lipid panel   Prediabetes   Diet and nutrition discussed.  Continue metformin  500 mg twice a  day Hemoglobin A1c done today.      Relevant Orders   Hemoglobin A1c   Chronic pain syndrome   Stable.  On Lyrica  and oxycodone . Goes to pain management clinic regularly Pain medications handled by their office      Other Visit Diagnoses       Encounter for general adult medical examination with abnormal findings    -  Primary   Relevant Orders   Comprehensive metabolic panel with GFR   CBC with Differential/Platelet   Hemoglobin A1c   Lipid panel     Screening for deficiency anemia       Relevant Orders   CBC with Differential/Platelet     Screening for endocrine, metabolic and immunity disorder       Relevant Orders   Comprehensive metabolic panel with GFR      Modifiable risk factors discussed with patient. Anticipatory guidance according to age provided. The following topics were also discussed: Social Determinants of Health Smoking.  Non-smoker Diet and nutrition Benefits of exercise Cancer screening and review of most recent mammogram and colonoscopy reports Vaccinations review and recommendations Cardiovascular risk assessment Mental health including depression and anxiety Fall and accident prevention  Patient Instructions  Mantenimiento de Radiographer, therapeutic en las mujeres Health Maintenance, Female Adoptar un estilo de vida saludable y recibir atencin preventiva son importantes para promover la salud y Counsellor. Consulte al mdico sobre: El esquema adecuado para hacerse pruebas y exmenes peridicos. Cosas que puede hacer por su cuenta para prevenir enfermedades y [place] sano. Qu debo saber sobre la dieta, el peso y el ejercicio? Consuma una dieta saludable  Consuma una dieta que incluya muchas verduras, frutas, productos lcteos con bajo contenido de grasa y protenas magras. No consuma muchos alimentos ricos en grasas slidas, azcares agregados o sodio. Mantenga un peso saludable El ndice de masa muscular [facility]) se [place] para identificar problemas  de [place]. Proporciona una estimacin de la grasa corporal basndose en el peso y la altura. Su mdico puede ayudarle a determinar su [facility] y a Personnel officer o Pharmacologist un peso saludable. Haga ejercicio con regularidad Haga ejercicio con regularidad. Esta es una de las prcticas ms importantes que puede hacer por su salud. La [employer] de los adultos deben seguir estas pautas: Education officer, environmental, al menos, 150 minutos de actividad fsica por semana. El ejercicio debe aumentar la frecuencia cardaca y Media planner transpirar (ejercicio de intensidad moderada). Hacer ejercicios de fortalecimiento por lo [employer] [employer] por semana. Agregue esto a su plan de ejercicio de intensidad moderada. Pase menos tiempo sentada. Incluso la actividad fsica ligera puede ser beneficiosa. Controle sus niveles de colesterol y lpidos en la sangre Comience a realizarse anlisis de lpidos y Oncologist en la sangre a los 20 aos y luego reptalos cada 5 aos. Hgase controlar los niveles de colesterol con mayor frecuencia si: Sus niveles de lpidos y colesterol son altos. Es mayor de 40 aos. Presenta un alto riesgo de padecer enfermedades cardacas. Qu debo saber sobre las pruebas de deteccin del cncer? Segn su historia clnica y sus antecedentes familiares, es posible que deba realizarse pruebas de deteccin del cncer en diferentes edades. Esto puede incluir pruebas de deteccin de lo siguiente: Cncer de mama. Cncer de cuello  uterino. Cncer colorrectal. Cncer de piel. Cncer de pulmn. Qu debo saber sobre la enfermedad cardaca, la diabetes y la hipertensin arterial? Presin arterial y enfermedad cardaca La hipertensin arterial causa enfermedades cardacas y [place] el riesgo de accidente cerebrovascular. Es ms probable que esto se manifieste en las personas que tienen lecturas de presin arterial alta o tienen sobrepeso. Hgase controlar la presin arterial: Cada 3 a 5 aos si tiene entre 18 y 75 aos. Todos los aos si  es mayor de 40 aos. Diabetes Realcese exmenes de deteccin de la diabetes con regularidad. Este anlisis revisa el nivel de azcar en la sangre en [place]. Hgase las pruebas de deteccin: Cada tres aos despus de los 40 aos de edad si tiene un peso normal y un bajo riesgo de padecer diabetes. Con ms frecuencia y a partir de [place] edad inferior si tiene sobrepeso o un alto riesgo de padecer diabetes. Qu debo saber sobre la prevencin de infecciones? Hepatitis B Si tiene un riesgo ms alto de contraer hepatitis B, debe someterse a un examen de deteccin de este virus. Hable con el mdico para averiguar si tiene riesgo de contraer la infeccin por hepatitis B. Hepatitis C Se recomienda el anlisis a: [employer] 1945 y 1965. Todas las personas que tengan un riesgo de haber contrado hepatitis C. Enfermedades de transmisin sexual (ETS) Hgase las pruebas de Airline pilot de ITS, incluidas la gonorrea y la clamidia, si: Es sexualmente activa y es menor de [address]. Es mayor de [address], y Public affairs consultant informa que corre riesgo de tener este tipo de infecciones. La actividad sexual ha cambiado desde que le hicieron la ltima prueba de deteccin y tiene un riesgo mayor de tener clamidia o Copy. Pregntele al mdico si usted tiene riesgo. Pregntele al mdico si usted tiene un alto riesgo de Primary school teacher VIH. El mdico tambin puede recomendarle un medicamento recetado para ayudar a evitar la infeccin por el VIH. Si elige tomar medicamentos para prevenir el VIH, primero debe [employer] de deteccin del VIH. Luego debe hacerse anlisis cada 3 meses mientras est tomando los medicamentos. Embarazo Si est por dejar de menstruar (fase premenopusica) y usted puede quedar embarazada, busque asesoramiento antes de quedar embarazada. Tome de 400 a 800 microgramos (mcg) de cido flico todos los das si queda embarazada. Pida mtodos de control de la natalidad (anticonceptivos) si  desea evitar un embarazo no deseado. Osteoporosis y [place] La osteoporosis es una enfermedad en la que los huesos pierden los minerales y la fuerza por el avance de la edad. El resultado pueden ser fracturas en los [place]. Si tiene 65 aos o ms, o si est en riesgo de sufrir osteoporosis y fracturas, pregunte a su mdico si debe: Hacerse pruebas de deteccin de prdida sea. Tomar un suplemento de calcio o de vitamina D para reducir el riesgo de fracturas. Recibir terapia de reemplazo hormonal (TRH) para tratar los sntomas de la menopausia. Siga estas indicaciones en su casa: Consumo de alcohol No beba alcohol si: Su mdico le indica no hacerlo. Est embarazada, puede estar embarazada o est tratando de quedar embarazada. Si bebe alcohol: Limite la cantidad que bebe a lo siguiente: De 0 a 1 bebida por da. Sepa cunta cantidad de alcohol hay en las bebidas que toma. En los [address], una medida equivale a una botella de cerveza de 12 oz (355 ml), un vaso de vino de 5 oz (148 ml) o un vaso de una bebida alcohlica de  alta graduacin de 1 oz (44 ml). Estilo de vida No consuma ningn producto que contenga nicotina o tabaco. Estos productos incluyen cigarrillos, tabaco para Theatre manager y aparatos de vapeo, como los cigarrillos electrnicos. Si necesita ayuda para dejar de consumir estos productos, consulte al mdico. No consuma drogas. No comparta agujas. Solicite ayuda a su mdico si necesita apoyo o informacin para abandonar las drogas. Indicaciones generales Realcese los estudios de rutina de [address], dentales y de Wellsite geologist. Mantngase al da con las vacunas. Infrmele a su mdico si: Se siente deprimida con frecuencia. Alguna vez ha sido vctima de maltrato o no se siente seguro en su casa. Resumen Adoptar un estilo de vida saludable y recibir atencin preventiva son importantes para promover la salud y Counsellor. Siga las instrucciones del mdico acerca de una dieta  saludable, el ejercicio y la realizacin de pruebas o exmenes para Hotel manager. Siga las instrucciones del mdico con respecto al control del colesterol y la presin arterial. Esta informacin no tiene Theme park manager el consejo del mdico. Asegrese de hacerle al mdico cualquier pregunta que tenga. Document Revised: 11/06/2020 Document Reviewed: 11/06/2020 Elsevier Patient Education  2024 Elsevier Inc.     Maryagnes Small, MD [facility]

## 2023-11-30 NOTE — Progress Notes (Signed)
 Subjective:   [name] is a 58 y.o. who presents for a Medicare Wellness preventive visit.  As a reminder, Annual Wellness Visits don't include a physical exam, and some assessments may be limited, especially if this visit is performed virtually. We may recommend an in-person follow-up visit with your provider if needed.  Visit Complete: In person  Persons Participating in Visit: Patient.  AWV Questionnaire: Yes: Patient Medicare AWV questionnaire was completed by the patient on 11/29/2023; I have confirmed that all information answered by patient is correct and no changes since this date.  Cardiac Risk Factors include: advanced age (>21men, >22 women);dyslipidemia     Objective:    Today's Vitals   11/30/23 1011  BP: 132/82  Pulse: 94  SpO2: 97%  Weight: 165 lb (74.8 kg)  Height: 5' 1 (1.549 m)   Body mass index is 31.18 kg/m.     11/30/2023   10:10 AM  Advanced Directives  Does Patient Have a Medical Advance Directive? No  Would patient like information on creating a medical advance directive? No - Patient declined    Current Medications (verified) Outpatient Encounter Medications as of 11/30/2023  Medication Sig   atorvastatin  (LIPITOR) 40 MG tablet TAKE 1 TABLET(40 MG) BY MOUTH DAILY   Calcium  Carbonate-Vitamin D  (CALTRATE 600+D PO) Take by mouth.   metFORMIN  (GLUCOPHAGE ) 500 MG tablet TAKE 1 TABLET(500 MG) BY MOUTH TWICE DAILY WITH A MEAL   nystatin  (MYCOSTATIN /NYSTOP ) powder Apply 1 Application topically 2 (two) times daily.   oxybutynin  (DITROPAN  XL) 5 MG 24 hr tablet Take 1 tablet (5 mg total) by mouth at bedtime.   oxyCODONE -acetaminophen (PERCOCET) 10-325 MG tablet Take 1 tablet by mouth every 8 (eight) hours as needed.   pantoprazole  (PROTONIX ) 40 MG tablet Take 1 tablet (40 mg total) by mouth daily.   naloxone (NARCAN) nasal spray 4 mg/0.1 mL Place into the nose. (Patient not taking: Reported on 11/30/2023)   pregabalin  (LYRICA ) 50 MG capsule  Take 50 mg by mouth 2 (two) times daily. (Patient not taking: Reported on 11/30/2023)   No facility-administered encounter medications on file as of 11/30/2023.    Allergies (verified) Latex, Other, Sulfa  antibiotics, and Macrodantin  [nitrofurantoin ]   History: Past Medical History:  Diagnosis Date   Acid reflux    Adenomatous colon polyp    2012   Allergy    Anxiety    Constipation, chronic    Degenerative disc disease, cervical    s/p epidural injections   Depression    Fatty liver disease, nonalcoholic    per ultrasound   Fibromyalgia    Hypercholesterolemia    Morphea    NSVD (normal spontaneous vaginal delivery)    X2   Prediabetes    a1c 6.1   Seizures (HCC)    with dental procedure   Vitamin D  deficiency    Past Surgical History:  Procedure Laterality Date   BACK SURGERY  2008   BREAST SURGERY     REDUCTIVE MAMMOPLASTY   herniated disc     LAPAROSCOPIC CHOLECYSTECTOMY     LUMBAR DISC SURGERY     L-4 AND L-5    PELVIC LAPAROSCOPY     BTSP   TONSILLECTOMY AND ADENOIDECTOMY     VAGINAL HYSTERECTOMY     Post Repair/TVT Sling   Family History  Problem Relation Age of Onset   Hypertension Sister    Cancer Sister        colon ca... stepsister   Colon cancer Sister  Esophageal cancer Neg Hx    Stomach cancer Neg Hx    Rectal cancer Neg Hx    Social History   Socioeconomic History   Marital status: Married    Spouse name: Not on file   Number of children: 2   Years of education: Not on file   Highest education level: GED or equivalent  Occupational History    Employer: [employer]  Tobacco Use   Smoking status: Never   Smokeless tobacco: Never  Vaping Use   Vaping status: Never Used  Substance and Sexual Activity   Alcohol use: No   Drug use: No   Sexual activity: Not Currently    Partners: Male    Birth control/protection: Surgical    Comment: 1st intercourse- 15, partners- 2, married- 29 yrs, hysterectomy  Other Topics Concern    Not on file  Social History Narrative   Not on file   Social Drivers of Health   Financial Resource Strain: Medium Risk (11/30/2023)   Overall Financial Resource Strain (CARDIA)    Difficulty of Paying Living Expenses: Somewhat hard  Food Insecurity: Food Insecurity Present (11/30/2023)   Hunger Vital Sign    Worried About Running Out of Food in the Last Year: Sometimes true    Ran Out of Food in the Last Year: Sometimes true  Transportation Needs: No Transportation Needs (11/30/2023)   PRAPARE - Administrator, Civil Service (Medical): No    Lack of Transportation (Non-Medical): No  Physical Activity: Insufficiently Active (11/30/2023)   Exercise Vital Sign    Days of Exercise per Week: 7 days    Minutes of Exercise per Session: 10 min  Stress: No Stress Concern Present (11/30/2023)   [employer] of Occupational Health - Occupational Stress Questionnaire    Feeling of Stress: Only a little  Social Connections: Moderately Isolated (11/30/2023)   Social Connection and Isolation Panel    Frequency of Communication with Friends and Family: Once a week    Frequency of Social Gatherings with Friends and Family: Twice a week    Attends Religious Services: Never    Database administrator or Organizations: No    Attends Engineer, structural: Never    Marital Status: Married    Tobacco Counseling Counseling given: No    Clinical Intake:  Pre-visit preparation completed: Yes  Pain : No/denies pain     BMI - recorded: 31.18 Nutritional Status: BMI > 30  Obese Nutritional Risks: None Diabetes: No  Lab Results  Component Value Date   HGBA1C 6.4 08/03/2022   HGBA1C 6.3 03/03/2022   HGBA1C 6.5 09/28/2021     How often do you need to have someone help you when you read instructions, pamphlets, or other written materials from your doctor or pharmacy?: 1 - Never  Interpreter Needed?: No  Information entered by :: Kandy Orris, CMA   Activities of  Daily Living     11/30/2023   10:16 AM 11/29/2023    1:35 PM  In your present state of health, do you have any difficulty performing the following activities:  Hearing? 0 0  Vision? 0 0  Difficulty concentrating or making decisions? 0 0  Walking or climbing stairs? 1 1  Dressing or bathing? 1 1  Comment Spouse helps due to stiffness of joints   Doing errands, shopping? 1 1  Comment Spouse helps   Preparing Food and eating ? N N  Using the Toilet? N N  In the  past six months, have you accidently leaked urine? Y Y  Comment sometimes wears a pad   Do you have problems with loss of bowel control? Y Y  Comment sometimes wears a pad - only for long travel   Managing your Medications? N N  Managing your Finances? N N  Housekeeping or managing your Housekeeping? Y   Comment Spouse helps     Patient Care Team: Elvira Hammersmith, MD as PCP - General (Internal Medicine) Tobin Forts, MD as Consulting Physician (Gastroenterology) Domenica Fried, MD (Pain Medicine) Amada Jun, MD as Referring Physician (Ophthalmology) Gearl Keens, MD as Consulting Physician (Neurosurgery)  I have updated your Care Teams any recent Medical Services you may have received from other providers in the past year.     Assessment:   This is a routine wellness examination for [name].  Hearing/Vision screen Hearing Screening - Comments:: Denies hearing difficulties   Vision Screening - Comments:: Wears rx glasses - up to date with routine eye exams with Dr [facility] of [facility]   Goals Addressed               This Visit's Progress     Patient Stated (pt-stated)        Patient stated she is staying active        Depression Screen     11/30/2023   10:18 AM 08/03/2022    8:42 AM 08/03/2022    8:38 AM 03/03/2022    8:06 AM 09/28/2021   10:38 AM 07/29/2021   10:54 AM 08/27/2020    8:10 AM  PHQ 2/9 Scores  PHQ - 2 Score 0 0 0 0 0  3  PHQ- 9 Score 3 0  0   13     Information is  confidential and restricted. Go to Review Flowsheets to unlock data.    Fall Risk     11/30/2023   10:18 AM 11/29/2023    1:35 PM 12/24/2022    1:37 PM 08/25/2022    9:08 AM 08/03/2022    8:38 AM  Fall Risk   Falls in the past year? 0 0 0 0 0  Number falls in past yr: 0 0 0 0 0  Injury with Fall? 0  0 0 0  Risk for fall due to : No Fall Risks  No Fall Risks No Fall Risks No Fall Risks  Follow up Falls evaluation completed;Falls prevention discussed  Falls evaluation completed Falls evaluation completed Falls evaluation completed    MEDICARE RISK AT HOME:  Medicare Risk at Home Any stairs in or around the home?: No If so, are there any without handrails?: No Home free of loose throw rugs in walkways, pet beds, electrical cords, etc?: Yes Adequate lighting in your home to reduce risk of falls?: Yes Life alert?: No Use of a cane, walker or w/c?: Yes Grab bars in the bathroom?: No Shower chair or bench in shower?: Yes Elevated toilet seat or a handicapped toilet?: No  TIMED UP AND GO:  Was the test performed?  No  Cognitive Function: 6CIT completed        11/30/2023   10:21 AM  6CIT Screen  What Year? 0 points  What month? 0 points  What time? 0 points  Count back from 20 0 points  Months in reverse 0 points  Repeat phrase 4 points  Total Score 4 points    Immunizations Immunization History  Administered Date(s) Administered   Influenza,inj,Quad PF,6+ Mos 02/28/2020  Influenza-Unspecified 02/27/2020   PFIZER(Purple Top)SARS-COV-2 Vaccination 08/23/2019, 09/13/2019, 05/15/2020   Td 09/15/2007   Tdap 10/22/2015   Zoster Recombinant(Shingrix ) 03/03/2022    Screening Tests Health Maintenance  Topic Date Due   Cervical Cancer Screening (Pap smear)  08/07/2020   COVID-19 Vaccine (4 - 2024-25 season) 02/13/2023   MAMMOGRAM  08/03/2023   Zoster Vaccines- Shingrix  (2 of 2) 03/01/2024 (Originally 04/28/2022)   INFLUENZA VACCINE  01/13/2024   Medicare Annual Wellness  (AWV)  11/29/2024   DTaP/Tdap/Td (3 - Td or Tdap) 10/21/2025   Colonoscopy  03/11/2031   Hepatitis C Screening  Completed   HIV Screening  Completed   HPV VACCINES  Aged Out   Meningococcal B Vaccine  Aged Out    Health Maintenance  Health Maintenance Due  Topic Date Due   Cervical Cancer Screening (Pap smear)  08/07/2020   COVID-19 Vaccine (4 - 2024-25 season) 02/13/2023   MAMMOGRAM  08/03/2023   Health Maintenance Items Addressed:  Mammogram scheduled, Referral to Gynecologist, Dr Orson Blalock for a pap smear.  Additional Screening:  Vision Screening: Recommended annual ophthalmology exams for early detection of glaucoma and other disorders of the eye. Would you like a referral to an eye doctor? No  Patient stated she's had an eye exam w/Dr Venus Ginsberg of [facility] in [place], [place] (located inside of [address]) in 2025 and f/u appt in 12/2023.  Dental Screening: Recommended annual dental exams for proper oral hygiene  Community Resource Referral / Chronic Care Management: CRR required this visit?  VBCI Referral due to financial insecurities (Utilities/Food)  CCM required this visit?  No   Plan:    I have personally reviewed and noted the following in the patient's chart:   Medical and social history Use of alcohol, tobacco or illicit drugs  Current medications and supplements including opioid prescriptions. Patient is not currently taking opioid prescriptions. Functional ability and status Nutritional status Physical activity Advanced directives List of other physicians Hospitalizations, surgeries, and ER visits in previous 12 months Vitals Screenings to include cognitive, depression, and falls Referrals and appointments  In addition, I have reviewed and discussed with patient certain preventive protocols, quality metrics, and best practice recommendations. A written personalized care plan for preventive services as well as general preventive  health recommendations were provided to patient.   Patria Bookbinder, CMA   11/30/2023   After Visit Summary: (In Person-Printed) AVS printed and given to the patient  Notes: See Routing notes

## 2023-12-01 LAB — URINE CULTURE: Result:: NO GROWTH

## 2023-12-07 DIAGNOSIS — G894 Chronic pain syndrome: Secondary | ICD-10-CM | POA: Diagnosis not present

## 2023-12-07 DIAGNOSIS — R202 Paresthesia of skin: Secondary | ICD-10-CM | POA: Diagnosis not present

## 2023-12-07 DIAGNOSIS — M5412 Radiculopathy, cervical region: Secondary | ICD-10-CM | POA: Diagnosis not present

## 2023-12-08 DIAGNOSIS — M961 Postlaminectomy syndrome, not elsewhere classified: Secondary | ICD-10-CM | POA: Diagnosis not present

## 2023-12-08 DIAGNOSIS — G5601 Carpal tunnel syndrome, right upper limb: Secondary | ICD-10-CM | POA: Diagnosis not present

## 2023-12-08 DIAGNOSIS — M4722 Other spondylosis with radiculopathy, cervical region: Secondary | ICD-10-CM | POA: Diagnosis not present

## 2023-12-08 DIAGNOSIS — M5412 Radiculopathy, cervical region: Secondary | ICD-10-CM | POA: Diagnosis not present

## 2023-12-08 DIAGNOSIS — M47816 Spondylosis without myelopathy or radiculopathy, lumbar region: Secondary | ICD-10-CM | POA: Diagnosis not present

## 2023-12-08 DIAGNOSIS — M5417 Radiculopathy, lumbosacral region: Secondary | ICD-10-CM | POA: Diagnosis not present

## 2023-12-08 DIAGNOSIS — M797 Fibromyalgia: Secondary | ICD-10-CM | POA: Diagnosis not present

## 2023-12-12 ENCOUNTER — Telehealth: Payer: Self-pay

## 2023-12-12 NOTE — Progress Notes (Signed)
   Telephone encounter was:  Unsuccessful.  12/12/2023 Name: Ruby Congo MRN: [id number] DOB: 01-Apr-1966  Unsuccessful outbound call made today to assist with:  Food Insecurity  Outreach Attempt:  1st Attempt  No answer and unable to leave a message   Jon Colt [facility]  [facility] Guide, Phone: [phone] Fax: [phone] Website: [email or web address]

## 2023-12-13 ENCOUNTER — Telehealth: Payer: Self-pay

## 2023-12-13 NOTE — Progress Notes (Signed)
   Telephone encounter was:  Successful.  Complex Care Management Note Care Guide Note  12/13/2023 Name: [name] MRN: [id number] DOB: 01/19/1966  [name] is a 58 y.o. year old female who is a primary care patient of Sagardia, Emil Schanz, MD . The community resource team was consulted for assistance with Food Insecurity  SDOH screenings and interventions completed:  No        Care guide performed the following interventions: Patient provided with information about care guide support team and interviewed to confirm resource needs.Pt request I call back tomorrow   Follow Up Plan:  Care guide will follow up with patient by phone over the next day  Encounter Outcome:  Patient Request to Call Back    Jon Colt [facility]  [facility] Guide, Phone: [phone] Fax: [phone] Website: [email or web address]

## 2023-12-15 ENCOUNTER — Telehealth: Payer: Self-pay

## 2023-12-15 ENCOUNTER — Encounter: Payer: Self-pay | Admitting: Emergency Medicine

## 2023-12-15 NOTE — Telephone Encounter (Signed)
 Means urine is very diluted.  Nothing to worry about

## 2023-12-15 NOTE — Telephone Encounter (Signed)
 She was referred to gynecology.  Please look into this.

## 2023-12-15 NOTE — Progress Notes (Signed)
   Telephone encounter was:  Unsuccessful.  12/15/2023 Name: Ramesha Congo MRN: [id number] DOB: [birth year]  Unsuccessful outbound call made today to assist with:  Food Insecurity  Outreach Attempt:  3rd Attempt.  Referral closed unable to contact patient.  A HIPAA compliant voice message was left requesting a return call.  Instructed patient to call back.   Jon Colt [facility]  [facility] Guide, Phone: [phone] Fax: [phone] Website: [email or web address]

## 2023-12-30 DIAGNOSIS — Z981 Arthrodesis status: Secondary | ICD-10-CM | POA: Diagnosis not present

## 2023-12-30 DIAGNOSIS — M47816 Spondylosis without myelopathy or radiculopathy, lumbar region: Secondary | ICD-10-CM | POA: Diagnosis not present

## 2023-12-30 DIAGNOSIS — M47814 Spondylosis without myelopathy or radiculopathy, thoracic region: Secondary | ICD-10-CM | POA: Diagnosis not present

## 2024-01-07 DIAGNOSIS — H1013 Acute atopic conjunctivitis, bilateral: Secondary | ICD-10-CM | POA: Diagnosis not present

## 2024-02-06 DIAGNOSIS — L810 Postinflammatory hyperpigmentation: Secondary | ICD-10-CM | POA: Diagnosis not present

## 2024-02-06 DIAGNOSIS — D229 Melanocytic nevi, unspecified: Secondary | ICD-10-CM | POA: Diagnosis not present

## 2024-02-06 DIAGNOSIS — R21 Rash and other nonspecific skin eruption: Secondary | ICD-10-CM | POA: Diagnosis not present

## 2024-02-08 DIAGNOSIS — F411 Generalized anxiety disorder: Secondary | ICD-10-CM | POA: Diagnosis not present

## 2024-02-08 DIAGNOSIS — F329 Major depressive disorder, single episode, unspecified: Secondary | ICD-10-CM | POA: Diagnosis not present

## 2024-02-08 DIAGNOSIS — G894 Chronic pain syndrome: Secondary | ICD-10-CM | POA: Diagnosis not present

## 2024-02-17 NOTE — Progress Notes (Unsigned)
 GYNECOLOGY  VISIT   HPI: 58 y.o.   Married  Hispanic female   [phone] with No LMP recorded. Patient has had a hysterectomy.   here for: Feels like she is having more bladder prolapse.     Spanish interpretor present.     Having a lot of urinary frequency and urgency. Voids 20 times a day.  Maps out restrooms. No enuresis.  Did not take Ditropan  XL.  No leak with cough, laugh, or sneeze.  Can leak urine with standing up.   Not voiding well.  Has chronic constipation due to use of pain medication to treat chronic back and neck pain. Takes Miralax, which works.    No fecal incontinence.   Hysterectomy done for sterilization per patient.   Had a TVH, posterior colporrhaphy, and TV sling on chart review.   Not sexually active.   Has vaginal dryness.  GYNECOLOGIC HISTORY: No LMP recorded. Patient has had a hysterectomy. Contraception:  hyst  Menopausal hormone therapy:  n/a Last 2 paps:  08/08/19 neg, 05/03/11 neg  History of abnormal Pap or positive HPV:  no Mammogram:  08/02/22 Breast Density Cat A, BIRADS Cat 1 neg.  Has appt at [facility] in October.           OB History     Gravida  2   Para  2   Term  2   Preterm      AB  0   Living  2      SAB      IAB      Ectopic  0   Multiple      Live Births  2              Patient Active Problem List   Diagnosis Date Noted   Lower urinary tract symptoms 08/03/2022   Overactive bladder 08/03/2022   External hemorrhoids 10/20/2021   PTSD (post-traumatic stress disorder) 10/07/2021   GAD (generalized anxiety disorder) 07/29/2021   Severe episode of recurrent major depressive disorder, without psychotic features (HCC) 07/29/2021   Atherosclerosis of aorta (HCC) 03/16/2021   Diverticulosis 03/16/2021   Chronic depression 03/16/2021   Abnormal cortisol level 10/01/2020   History of Cushing disease 08/27/2020   Gastroesophageal reflux disease with esophagitis without hemorrhage 08/27/2020   Chronic  pain syndrome 08/27/2020   History of fatty infiltration of liver 08/27/2020   Hepatic steatosis 01/16/2019   Cushingoid facies 10/17/2018   Acanthosis nigricans 10/17/2018   Hypertriglyceridemia 10/17/2018   Adenomatous colon polyp    Degenerative disc disease, cervical    Fatty liver disease, nonalcoholic    Prediabetes    Vitamin D  deficiency    Lumbosacral radiculopathy 06/04/2012   Pain, chronic due to trauma 06/04/2012   Chronic constipation 09/29/2011   History of colonic polyps 09/29/2011   Fibromyalgia    Hypercholesterolemia    DYSPEPSIA, CHRONIC 11/06/2008   Dyslipidemia 09/15/2007    Past Medical History:  Diagnosis Date   Acid reflux    Adenomatous colon polyp    2012   Allergy    Anxiety    Constipation, chronic    Degenerative disc disease, cervical    s/p epidural injections   Depression    Fatty liver disease, nonalcoholic    per ultrasound   Fibromyalgia    Hypercholesterolemia    Morphea    NSVD (normal spontaneous vaginal delivery)    X2   Prediabetes    a1c 6.1   Seizures (HCC)  with dental procedure   Vitamin D  deficiency     Past Surgical History:  Procedure Laterality Date   BACK SURGERY  2008   BREAST SURGERY     REDUCTIVE MAMMOPLASTY   herniated disc     LAPAROSCOPIC CHOLECYSTECTOMY     LUMBAR DISC SURGERY     L-4 AND L-5    PELVIC LAPAROSCOPY     BTSP   TONSILLECTOMY AND ADENOIDECTOMY     VAGINAL HYSTERECTOMY     Post Repair/TVT Sling    Current Outpatient Medications  Medication Sig Dispense Refill   atorvastatin  (LIPITOR) 40 MG tablet TAKE 1 TABLET(40 MG) BY MOUTH DAILY 90 tablet 3   Calcium  Carbonate-Vitamin D  (CALTRATE 600+D PO) Take by mouth.     metFORMIN  (GLUCOPHAGE ) 500 MG tablet TAKE 1 TABLET(500 MG) BY MOUTH TWICE DAILY WITH A MEAL 180 tablet 1   naloxone (NARCAN) nasal spray 4 mg/0.1 mL Place into the nose.     oxyCODONE -acetaminophen (PERCOCET) 10-325 MG tablet Take 1 tablet by mouth every 8 (eight) hours as  needed.     pantoprazole  (PROTONIX ) 40 MG tablet Take 1 tablet (40 mg total) by mouth daily. 90 tablet 3   pregabalin  (LYRICA ) 50 MG capsule Take 50 mg by mouth 2 (two) times daily.     nystatin  (MYCOSTATIN /NYSTOP ) powder Apply 1 Application topically 2 (two) times daily. (Patient not taking: Reported on 02/22/2024) 15 g 4   oxybutynin  (DITROPAN  XL) 5 MG 24 hr tablet Take 1 tablet (5 mg total) by mouth at bedtime. (Patient not taking: Reported on 02/22/2024) 30 tablet 3   No current facility-administered medications for this visit.     ALLERGIES: Latex, Other, Sulfa  antibiotics, and Macrodantin  [nitrofurantoin ]  Family History  Problem Relation Age of Onset   Hypertension Sister    Cancer Sister        colon ca... stepsister   Colon cancer Sister    Esophageal cancer Neg Hx    Stomach cancer Neg Hx    Rectal cancer Neg Hx     Social History   Socioeconomic History   Marital status: Married    Spouse name: Not on file   Number of children: 2   Years of education: Not on file   Highest education level: GED or equivalent  Occupational History    Employer: [employer]  Tobacco Use   Smoking status: Never   Smokeless tobacco: Never  Vaping Use   Vaping status: Never Used  Substance and Sexual Activity   Alcohol use: No   Drug use: No   Sexual activity: Not Currently    Partners: Male    Birth control/protection: Surgical    Comment: 1st intercourse- 15, partners- 2, married- 29 yrs, hysterectomy  Other Topics Concern   Not on file  Social History Narrative   Not on file   Social Drivers of Health   Financial Resource Strain: Medium Risk (11/30/2023)   Overall Financial Resource Strain (CARDIA)    Difficulty of Paying Living Expenses: Somewhat hard  Food Insecurity: Food Insecurity Present (11/30/2023)   Hunger Vital Sign    Worried About Running Out of Food in the Last Year: Sometimes true    Ran Out of Food in the Last Year: Sometimes true  Transportation  Needs: No Transportation Needs (11/30/2023)   PRAPARE - Administrator, Civil Service (Medical): No    Lack of Transportation (Non-Medical): No  Physical Activity: Insufficiently Active (11/30/2023)   Exercise Vital Sign  Days of Exercise per Week: 7 days    Minutes of Exercise per Session: 10 min  Stress: No Stress Concern Present (11/30/2023)   [employer] of Occupational Health - Occupational Stress Questionnaire    Feeling of Stress: Only a little  Social Connections: Moderately Isolated (11/30/2023)   Social Connection and Isolation Panel    Frequency of Communication with Friends and Family: Once a week    Frequency of Social Gatherings with Friends and Family: Twice a week    Attends Religious Services: Never    Database administrator or Organizations: No    Attends Banker Meetings: Never    Marital Status: Married  Catering manager Violence: Not At Risk (11/30/2023)   Humiliation, Afraid, Rape, and Kick questionnaire    Fear of Current or Ex-Partner: No    Emotionally Abused: No    Physically Abused: No    Sexually Abused: No    Review of Systems  See HPI.   PHYSICAL EXAMINATION:   BP 114/68 (BP Location: Left Arm, Patient Position: Sitting)   Pulse 94   SpO2 96%     General appearance: alert, cooperative and appears stated age   Abdomen: soft, non-tender, no masses,  no organomegaly  Pelvic: External genitalia:  no lesions              Urethra:  normal appearing urethra with no masses, tenderness or lesions              Bartholins and Skenes: normal                 Vagina: normal appearing vagina with normal color and discharge, no lesions.  Urethra well supported.  First degree high cystocele noted.  Good apical and posterior vaginal support.               Cervix:  absent                Bimanual Exam:  Uterus:  absent.               Adnexa: no mass, fullness, tenderness              Rectal exam: yes.  Confirms.              Anus:   normal sphincter tone, no lesions  Patient examined standing.  First degree high cystocele noted.   Chaperone was present for exam:  Kari HERO, CMA  ASSESSMENT:  Status post TVT, posterior colporrhaphy, TVT midurethral sling.  First degree high cystocele.  Overactive bladder.  Chronic constipation.   Chronic pain.  Uses narcotics.  PLAN:  Prolapse discussed.  Risk factors discussed:  childbearing, chronic straining/coughing, prior prolapse.  We discussed pelvic floor therapy and pessary use.  Patient declines tx.   Declines Myrbetriq.  Weight loss encouraged.  Fu in 6 months for annual exam and recheck of prolapse.   35 min  total time was spent for this patient encounter, including preparation, face-to-face counseling with the patient, coordination of care, and documentation of the encounter.

## 2024-02-22 ENCOUNTER — Encounter: Payer: Self-pay | Admitting: Obstetrics and Gynecology

## 2024-02-22 ENCOUNTER — Ambulatory Visit: Admitting: Obstetrics and Gynecology

## 2024-02-22 VITALS — BP 114/68 | HR 94

## 2024-02-22 DIAGNOSIS — N8110 Cystocele, unspecified: Secondary | ICD-10-CM

## 2024-02-22 DIAGNOSIS — N3281 Overactive bladder: Secondary | ICD-10-CM | POA: Diagnosis not present

## 2024-02-26 NOTE — Patient Instructions (Addendum)
 Vejiga hiperactiva en adultos: qu debe saber Overactive Bladder in Adults: What to Know  La vejiga hiperactiva (VH) ocurre cuando uno tiene dificultad para contener la orina. Puede sentir la necesidad de Geographical information systems officer con frecuencia o de repente. Tambin puede tener fugas de orina si no puede llegar al bao con la rapidez suficiente. Estos sntomas pueden obstaculizar su vida diaria. Cules son las causas? La VH puede deberse a un problema en las seales nerviosas entre la vejiga y Secretary/administrator. La vejiga puede recibir la seal de vaciarse antes de que est llena. O bien, la vejiga puede ser demasiado sensible. Esto puede hacer que se comprima demasiado pronto. Otras causas son: Problemas mdicos, como: Una infeccin de las vas urinarias (IU). Hinchazn o clculos en la vejiga. Un tumor, que es un crecimiento de clulas que no es normal. Diabetes. Debilidad muscular o nerviosa. Las causas de esto pueden ser las siguientes: Una lesin en la mdula espinal. Un accidente cerebrovascular. Esclerosis mltiple. Ciruga en tejidos cercanos. Consumo de cafena o alcohol. Algunos medicamentos. Estos incluyen los que reducen la cantidad de lquido en el cuerpo. Dificultades para defecar, lo que tambin se llama estreimiento. Qu incrementa el riesgo? Usted puede correr ms riesgo si: Es Psychiatrist. Est atravesando la menopausia. Tiene la prstata grande o problemas con la prstata. Tiene sobrepeso. Fuma. Ingiere alimentos o bebidas que irritan la vejiga. Estos pueden incluir el t y los alimentos muy condimentados. Cules son los signos o sntomas? Necesidad repentina e intensa de Geographical information systems officer. Orinar 8 o ms veces al [employer]. Tener fugas de comoros. Levantarse para orinar 2 o ms veces por noche. Cmo se diagnostica? Se puede diagnosticar en funcin de los sntomas, los antecedentes mdicos y un examen. Tambin pueden hacerle pruebas. Pueden incluir: Anlisis de sangre. Pruebas de orina para  determinar si hay infeccin. Pruebas para estudiar el flujo de la comoros. Estas se llaman pruebas urodinmicas. Tambin es posible que deba consultar a un experto en problemas de la vejiga llamado urlogo. Cmo se trata? El tratamiento depende de la causa y de la gravedad de sus sntomas. Puede incluir lo siguiente: Entrenamiento de la vejiga, por ejemplo: Aprender a controlar la necesidad imperiosa de orinar al orinar a ciertas horas o momentos. Haga los ejercicios de Kegel. Estos pueden ayudarlo a [employer] que inician y detienen el flujo de comoros. Dispositivos especiales, por ejemplo: Biorretroalimentacin. Este tratamiento usa  sensores para ayudarlo a aprender [employer]. Estimulacin elctrica. Este tratamiento usa  electricidad para [employer] nervios y los msculos de la vejiga funcionen. Un pesario. Esto puede introducirse en la vagina para sostener la vejiga. Medicamentos para: Warehouse manager una infeccin urinaria (IU). Evitar que la vejiga libere orina en el momento incorrecto. Calmar los msculos de la vejiga. Ciruga para: Ayudar a las [employer] que controlan la comoros. Cambiar la forma de la vejiga. Esto se realiza solamente en [employer] graves. Siga estas instrucciones en su casa: Comida y bebida  [employer] cambios en su dieta que le hayan indicado. Es posible que deba hacer lo siguiente: Product manager pequeas cantidades de lquido Freight forwarder de grandes cantidades de una vez. Consumir menos cafena o alcohol. Tomar medidas para tratar o prevenir los problemas para defecar. Posiblemente deba consumir alimentos ricos en fibra, como legumbres, cereales integrales, y frutas y verduras frescas. Estilo de vida Baje de [place], si es necesario. No fume, vapee ni consuma nicotina o tabaco. Instrucciones generales Tome los medicamentos nicamente segn las indicaciones. Si le han  recetado antibiticos, tmelos como se lo hayan indicado. No deje de tomarlos  aunque comience a sentirse mejor. Use los dispositivos como se lo hayan indicado. Si es necesario, use apsitos para absorber cualquier fuga de orina que pueda [place]. Lleve un registro de cunto bebe, cundo bebe y cundo aparece la necesidad de Geographical information systems officer. Comunquese con un mdico si: Los sntomas no mejoran con Scientist, research (medical). No puede controlar la vejiga. Tiene fiebre o siente escalofros. Esta informacin no tiene Theme park manager el consejo del mdico. Asegrese de hacerle al mdico cualquier pregunta que tenga. Document Revised: 03/28/2023 Document Reviewed: 03/28/2023 Elsevier Patient Education  2024 Elsevier Inc.  Protrusin o cada de rganos plvicos (prolapso de rganos plvicos): qu hay que saber Pelvic Organs That Bulge or Drop (Pelvic Organ Prolapse): What to Know  El prolapso de rganos plvicos se produce cuando los rganos de la pelvis se estiran, protruyen o caen en una posicin que no es normal. Se produce cuando los msculos y tejidos que sostienen los rganos plvicos se debilitan o se estiran. Hay varios tipos de prolapso de rganos plvicos. Estos son: Prolapso de la vagina. Prolapso del tero. Prolapso de la vejiga. Prolapso del recto (rectocele). Prolapso intestinal (enterocele). Cuando rganos distintos de la vagina estn involucrados, a menudo protruyen hacia la vagina o sobresalen de ella. La gravedad del prolapso depende de cunto sobresalgan los rganos. Cules son las causas? Embarazo, trabajo de parto y parto. Lesin o ciruga previa del suelo plvico. Menopausia. Se trata de una etapa de la vida en la que la mujer deja de [place]. Enfermedades genticas que [employer] y los tejidos. Tener obesidad. Problemas crnicos para defecar (estreimiento crnico). Histerectoma. Es cuando se extrae el tero. Tabaquismo, enfermedad pulmonar y tos crnica. Levantar peso excesivo. Ejercicios pesados que [employer] y las  articulaciones. Cules son los signos o sntomas? Perder un poco de orina al toser, Engineering geologist, hacer esfuerzos o Company secretary. A esto se le llama incontinencia. Puede empeorar justo despus de dar a Patent examiner. Puede que mejore con [place]. Sensacin de presin en la pelvis o la vagina. Puede sentir ms presin al toser o al defecar. Un bulto que sobresale de la abertura de la vagina o en la vagina misma. Problemas para Automotive engineer. Dolor en la parte inferior de la espalda. Dolor, Associate Professor o prdidas de orina durante [address]. Tener ms de un tipo de infeccin de vejiga, tambin llamada infeccin de las vas [place]. Algunos pacientes no presentan sntomas. Cmo se diagnostica? Un prolapso puede diagnosticarse a partir de un examen de la vagina o de las nalgas. Durante el examen, pueden pedirle que tosa y que haga un esfuerzo mientras est acostada, sentada y de pie.  Tambin pueden realizarse pruebas de la funcin vesical. Cmo se trata? El tratamiento del prolapso de rganos plvicos depende de los sntomas. El tratamiento puede incluir: Cambios en el estilo de vida, como beber mucho lquido y comer alimentos ricos en [place]. Esto hace que sea ms fcil defecar. Orinar a determinadas horas. Esto se llama entrenamiento de la vejiga. Puede ser til si tiene prdidas de comoros. Estrgeno. Puede ser til para fortalecer los msculos del suelo plvico. Ejercicios. Los ejercicios de Nature conservation officer y tensar los msculos del suelo plvico. El yoga y el pilates pueden fortalecer los msculos del vientre. Biorretroalimentacin. Se trata de hacer fisioterapia y tensar los msculos del suelo plvico con un determinado dispositivo. Estimuladores del suelo plvico. Estos dispositivos fortalecen los msculos del suelo plvico si  no puede hacer los ejercicios de Kegel. Pesario Se trata de un dispositivo blando y flexible para sostener las paredes de la vagina. Mantiene los rganos plvicos en su  sitio. Ciruga. Esto puede ser necesario para tratar un prolapso muy grave. Siga las siguientes instrucciones en el hogar: Actividad Pierda peso segn lo indicado. Evite levantar objetos pesados y hacer esfuerzos cuando se ejercite o trabaje. No contenga la respiracin cuando haga ejercicio o levante peso. Limite el resto de sus actividades segn lo indicado. Haga los ejercicios de Kegel segn lo indicado. Instrucciones generales [employer] segn lo indicado. Utilice una compresa o paales para adultos si tiene prdidas de comoros. Si tiene un pesario, cudelo segn lo indicado. Comunquese con un mdico si: Tiene sntomas que se interponen en sus actividades diarias o en su vida sexual. Ladora sangra la vagina y fuera del perodo. Tiene fiebre, flujo vaginal maloliente u otros signos de infeccin. Siente dolor o sangra al [employer]. Tiene dificultad para defecar. El pesario se Quarry manager. Tiene otro tipo de dolor bajo en el vientre. Solicite ayuda de inmediato si: No puede orinar. Esta informacin no tiene Theme park manager el consejo del mdico. Asegrese de hacerle al mdico cualquier pregunta que tenga. Document Revised: 06/22/2023 Document Reviewed: 06/22/2023 Elsevier Patient Education  2025 [employer].

## 2024-03-08 DIAGNOSIS — K5903 Drug induced constipation: Secondary | ICD-10-CM | POA: Diagnosis not present

## 2024-03-08 DIAGNOSIS — M4722 Other spondylosis with radiculopathy, cervical region: Secondary | ICD-10-CM | POA: Diagnosis not present

## 2024-03-08 DIAGNOSIS — M47816 Spondylosis without myelopathy or radiculopathy, lumbar region: Secondary | ICD-10-CM | POA: Diagnosis not present

## 2024-03-08 DIAGNOSIS — G5601 Carpal tunnel syndrome, right upper limb: Secondary | ICD-10-CM | POA: Diagnosis not present

## 2024-03-08 DIAGNOSIS — M797 Fibromyalgia: Secondary | ICD-10-CM | POA: Diagnosis not present

## 2024-03-20 DIAGNOSIS — G5601 Carpal tunnel syndrome, right upper limb: Secondary | ICD-10-CM | POA: Diagnosis not present

## 2024-03-29 DIAGNOSIS — Z1231 Encounter for screening mammogram for malignant neoplasm of breast: Secondary | ICD-10-CM | POA: Diagnosis not present

## 2024-03-29 LAB — HM MAMMOGRAPHY

## 2024-04-19 DIAGNOSIS — M797 Fibromyalgia: Secondary | ICD-10-CM | POA: Diagnosis not present

## 2024-04-19 DIAGNOSIS — M5416 Radiculopathy, lumbar region: Secondary | ICD-10-CM | POA: Diagnosis not present

## 2024-05-09 ENCOUNTER — Other Ambulatory Visit: Payer: Self-pay | Admitting: Emergency Medicine

## 2024-05-09 DIAGNOSIS — K2100 Gastro-esophageal reflux disease with esophagitis, without bleeding: Secondary | ICD-10-CM

## 2024-05-18 ENCOUNTER — Encounter: Payer: Self-pay | Admitting: Emergency Medicine

## 2024-05-18 DIAGNOSIS — H579 Unspecified disorder of eye and adnexa: Secondary | ICD-10-CM

## 2024-05-18 NOTE — Telephone Encounter (Signed)
 Please advise

## 2024-05-18 NOTE — Telephone Encounter (Signed)
 Referral was placed

## 2024-06-02 LAB — LAB REPORT - SCANNED
Albumin, Urine POC: <0.2
Creatinine, POC: 12 mg/dL
EGFR: 107

## 2024-08-20 ENCOUNTER — Ambulatory Visit: Admitting: Obstetrics and Gynecology

## 2024-08-22 ENCOUNTER — Ambulatory Visit: Admitting: Obstetrics and Gynecology

## 2024-11-13 ENCOUNTER — Encounter: Admitting: Obstetrics and Gynecology

## 2024-12-04 ENCOUNTER — Encounter: Admitting: Emergency Medicine

## 2024-12-05 ENCOUNTER — Ambulatory Visit
